# Patient Record
Sex: Male | Born: 1950 | Race: Black or African American | Hispanic: No | Marital: Married | Smoking: Never smoker
Health system: Southern US, Community
[De-identification: ages and names within clinical notes are randomized; demographics above are authoritative.]

## PROBLEM LIST (undated history)

## (undated) DIAGNOSIS — U071 COVID-19: Secondary | ICD-10-CM

## (undated) DIAGNOSIS — I10 Essential (primary) hypertension: Secondary | ICD-10-CM

## (undated) DIAGNOSIS — N183 Chronic kidney disease, stage 3 unspecified: Secondary | ICD-10-CM

## (undated) DIAGNOSIS — I739 Peripheral vascular disease, unspecified: Secondary | ICD-10-CM

## (undated) DIAGNOSIS — E7800 Pure hypercholesterolemia, unspecified: Secondary | ICD-10-CM

## (undated) DIAGNOSIS — C61 Malignant neoplasm of prostate: Secondary | ICD-10-CM

## (undated) HISTORY — PX: THYROIDECTOMY: SHX17

## (undated) HISTORY — PX: HERNIA REPAIR: SHX51

## (undated) HISTORY — PX: TONSILLECTOMY: SUR1361

## (undated) HISTORY — DX: COVID-19: U07.1

## (undated) HISTORY — DX: Peripheral vascular disease, unspecified: I73.9

---

## 2009-04-29 ENCOUNTER — Inpatient Hospital Stay (HOSPITAL_COMMUNITY): Admission: EM | Admit: 2009-04-29 | Discharge: 2009-05-01 | Payer: Self-pay | Admitting: Emergency Medicine

## 2010-05-14 HISTORY — PX: COLONOSCOPY W/ POLYPECTOMY: SHX1380

## 2010-08-14 LAB — CBC
HCT: 41.7 % (ref 39.0–52.0)
MCHC: 34.8 g/dL (ref 30.0–36.0)
MCV: 94.3 fL (ref 78.0–100.0)
RBC: 4.42 MIL/uL (ref 4.22–5.81)
WBC: 12.2 10*3/uL — ABNORMAL HIGH (ref 4.0–10.5)

## 2010-08-14 LAB — POCT I-STAT, CHEM 8
Calcium, Ion: 1.15 mmol/L (ref 1.12–1.32)
Creatinine, Ser: 1.4 mg/dL (ref 0.4–1.5)
Glucose, Bld: 116 mg/dL — ABNORMAL HIGH (ref 70–99)
Hemoglobin: 14.3 g/dL (ref 13.0–17.0)
TCO2: 31 mmol/L (ref 0–100)

## 2010-08-14 LAB — BASIC METABOLIC PANEL
BUN: 10 mg/dL (ref 6–23)
CO2: 29 mEq/L (ref 19–32)
Calcium: 9.5 mg/dL (ref 8.4–10.5)
Chloride: 99 mEq/L (ref 96–112)
Creatinine, Ser: 1.33 mg/dL (ref 0.4–1.5)
GFR calc Af Amer: >60 mL/min (ref >60)
GFR calc non Af Amer: 55 mL/min — ABNORMAL LOW (ref >60)
Glucose, Bld: 120 mg/dL — ABNORMAL HIGH (ref 70–99)
Potassium: 4.7 mEq/L (ref 3.5–5.1)
Sodium: 136 mEq/L (ref 135–145)

## 2010-08-14 LAB — DIFFERENTIAL
Basophils Absolute: 0 10*3/uL (ref 0.0–0.1)
Basophils Relative: 0 % (ref 0–1)
Eosinophils Absolute: 0 10*3/uL (ref 0.0–0.7)
Eosinophils Relative: 0 % (ref 0–5)
Lymphocytes Relative: 6 % — ABNORMAL LOW (ref 12–46)
Lymphs Abs: 0.8 10*3/uL (ref 0.7–4.0)
Monocytes Absolute: 0.1 10*3/uL (ref 0.1–1.0)
Monocytes Relative: 1 % — ABNORMAL LOW (ref 3–12)
Neutro Abs: 11.3 10*3/uL — ABNORMAL HIGH (ref 1.7–7.7)
Neutrophils Relative %: 93 % — ABNORMAL HIGH (ref 43–77)

## 2010-08-14 LAB — MRSA PCR SCREENING: MRSA by PCR: NEGATIVE

## 2010-08-14 LAB — STREP A DNA PROBE: Group A Strep Probe: NEGATIVE

## 2010-08-27 ENCOUNTER — Emergency Department (HOSPITAL_COMMUNITY)
Admission: EM | Admit: 2010-08-27 | Discharge: 2010-08-27 | Disposition: A | Discharge status: Home / Self Care | Payer: Commercial Managed Care - PPO | Attending: Emergency Medicine | Admitting: Emergency Medicine

## 2010-08-27 DIAGNOSIS — R221 Localized swelling, mass and lump, neck: Secondary | ICD-10-CM | POA: Insufficient documentation

## 2010-08-27 DIAGNOSIS — R040 Epistaxis: Secondary | ICD-10-CM | POA: Insufficient documentation

## 2010-08-27 DIAGNOSIS — R220 Localized swelling, mass and lump, head: Secondary | ICD-10-CM | POA: Insufficient documentation

## 2010-08-27 DIAGNOSIS — I10 Essential (primary) hypertension: Secondary | ICD-10-CM | POA: Insufficient documentation

## 2010-08-27 DIAGNOSIS — E669 Obesity, unspecified: Secondary | ICD-10-CM | POA: Insufficient documentation

## 2010-10-05 ENCOUNTER — Ambulatory Visit (HOSPITAL_COMMUNITY)
Admission: RE | Admit: 2010-10-05 | Discharge: 2010-10-05 | Disposition: A | Discharge status: Home / Self Care | Payer: Commercial Managed Care - PPO | Source: Ambulatory Visit | Attending: Gastroenterology | Admitting: Gastroenterology

## 2010-10-05 DIAGNOSIS — Z1211 Encounter for screening for malignant neoplasm of colon: Secondary | ICD-10-CM | POA: Insufficient documentation

## 2010-10-05 DIAGNOSIS — I10 Essential (primary) hypertension: Secondary | ICD-10-CM | POA: Insufficient documentation

## 2010-10-12 NOTE — Op Note (Signed)
  NAME:  Kevin Merritt, SCERBO NO.:  [id number]  MEDICAL RECORD NO.:  [id number]           PATIENT TYPE:  O  LOCATION:  [facility]                         FACILITY:  [facility]  PHYSICIAN:  Danise Edge, M.D.   DATE OF BIRTH:  [birth year]  DATE OF PROCEDURE:  10/05/2010 DATE OF DISCHARGE:                              OPERATIVE REPORT   HISTORY:  Mr. [name] is a 60 year old male born 02/02/1951. The patient is scheduled to undergo his first screening colonoscopy with polypectomy to prevent colon cancer.  ENDOSCOPIST:  Danise Edge, M.D.  PREMEDICATION:  Versed 10 mg, fentanyl 100 mcg.  PROCEDURE IN DETAIL:  After obtaining informed consent, the patient was placed in the left lateral decubitus position.  Anal inspection and digital rectal examination were normal.  The Pentax pediatric colonoscope was introduced into the rectum and advanced to the cecum without difficulty.  A normal-appearing ileocecal valve and appendiceal orifice were identified.  The ileocecal valve was intubated and the distal ileum inspected.  Colonic preparation for the examination today was good.  Rectum normal.  Retroflex view of the distal rectum normal. Sigmoid colon and descending colon normal. Splenic flexure normal. Transverse colon normal. Hepatic flexure normal. Ascending colon normal. Cecum and ileocecal valve normal.  ASSESSMENT:  Normal screening proctocolonoscopy to the cecum.  RECOMMENDATIONS:  Repeat screening colonoscopy in 10 years.          ______________________________ Danise Edge, M.D.     MJ/MEDQ  D:  10/05/2010  T:  10/05/2010  Job:  [id number]  cc:   Deirdre Peer. Polite, M.D.  Electronically Signed by Danise Edge M.D. on 10/12/2010 05:28:35 PM

## 2011-06-09 ENCOUNTER — Other Ambulatory Visit: Payer: Self-pay

## 2011-06-09 ENCOUNTER — Emergency Department (HOSPITAL_COMMUNITY)
Admission: EM | Admit: 2011-06-09 | Discharge: 2011-06-09 | Disposition: A | Discharge status: Home / Self Care | Payer: Commercial Managed Care - PPO | Attending: Emergency Medicine | Admitting: Emergency Medicine

## 2011-06-09 ENCOUNTER — Encounter (HOSPITAL_COMMUNITY): Payer: Self-pay | Admitting: *Deleted

## 2011-06-09 DIAGNOSIS — Z79899 Other long term (current) drug therapy: Secondary | ICD-10-CM | POA: Insufficient documentation

## 2011-06-09 DIAGNOSIS — R51 Headache: Secondary | ICD-10-CM

## 2011-06-09 DIAGNOSIS — E7800 Pure hypercholesterolemia, unspecified: Secondary | ICD-10-CM | POA: Insufficient documentation

## 2011-06-09 DIAGNOSIS — I10 Essential (primary) hypertension: Secondary | ICD-10-CM | POA: Insufficient documentation

## 2011-06-09 HISTORY — DX: Essential (primary) hypertension: I10

## 2011-06-09 HISTORY — DX: Pure hypercholesterolemia, unspecified: E78.00

## 2011-06-09 LAB — BASIC METABOLIC PANEL
CO2: 28 mEq/L (ref 19–32)
Calcium: 9.6 mg/dL (ref 8.4–10.5)
Chloride: 100 mEq/L (ref 96–112)
Potassium: 4 mEq/L (ref 3.5–5.1)
Sodium: 137 mEq/L (ref 135–145)

## 2011-06-09 LAB — URINALYSIS, ROUTINE W REFLEX MICROSCOPIC
Glucose, UA: NEGATIVE mg/dL
Leukocytes, UA: NEGATIVE
Protein, ur: NEGATIVE mg/dL
Specific Gravity, Urine: 1.025 (ref 1.005–1.030)

## 2011-06-09 NOTE — ED Provider Notes (Signed)
History     CSN: [id number]  Arrival date & time 06/09/11  1042   First MD Initiated Contact with Patient 06/09/11 1056      Chief Complaint  Patient presents with  . Hypertension    (Consider location/radiation/quality/duration/timing/severity/associated sxs/prior treatment) HPI Patient presents with concerns of hypertension.  The patient has a history of this, is that he has been compliant with his medications, denies any recent changes.  He notes that over the past week, he gradually developed intermittent headache.  He also complains of intermittent palpable pounding in his ears.  He associates each of these changes with elevated blood pressure.  He denies any disorientation, visual changes, nausea, vomiting, chest pain, dyspnea, diarrhea, and gait changes.  No clear alleviating factors, even when the patient takes blood pressure medications.  No clear exacerbating factors either. The patient had a primary care physician appointment yesterday, this was canceled due to the inclement weather Past Medical History  Diagnosis Date  . Hypertension   . High cholesterol     Past Surgical History  Procedure Date  . Thyroidectomy   . Tonsillectomy   . Hernia repair     History reviewed. No pertinent family history.  History  Substance Use Topics  . Smoking status: Never Smoker   . Smokeless tobacco: Never Used  . Alcohol Use: No      Review of Systems  Constitutional:       Per HPI, otherwise negative  HENT:       Per HPI, otherwise negative  Eyes: Negative.   Respiratory:       Per HPI, otherwise negative  Cardiovascular:       Per HPI, otherwise negative  Gastrointestinal: Negative for vomiting.  Genitourinary: Negative.   Musculoskeletal:       Per HPI, otherwise negative  Skin: Negative.   Neurological: Negative for syncope.    Allergies  Shellfish allergy  Home Medications   Current Outpatient Rx  Name Route Sig Dispense Refill  . AMLODIPINE BESYLATE  10 MG PO TABS Oral Take 10 mg by mouth daily.    . ASPIRIN 325 MG PO TABS Oral Take 325 mg by mouth daily.    Marland Kitchen VALSARTAN-HYDROCHLOROTHIAZIDE 320-25 MG PO TABS Oral Take 1 tablet by mouth daily.      BP 148/87  Pulse 73  Temp(Src) 97.5 F (36.4 C) (Oral)  Resp 16  Ht 6' 5.5" (1.969 m)  Wt 260 lb (117.935 kg)  BMI 30.43 kg/m2  SpO2 99%  Physical Exam  Nursing note and vitals reviewed. Constitutional: He is oriented to person, place, and time. He appears well-developed. No distress.  HENT:  Head: Normocephalic and atraumatic.  Eyes: Conjunctivae and EOM are normal.  Fundoscopic exam:      The right eye shows no arteriolar narrowing, no exudate, no hemorrhage and no papilledema.       The left eye shows no arteriolar narrowing, no exudate, no hemorrhage and no papilledema.  Cardiovascular: Normal rate and regular rhythm.   Pulmonary/Chest: Effort normal. No stridor. No respiratory distress.  Abdominal: He exhibits no distension.  Musculoskeletal: He exhibits no edema.  Neurological: He is alert and oriented to person, place, and time. He has normal strength. No cranial nerve deficit or sensory deficit. He displays a negative Romberg sign.  Skin: Skin is warm and dry.  Psychiatric: He has a normal mood and affect.    ED Course  Procedures (including critical care time)  Labs Reviewed  BASIC METABOLIC PANEL -  Abnormal; Notable for the following:    Creatinine, Ser 1.41 (*)    GFR calc non Af Amer 52 (*)    GFR calc Af Amer 60 (*)    All other components within normal limits  URINALYSIS, ROUTINE W REFLEX MICROSCOPIC   No results found.   No diagnosis found.   Date: 06/09/2011  Rate: 75  Rhythm: normal sinus rhythm  QRS Axis: normal  Intervals: normal  ST/T Wave abnormalities: normal  Conduction Disutrbances:none  Narrative Interpretation:   Old EKG Reviewed: none available Borderline changes (atrial abnormality?) - Abnormal ECG   MDM  This generally  well-appearing male presents with concerns over hypertension and headache.  On exam he is in no distress, has no overt stigmata of sustained hypertension, with unremarkable vital signs.  The patient's labs are further reassuring for the absence of endorgan effects.  The patient was discharged in stable condition to return to his primary care physicians in 2 days.        Gerhard Munch, MD 06/09/11 1246

## 2011-06-09 NOTE — ED Notes (Signed)
Pt remains in triage at this moment. Not in room 13 yet

## 2011-06-09 NOTE — ED Notes (Signed)
Pt from home c/o elevated BP for the last couple days, had appt with PCP yesterday but rescheduled due to inclement weather, pt also endorses headache "off an on".

## 2011-06-09 NOTE — ED Notes (Signed)
MD (Dr. Lockwood) at bedside. 

## 2011-06-09 NOTE — ED Notes (Signed)
Report HTN x 2 days. Denied any symptoms. Latest BP is 158/88, HR 76, 99% RA, Resp 15. Asymptomatic. Denied any pain, sob. Pt has good perfusion.

## 2011-06-09 NOTE — ED Notes (Signed)
Pt now in room.   

## 2011-06-09 NOTE — ED Notes (Signed)
Pt aware of the need for urine 

## 2011-08-31 ENCOUNTER — Emergency Department (HOSPITAL_BASED_OUTPATIENT_CLINIC_OR_DEPARTMENT_OTHER)
Admission: EM | Admit: 2011-08-31 | Discharge: 2011-08-31 | Disposition: A | Discharge status: Home / Self Care | Payer: Commercial Managed Care - PPO | Attending: Emergency Medicine | Admitting: Emergency Medicine

## 2011-08-31 ENCOUNTER — Encounter (HOSPITAL_BASED_OUTPATIENT_CLINIC_OR_DEPARTMENT_OTHER): Payer: Self-pay | Admitting: *Deleted

## 2011-08-31 ENCOUNTER — Emergency Department (INDEPENDENT_AMBULATORY_CARE_PROVIDER_SITE_OTHER): Payer: Commercial Managed Care - PPO

## 2011-08-31 DIAGNOSIS — M25569 Pain in unspecified knee: Secondary | ICD-10-CM | POA: Insufficient documentation

## 2011-08-31 DIAGNOSIS — E789 Disorder of lipoprotein metabolism, unspecified: Secondary | ICD-10-CM | POA: Insufficient documentation

## 2011-08-31 DIAGNOSIS — M25859 Other specified joint disorders, unspecified hip: Secondary | ICD-10-CM

## 2011-08-31 DIAGNOSIS — M25561 Pain in right knee: Secondary | ICD-10-CM

## 2011-08-31 DIAGNOSIS — I10 Essential (primary) hypertension: Secondary | ICD-10-CM | POA: Insufficient documentation

## 2011-08-31 DIAGNOSIS — Z79899 Other long term (current) drug therapy: Secondary | ICD-10-CM | POA: Insufficient documentation

## 2011-08-31 DIAGNOSIS — Z7982 Long term (current) use of aspirin: Secondary | ICD-10-CM | POA: Insufficient documentation

## 2011-08-31 DIAGNOSIS — R209 Unspecified disturbances of skin sensation: Secondary | ICD-10-CM | POA: Insufficient documentation

## 2011-08-31 DIAGNOSIS — M25469 Effusion, unspecified knee: Secondary | ICD-10-CM | POA: Insufficient documentation

## 2011-08-31 DIAGNOSIS — M79609 Pain in unspecified limb: Secondary | ICD-10-CM

## 2011-08-31 DIAGNOSIS — M25559 Pain in unspecified hip: Secondary | ICD-10-CM

## 2011-08-31 MED ORDER — OXYCODONE-ACETAMINOPHEN 5-325 MG PO TABS: 1.0000 | ORAL_TABLET | ORAL | Status: AC | PRN

## 2011-08-31 MED ORDER — MELOXICAM 7.5 MG PO TABS: 7.5000 mg | ORAL_TABLET | Freq: Every day | ORAL | Status: AC

## 2011-08-31 NOTE — ED Provider Notes (Signed)
History     CSN: [id number]  Arrival date & time 08/31/11  0840   First MD Initiated Contact with Patient 08/31/11 [phone]      Chief Complaint  Patient presents with  . Leg Pain    right hip, knee and lower leg pain    (Consider location/radiation/quality/duration/timing/severity/associated sxs/prior treatment) Patient is a 61 y.o. male presenting with leg pain. The history is provided by the patient.  Leg Pain   He has been having pain in his right knee and right hip for the last 3 weeks. He has noted some intermittent swelling of his right knee. His pain is worse with walking and he does notice that his leg gets stiff sometimes. It is also noted some numbness in the first, second, and third toes of the right foot. Has not had any weakness. His job does involve a lot of standing and he has a history of sports injuries to his knee in the past but no recent trauma. Pain is moderate to severe. Current pain is rated at 8/10. Last night he was having difficulty sleeping because of pain. He is tight he is seeing a nonprescription knee support but it did not help and he stated that it felt like his knee was going to pop.  Past Medical History  Diagnosis Date  . Hypertension   . High cholesterol     Past Surgical History  Procedure Date  . Thyroidectomy   . Tonsillectomy   . Hernia repair     No family history on file.  History  Substance Use Topics  . Smoking status: Never Smoker   . Smokeless tobacco: Never Used  . Alcohol Use: No      Review of Systems  All other systems reviewed and are negative.    Allergies  Shellfish allergy  Home Medications   Current Outpatient Rx  Name Route Sig Dispense Refill  . LISINOPRIL 10 MG PO TABS Oral Take 10 mg by mouth daily.    Marland Kitchen AMLODIPINE BESYLATE 10 MG PO TABS Oral Take 10 mg by mouth daily.    . ASPIRIN 325 MG PO TABS Oral Take 325 mg by mouth daily.    Marland Kitchen VALSARTAN-HYDROCHLOROTHIAZIDE 320-25 MG PO TABS Oral Take 1 tablet by  mouth daily.      BP 166/98  Pulse 72  Temp(Src) 97.6 F (36.4 C) (Oral)  Resp 100  Ht 6\' 7"  (2.007 m)  Wt 260 lb (117.935 kg)  BMI 29.29 kg/m2  SpO2 100%  Physical Exam  Nursing note and vitals reviewed.  61 year old male who is resting complaint in no acute distress. Vital signs are significant for mild hypertension with blood pressure 166/98. Oxygen saturation is 100% which is normal. Head is normocephalic and atraumatic. PERRLA, EOMI. Oropharynx is clear. Neck is nontender and supple. Back is nontender. There's negative straight leg raise. Lungs are clear without rales, wheezes, rhonchi. Heart has regular rate and rhythm without murmur. Abdomen is soft, flat, nontender without masses or hepatosplenomegaly. Extremities: There is a small effusion present in the right knee. Full passive range of motion is present in the right hip and right knee. No crepitus is felt. There is a negative Lachman and McMurray's tests. Distal pulses are strong and capillary refill is prompt. Skin is warm and dry without rash. Neurologic: Mental status is normal, cranial nerves are intact, there are no focal motor or sensory deficits. Specifically, pinprick sensation in the toes of his right foot is normal.  ED Course  Procedures (including critical care time)  Results for orders placed during the hospital encounter of 06/09/11  BASIC METABOLIC PANEL      Component Value Range   Sodium 137  135 - 145 (mEq/L)   Potassium 4.0  3.5 - 5.1 (mEq/L)   Chloride 100  96 - 112 (mEq/L)   CO2 28  19 - 32 (mEq/L)   Glucose, Bld 93  70 - 99 (mg/dL)   BUN 19  6 - 23 (mg/dL)   Creatinine, Ser 1.61 (*) 0.50 - 1.35 (mg/dL)   Calcium 9.6  8.4 - 09.6 (mg/dL)   GFR calc non Af Amer 52 (*) >90 (mL/min)   GFR calc Af Amer 60 (*) >90 (mL/min)  URINALYSIS, ROUTINE W REFLEX MICROSCOPIC      Component Value Range   Color, Urine YELLOW  YELLOW    APPearance CLEAR  CLEAR    Specific Gravity, Urine 1.025  1.005 - 1.030    pH 5.5   5.0 - 8.0    Glucose, UA NEGATIVE  NEGATIVE (mg/dL)   Hgb urine dipstick NEGATIVE  NEGATIVE    Bilirubin Urine NEGATIVE  NEGATIVE    Ketones, ur NEGATIVE  NEGATIVE (mg/dL)   Protein, ur NEGATIVE  NEGATIVE (mg/dL)   Urobilinogen, UA 0.2  0.0 - 1.0 (mg/dL)   Nitrite NEGATIVE  NEGATIVE    Leukocytes, UA NEGATIVE  NEGATIVE    Dg Hip Complete Right  08/31/2011  *RADIOLOGY REPORT*  Clinical Data: Leg pain  RIGHT HIP - COMPLETE 2+ VIEW  Comparison: None.  Findings: Three views of the right hip submitted.  No acute fracture or subluxation.  Minimal narrowing superior joint space bilateral hip joints.  SI joints are unremarkable.  IMPRESSION: No acute fracture or subluxation.  Minimal narrowing of superior joint space bilateral hip joints.  Original Report Authenticated By: Natasha Mead, M.D.   Dg Knee 1-2 Views Right  08/31/2011  *RADIOLOGY REPORT*  Clinical Data: Knee pain  RIGHT KNEE - 1-2 VIEW  Comparison: None.  Findings: Two views of the right knee submitted.  No acute fracture or subluxation.  Mild narrowing of medial joint compartment. Minimal spurring of medial tibial plateau.  No joint effusion. Tiny probable tendinous calcifications are noted adjacent to lateral femoral condyle.  IMPRESSION: No acute fracture or subluxation.  Mild degenerative changes as described above.  No joint effusion.  Original Report Authenticated By: Natasha Mead, M.D.   X-rays show only mild degenerative changes. He'll be given a trial of NSAIDs and is given a prescription for meloxicam. He's also given a prescription for Percocet for pain. He sees his knee immobilizer on an as-needed basis.   1. Pain in right knee   2. Hypertension       MDM  Knee and hip pain which are most likely secondary to degenerative joint disease. He could have a meniscus tear in his knee as well. Plain x-rays will be obtained and I anticipate sending him home with a knee immobilizer for comfort and referral to  orthopedics.        Dione Booze, MD 08/31/11 1044

## 2011-08-31 NOTE — ED Notes (Signed)
Patient states he has right knee pain intermittently for a long time.  Over the last 2 weeks pain is in his right hip, right knee, right medial calf with numbness in his right foot.  No known injury.  Stands and works long hours at work.

## 2011-08-31 NOTE — Discharge Instructions (Signed)
Wear the immobilizer as needed. Talk with your doctor about possible referral to an orthopedic Dr. or sports medicine specialist.  Arthralgia Your caregiver has diagnosed you as suffering from an arthralgia. Arthralgia means there is pain in a joint. This can come from many reasons including:  Bruising the joint which causes soreness (inflammation) in the joint.   Wear and tear on the joints which occur as we grow older (osteoarthritis).   Overusing the joint.   Various forms of arthritis.   Infections of the joint.  Regardless of the cause of pain in your joint, most of these different pains respond to anti-inflammatory drugs and rest. The exception to this is when a joint is infected, and these cases are treated with antibiotics, if it is a bacterial infection. HOME CARE INSTRUCTIONS   Rest the injured area for as long as directed by your caregiver. Then slowly start using the joint as directed by your caregiver and as the pain allows. Crutches as directed may be useful if the ankles, knees or hips are involved. If the knee was splinted or casted, continue use and care as directed. If an stretchy or elastic wrapping bandage has been applied today, it should be removed and re-applied every 3 to 4 hours. It should not be applied tightly, but firmly enough to keep swelling down. Watch toes and feet for swelling, bluish discoloration, coldness, numbness or excessive pain. If any of these problems (symptoms) occur, remove the ace bandage and re-apply more loosely. If these symptoms persist, contact your caregiver or return to this location.   For the first 24 hours, keep the injured extremity elevated on pillows while lying down.   Apply ice for 15 to 20 minutes to the sore joint every couple hours while awake for the first half day. Then 3 to 4 times per day for the first 48 hours. Put the ice in a plastic bag and place a towel between the bag of ice and your skin.   Wear any splinting, casting,  elastic bandage applications, or slings as instructed.   Only take over-the-counter or prescription medicines for pain, discomfort, or fever as directed by your caregiver. Do not use aspirin immediately after the injury unless instructed by your physician. Aspirin can cause increased bleeding and bruising of the tissues.   If you were given crutches, continue to use them as instructed and do not resume weight bearing on the sore joint until instructed.  Persistent pain and inability to use the sore joint as directed for more than 2 to 3 days are warning signs indicating that you should see a caregiver for a follow-up visit as soon as possible. Initially, a hairline fracture (break in bone) may not be evident on X-rays. Persistent pain and swelling indicate that further evaluation, non-weight bearing or use of the joint (use of crutches or slings as instructed), or further X-rays are indicated. X-rays may sometimes not show a small fracture until a week or 10 days later. Make a follow-up appointment with your own caregiver or one to whom we have referred you. A radiologist (specialist in reading X-rays) may read your X-rays. Make sure you know how you are to obtain your X-ray results. Do not assume everything is normal if you do not hear from [place]. SEEK MEDICAL CARE IF: Bruising, swelling, or pain increases. SEEK IMMEDIATE MEDICAL CARE IF:   Your fingers or toes are numb or blue.   The pain is not responding to medications and continues to stay  the same or get worse.   The pain in your joint becomes severe.   You develop a fever over 102 F (38.9 C).   It becomes impossible to move or use the joint.  MAKE SURE YOU:   Understand these instructions.   Will watch your condition.   Will get help right away if you are not doing well or get worse.  Document Released: 04/30/2005 Document Revised: 04/19/2011 Document Reviewed: 12/17/2007 [facility] Patient Information 2012 [place],  [place].  Meloxicam tablets What is this medicine? MELOXICAM (mel OX i cam) is a non-steroidal anti-inflammatory drug (NSAID). It is used to reduce swelling and to treat pain. It may be used for osteoarthritis, rheumatoid arthritis, or juvenile rheumatoid arthritis. This medicine may be used for other purposes; ask your health care provider or pharmacist if you have questions. What should I tell my health care provider before I take this medicine? They need to know if you have any of these conditions: -asthma -cigarette smoker -coronary artery bypass graft (CABG) surgery within the past 2 weeks -drink more than 3 alcohol-containing drinks a day -heart disease or circulation problems such as heart failure or leg edema (fluid retention) -hemophilia or bleeding problems -high blood pressure -kidney disease -liver disease -stomach bleeding or ulcers -an unusual or allergic reaction to meloxicam, aspirin, other NSAIDs, other medicines, foods, dyes, or preservatives -pregnant or trying to get pregnant -breast-feeding How should I use this medicine? Take this medicine by mouth with a full glass of water. Follow the directions on the prescription label. Take this medicine in an upright or sitting position. If possible take bedtime doses at least 10 minutes before lying down. You can take it with or without food. If it upsets your stomach, take it with food. Take your medicine at regular intervals. Do not take it more often than directed. A special MedGuide will be given to you by the pharmacist with each prescription and refill. Be sure to read this information carefully each time. Talk to your pediatrician regarding the use of this medicine in children. Special care may be needed. Elderly patients over 32 years old may have a stronger reaction to this medicine and need smaller doses. Overdosage: If you think you have taken too much of this medicine contact a poison control center or emergency room at  once. NOTE: This medicine is only for you. Do not share this medicine with others. What if I miss a dose? If you miss a dose, take it as soon as you can. If it is almost time for your next dose, take only that dose. Do not take double or extra doses. What may interact with this medicine? -alcohol -aspirin -cidofovir -diuretics -lithium -medicines for high blood pressure -methotrexate -other drugs for inflammation like ketorolac, ibuprofen, and prednisone -pemetrexed -warfarin This list may not describe all possible interactions. Give your health care provider a list of all the medicines, herbs, non-prescription drugs, or dietary supplements you use. Also tell them if you smoke, drink alcohol, or use illegal drugs. Some items may interact with your medicine. What should I watch for while using this medicine? Tell your doctor or healthcare professional if your pain does not get better. Talk to your doctor before taking another medicine for pain. Do not treat yourself. This medicine does not prevent heart attack or stroke. If you take aspirin to prevent heart attack or stroke, talk with your doctor or health care professional. Do not take medicines such as ibuprofen and naproxen with this  medicine. Side effects such as stomach upset, nausea, or ulcers may be more likely to occur. Many medicines available without a prescription should not be taken with this medicine. What side effects may I notice from receiving this medicine? Side effects that you should report to your doctor or health care professional as soon as possible: -black or bloody stools, blood in the urine or vomit -blurred vision -chest pain -difficulty breathing or wheezing -nausea or vomiting -skin rash, skin redness, blistering or peeling skin, hives, or itching -slurred speech or weakness on one side of the body -swelling of eyelids, throat, lips -unexplained weight gain or swelling -unusually weak or tired -yellowing of  eyes or skin Side effects that usually do not require medical attention (report to your doctor or health care professional if they continue or are bothersome): -constipation or diarrhea -dizziness -gas or heartburn -stomach pain This list may not describe all possible side effects. Call your doctor for medical advice about side effects. You may report side effects to FDA at 1-800-FDA-1088. Where should I keep my medicine? Keep out of the reach of children. Store at room temperature between 15 and 30 degrees C (59 and 86 degrees F). Protect from moisture. Keep container tightly closed. Throw away any unused medicine after the expiration date. NOTE: This sheet is a summary. It may not cover all possible information. If you have questions about this medicine, talk to your doctor, pharmacist, or health care provider.  2012, [employer]. (08/22/2009 9:15:42 PM)  Acetaminophen; Oxycodone tablets What is this medicine? ACETAMINOPHEN; OXYCODONE (a set a MEE noe fen; ox i KOE done) is a pain reliever. It is used to treat mild to moderate pain. This medicine may be used for other purposes; ask your health care provider or pharmacist if you have questions. What should I tell my health care provider before I take this medicine? They need to know if you have any of these conditions: -brain tumor -Crohn's disease, inflammatory bowel disease, or ulcerative colitis -drink more than 3 alcohol containing drinks per day -drug abuse or addiction -head injury -heart or circulation problems -kidney disease or problems going to the bathroom -liver disease -lung disease, asthma, or breathing problems -an unusual or allergic reaction to acetaminophen, oxycodone, other opioid analgesics, other medicines, foods, dyes, or preservatives -pregnant or trying to get pregnant -breast-feeding How should I use this medicine? Take this medicine by mouth with a full glass of water. Follow the directions on the  prescription label. Take your medicine at regular intervals. Do not take your medicine more often than directed. Talk to your pediatrician regarding the use of this medicine in children. Special care may be needed. Patients over 71 years old may have a stronger reaction and need a smaller dose. Overdosage: If you think you have taken too much of this medicine contact a poison control center or emergency room at once. NOTE: This medicine is only for you. Do not share this medicine with others. What if I miss a dose? If you miss a dose, take it as soon as you can. If it is almost time for your next dose, take only that dose. Do not take double or extra doses. What may interact with this medicine? -alcohol or medicines that contain alcohol -antihistamines -barbiturates like amobarbital, butalbital, butabarbital, methohexital, pentobarbital, phenobarbital, thiopental, and secobarbital -benztropine -drugs for bladder problems like solifenacin, trospium, oxybutynin, tolterodine, hyoscyamine, and methscopolamine -drugs for breathing problems like ipratropium and tiotropium -drugs for certain stomach or intestine problems  like propantheline, homatropine methylbromide, glycopyrrolate, atropine, belladonna, and dicyclomine -general anesthetics like etomidate, ketamine, nitrous oxide, propofol, desflurane, enflurane, halothane, isoflurane, and sevoflurane -medicines for depression, anxiety, or psychotic disturbances -medicines for pain like codeine, morphine, pentazocine, buprenorphine, butorphanol, nalbuphine, tramadol, and propoxyphene -medicines for sleep -muscle relaxants -naltrexone -phenothiazines like perphenazine, thioridazine, chlorpromazine, mesoridazine, fluphenazine, prochlorperazine, promazine, and trifluoperazine -scopolamine -trihexyphenidyl This list may not describe all possible interactions. Give your health care provider a list of all the medicines, herbs, non-prescription drugs, or  dietary supplements you use. Also tell them if you smoke, drink alcohol, or use illegal drugs. Some items may interact with your medicine. What should I watch for while using this medicine? Tell your doctor or health care professional if your pain does not go away, if it gets worse, or if you have new or a different type of pain. You may develop tolerance to the medicine. Tolerance means that you will need a higher dose of the medication for pain relief. Tolerance is normal and is expected if you take this medicine for a long time. Do not suddenly stop taking your medicine because you may develop a severe reaction. Your body becomes used to the medicine. This does NOT mean you are addicted. Addiction is a behavior related to getting and using a drug for a nonmedical reason. If you have pain, you have a medical reason to take pain medicine. Your doctor will tell you how much medicine to take. If your doctor wants you to stop the medicine, the dose will be slowly lowered over time to avoid any side effects. You may get drowsy or dizzy. Do not drive, use machinery, or do anything that needs mental alertness until you know how this medicine affects you. Do not stand or sit up quickly, especially if you are an older patient. This reduces the risk of dizzy or fainting spells. Alcohol may interfere with the effect of this medicine. Avoid alcoholic drinks. The medicine will cause constipation. Try to have a bowel movement at least every 2 to 3 days. If you do not have a bowel movement for 3 days, call your doctor or health care professional. Do not take Tylenol (acetaminophen) or medicines that have acetaminophen with this medicine. Too much acetaminophen can be very dangerous. Many nonprescription medicines contain acetaminophen. Always read the labels carefully to avoid taking more acetaminophen. What side effects may I notice from receiving this medicine? Side effects that you should report to your doctor or  health care professional as soon as possible: -allergic reactions like skin rash, itching or hives, swelling of the face, lips, or tongue -breathing difficulties, wheezing -confusion -light headedness or fainting spells -severe stomach pain -yellowing of the skin or the whites of the eyes Side effects that usually do not require medical attention (report to your doctor or health care professional if they continue or are bothersome): -dizziness -drowsiness -nausea -vomiting This list may not describe all possible side effects. Call your doctor for medical advice about side effects. You may report side effects to FDA at 1-800-FDA-1088. Where should I keep my medicine? Keep out of the reach of children. This medicine can be abused. Keep your medicine in a safe place to protect it from theft. Do not share this medicine with anyone. Selling or giving away this medicine is dangerous and against the law. Store at room temperature between 20 and 25 degrees C (68 and 77 degrees F). Keep container tightly closed. Protect from light. Flush any unused medicines down the  toilet. Do not use the medicine after the expiration date. NOTE: This sheet is a summary. It may not cover all possible information. If you have questions about this medicine, talk to your doctor, pharmacist, or health care provider.  2012, [employer] Standard. (03/29/2008 10:01:21 AM)

## 2011-09-04 ENCOUNTER — Other Ambulatory Visit: Payer: Self-pay | Admitting: Orthopedic Surgery

## 2011-09-04 DIAGNOSIS — M25569 Pain in unspecified knee: Secondary | ICD-10-CM

## 2011-09-10 ENCOUNTER — Other Ambulatory Visit: Payer: Commercial Managed Care - PPO

## 2013-08-18 ENCOUNTER — Ambulatory Visit
Admission: RE | Admit: 2013-08-18 | Discharge: 2013-08-18 | Disposition: A | Discharge status: Home / Self Care | Payer: Commercial Managed Care - PPO | Source: Ambulatory Visit | Attending: Internal Medicine | Admitting: Internal Medicine

## 2013-08-18 ENCOUNTER — Other Ambulatory Visit: Payer: Self-pay | Admitting: Internal Medicine

## 2013-08-18 DIAGNOSIS — J069 Acute upper respiratory infection, unspecified: Secondary | ICD-10-CM

## 2013-09-16 ENCOUNTER — Encounter (HOSPITAL_COMMUNITY): Payer: Self-pay | Admitting: Emergency Medicine

## 2013-09-16 ENCOUNTER — Emergency Department (HOSPITAL_COMMUNITY): Payer: Commercial Managed Care - PPO

## 2013-09-16 ENCOUNTER — Emergency Department (HOSPITAL_COMMUNITY)
Admission: EM | Admit: 2013-09-16 | Discharge: 2013-09-16 | Disposition: A | Discharge status: Other Acute Inpatient Hospital | Payer: Commercial Managed Care - PPO | Source: Home / Self Care | Attending: Emergency Medicine | Admitting: Emergency Medicine

## 2013-09-16 ENCOUNTER — Emergency Department (HOSPITAL_COMMUNITY)
Admission: EM | Admit: 2013-09-16 | Discharge: 2013-09-16 | Disposition: A | Discharge status: Home / Self Care | Payer: Commercial Managed Care - PPO | Attending: Emergency Medicine | Admitting: Emergency Medicine

## 2013-09-16 DIAGNOSIS — I10 Essential (primary) hypertension: Secondary | ICD-10-CM | POA: Insufficient documentation

## 2013-09-16 DIAGNOSIS — Z862 Personal history of diseases of the blood and blood-forming organs and certain disorders involving the immune mechanism: Secondary | ICD-10-CM | POA: Insufficient documentation

## 2013-09-16 DIAGNOSIS — R079 Chest pain, unspecified: Secondary | ICD-10-CM

## 2013-09-16 DIAGNOSIS — R0789 Other chest pain: Secondary | ICD-10-CM | POA: Insufficient documentation

## 2013-09-16 DIAGNOSIS — I209 Angina pectoris, unspecified: Secondary | ICD-10-CM

## 2013-09-16 DIAGNOSIS — Z7982 Long term (current) use of aspirin: Secondary | ICD-10-CM | POA: Insufficient documentation

## 2013-09-16 DIAGNOSIS — Z8639 Personal history of other endocrine, nutritional and metabolic disease: Secondary | ICD-10-CM | POA: Insufficient documentation

## 2013-09-16 DIAGNOSIS — Z79899 Other long term (current) drug therapy: Secondary | ICD-10-CM | POA: Insufficient documentation

## 2013-09-16 LAB — CBC WITH DIFFERENTIAL/PLATELET
BASOS ABS: 0 10*3/uL (ref 0.0–0.1)
BASOS PCT: 0 % (ref 0–1)
Eosinophils Absolute: 0.2 10*3/uL (ref 0.0–0.7)
Eosinophils Relative: 4 % (ref 0–5)
HEMATOCRIT: 41 % (ref 39.0–52.0)
Hemoglobin: 14 g/dL (ref 13.0–17.0)
Lymphocytes Relative: 22 % (ref 12–46)
Lymphs Abs: 1.2 10*3/uL (ref 0.7–4.0)
MCH: 31.1 pg (ref 26.0–34.0)
MCHC: 34.1 g/dL (ref 30.0–36.0)
MCV: 91.1 fL (ref 78.0–100.0)
MONO ABS: 0.4 10*3/uL (ref 0.1–1.0)
Monocytes Relative: 7 % (ref 3–12)
NEUTROS PCT: 67 % (ref 43–77)
Neutro Abs: 3.8 10*3/uL (ref 1.7–7.7)
Platelets: 174 10*3/uL (ref 150–400)
RBC: 4.5 MIL/uL (ref 4.22–5.81)
RDW: 12.8 % (ref 11.5–15.5)
WBC: 5.7 10*3/uL (ref 4.0–10.5)

## 2013-09-16 LAB — BASIC METABOLIC PANEL
BUN: 10 mg/dL (ref 6–23)
CO2: 28 mEq/L (ref 19–32)
CREATININE: 1.14 mg/dL (ref 0.50–1.35)
Calcium: 9.7 mg/dL (ref 8.4–10.5)
Chloride: 100 mEq/L (ref 96–112)
GFR, EST AFRICAN AMERICAN: 76 mL/min — AB (ref >90)
GFR, EST NON AFRICAN AMERICAN: 66 mL/min — AB (ref >90)
Glucose, Bld: 98 mg/dL (ref 70–99)
Potassium: 4.8 mEq/L (ref 3.7–5.3)
Sodium: 140 mEq/L (ref 137–147)

## 2013-09-16 LAB — I-STAT CG4 LACTIC ACID, ED: Lactic Acid, Venous: 0.76 mmol/L (ref 0.5–2.2)

## 2013-09-16 LAB — TROPONIN I: Troponin I: <0.3 ng/mL (ref <0.30)

## 2013-09-16 LAB — I-STAT TROPONIN, ED: Troponin i, poc: 0.02 ng/mL (ref 0.00–0.08)

## 2013-09-16 LAB — PRO B NATRIURETIC PEPTIDE: Pro B Natriuretic peptide (BNP): 191 pg/mL — ABNORMAL HIGH (ref 0–125)

## 2013-09-16 MED ORDER — LABETALOL HCL 5 MG/ML IV SOLN
10.0000 mg | Freq: Once | INTRAVENOUS | Status: AC
  Administered 2013-09-16: 10 mg via INTRAVENOUS
  Filled 2013-09-16: qty 4

## 2013-09-16 MED ORDER — LISINOPRIL 10 MG PO TABS
10.0000 mg | ORAL_TABLET | Freq: Once | ORAL | Status: AC
  Administered 2013-09-16: 10 mg via ORAL
  Filled 2013-09-16: qty 1

## 2013-09-16 MED ORDER — ASPIRIN 81 MG PO CHEW
CHEWABLE_TABLET | ORAL | Status: AC
  Filled 2013-09-16: qty 4

## 2013-09-16 MED ORDER — SODIUM CHLORIDE 0.9 % IV SOLN
INTRAVENOUS | Status: DC
  Administered 2013-09-16: 11:00:00 via INTRAVENOUS

## 2013-09-16 MED ORDER — ASPIRIN 81 MG PO CHEW
324.0000 mg | CHEWABLE_TABLET | Freq: Once | ORAL | Status: AC
  Administered 2013-09-16: 324 mg via ORAL

## 2013-09-16 NOTE — ED Notes (Signed)
MD at bedside. 

## 2013-09-16 NOTE — ED Provider Notes (Addendum)
Chief Complaint   Chief Complaint  Patient presents with  . Chest Pain    History of Present Illness    Kevin Merritt is a 63 year old male who comes in today because he's concerned about his blood pressure, although he mentions as one of his complaints that he's had chest tightness and discomfort off-and-on for the past 4-5 months. Episodes last about 3 minutes at a time and are nonexertional. Sometimes the pain is jabbing. The pain is localized to the left submammary area without radiation. It is not associated with diaphoresis but sometimes with nausea. He also has shortness of breath although not necessarily with the chest discomfort. He's been hypertensive for about 20 years. Currently he is on amlodipine 10 mg per day, Diovan/HCTZ once a day, and lisinopril 10 mg per day. He denies any medication side effects. He notes occasional sensation of pounding in the chest with rapid, irregular heartbeats. Also for the past 2-3 days he's had a pounding headache. He feels dizzy when he stands up suddenly. He's had some mild URI symptoms with nasal congestion, rhinorrhea, and cough. He states that 2 weeks ago he had a chest x-ray which showed an enlarged heart. His eyes have been red, itchy, and watery.  Review of Systems    Other than noted above, the patient denies any of the following symptoms. Systemic:  No fever or chills. Pulmonary:  No cough, wheezing, shortness of breath, sputum production, hemoptysis. Cardiac:  No palpitations, rapid heartbeat, dizziness, presyncope or syncope. GI:  No abdominal pain, heartburn, nausea, or vomiting. Ext:  No leg pain or swelling.  [place]    Past medical history, family history, social history, meds, and allergies were reviewed. 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 In addition to high blood pressure he also has erectile dysfunction, hypercholesterolemia, and BPH. He denies cigarette smoker, and there is no family history of heart disease.  Physical Exam      Vital signs:  BP 186/106  Pulse 85  Temp(Src) 98.2 F (36.8 C) (Oral)  Resp 18  SpO2 100% Gen:  Alert, oriented, in no distress, skin warm and dry. Eye:  PERRL, lids and conjunctivas normal.  Sclera non-icteric. ENT:  Mucous membranes moist, pharynx clear. Neck:  Supple, no adenopathy or tenderness.  No JVD. Lungs:  Clear to auscultation, no wheezes, rales or rhonchi.  No respiratory distress. Heart:  Regular rhythm.  No gallops, murmers, clicks or rubs. Chest:  No chest wall tenderness. Abdomen:  Soft, nontender, no organomegaly or mass.  Bowel sounds normal.  No pulsatile abdominal mass or bruit. Ext:  No edema.  No calf tenderness and Homann's sign negative.  Pulses full and equal. Skin:  Warm and dry.  No rash.   EKG Results:  Date: 09/16/2013  Rate: 86  Rhythm: normal sinus rhythm  QRS Axis: normal  Intervals: normal  ST/T Wave abnormalities: normal  Conduction Disutrbances:none  Narrative Interpretation: Normal sinus rhythm, normal EKG.  Old EKG Reviewed: none available    Course in Urgent [place]         He was begun on normal saline 50 mL IV, oxygen 2 L per minute via nasal cannula, he was monitored, and given aspirin 325 mg by mouth. He was not given nitroglycerin since he has no chest pain now.  Assessment     The encounter diagnosis was Angina pectoris.  Plan     The patient was transferred to the ED via [facility] in stable condition.  Medical Decision Making:  63 year old male with HT and hypercholesterolemia has a 4 to 5 month history of intermittent non-exertional chest tightness in left submammary area without radiation, sometimes associated with nausea and shortness of breath.  No known cardiac disease. His blood pressure was elevated today at 186/106.  He also notes pounding sensation in chest, headache, and  dizziness.  His EKG was normal.  We will start NS and give ASA.       Harden Mo, MD 09/16/13 1036  Harden Mo, MD 09/16/13 [phone]

## 2013-09-16 NOTE — ED Provider Notes (Addendum)
CSN: [id number]     Arrival date & time 09/16/13  1108 History   First MD Initiated Contact with Patient 09/16/13 1113     Chief Complaint  Patient presents with  . Chest Pain     (Consider location/radiation/quality/duration/timing/severity/associated sxs/prior Treatment) HPI Comments: Patient presents to the ER for valuation of chest pain. Patient was referred to the ER by urgent care. Patient originally went to the urgent care over concerns of elevated blood pressures. Patient reports a 20 year history of hypertension, maintained on multiple medications. He reports that his blood pressure has been running high lately. At times he can feel his pulse in his ears and sometimes his chest. The pain that he experiences is usually brief lived, only describe as "discomfort". No associated shortness of breath or diaphoresis. He does not generally radiate, stays in the left chest region. He has not identified any precipitating factors. After a few minutes the discomfort generally resolves without intervention.  Patient is a 63 y.o. male presenting with chest pain.  Chest Pain   Past Medical History  Diagnosis Date  . Hypertension   . High cholesterol    Past Surgical History  Procedure Laterality Date  . Thyroidectomy    . Tonsillectomy    . Hernia repair     History reviewed. No pertinent family history. History  Substance Use Topics  . Smoking status: Never Smoker   . Smokeless tobacco: Never Used  . Alcohol Use: No    Review of Systems  Cardiovascular: Positive for chest pain.  All other systems reviewed and are negative.     Allergies  Shellfish allergy  Home Medications   Prior to Admission medications   Medication Sig Start Date End Date Taking? Authorizing Provider  amLODipine (NORVASC) 10 MG tablet Take 10 mg by mouth daily.   Yes Historical Provider, MD  aspirin EC 81 MG tablet Take 81 mg by mouth daily.   Yes Historical Provider, MD  Chlorpheniramine-Acetaminophen  (CORICIDIN HBP COLD/FLU PO) Take 2 tablets by mouth every 6 (six) hours as needed (for cold symptoms).   Yes Historical Provider, MD  lisinopril (PRINIVIL,ZESTRIL) 10 MG tablet Take 10 mg by mouth daily.   Yes Historical Provider, MD  Multiple Vitamin (MULTIVITAMIN WITH MINERALS) TABS tablet Take 1 tablet by mouth daily.   Yes Historical Provider, MD  valsartan-hydrochlorothiazide (DIOVAN-HCT) 320-25 MG per tablet Take 1 tablet by mouth daily.   Yes Historical Provider, MD   BP 165/110  Pulse 69  Temp(Src) 98.5 F (36.9 C) (Oral)  Resp 18  SpO2 100% Physical Exam  Constitutional: He is oriented to person, place, and time. He appears well-developed and well-nourished. No distress.  HENT:  Head: Normocephalic and atraumatic.  Right Ear: Hearing normal.  Left Ear: Hearing normal.  Nose: Nose normal.  Mouth/Throat: Oropharynx is clear and moist and mucous membranes are normal.  Eyes: Conjunctivae and EOM are normal. Pupils are equal, round, and reactive to light.  Neck: Normal range of motion. Neck supple.  Cardiovascular: Regular rhythm, S1 normal and S2 normal.  Exam reveals no gallop and no friction rub.   No murmur heard. Pulmonary/Chest: Effort normal and breath sounds normal. No respiratory distress. He exhibits no tenderness.  Abdominal: Soft. Normal appearance and bowel sounds are normal. There is no hepatosplenomegaly. There is no tenderness. There is no rebound, no guarding, no tenderness at McBurney's point and negative Murphy's sign. No hernia.  Musculoskeletal: Normal range of motion.  Neurological: He is alert and oriented  to person, place, and time. He has normal strength. No cranial nerve deficit or sensory deficit. Coordination normal. GCS eye subscore is 4. GCS verbal subscore is 5. GCS motor subscore is 6.  Skin: Skin is warm, dry and intact. No rash noted. No cyanosis.  Psychiatric: He has a normal mood and affect. His speech is normal and behavior is normal. Thought  content normal.    ED Course  Procedures (including critical care time) Labs Review Labs Reviewed  BASIC METABOLIC PANEL - Abnormal; Notable for the following:    GFR calc non Af Amer 66 (*)    GFR calc Af Amer 76 (*)    All other components within normal limits  PRO B NATRIURETIC PEPTIDE - Abnormal; Notable for the following:    Pro B Natriuretic peptide (BNP) 191.0 (*)    All other components within normal limits  CBC WITH DIFFERENTIAL  TROPONIN I  I-STAT CG4 LACTIC ACID, ED  Randolm Idol, ED    Imaging Review Dg Chest 2 View  09/16/2013   CLINICAL DATA:  Chest pain  EXAM: CHEST  2 VIEW  COMPARISON:  08/18/2013  FINDINGS: COPD with apical emphysema. Mild cardiac enlargement without heart failure. Lungs are clear.  IMPRESSION: No active cardiopulmonary disease.   Electronically Signed   By: Franchot Gallo M.D.   On: 09/16/2013 12:04     EKG Interpretation   Date/Time:  Wednesday Sep 16 2013 11:16:48 EDT Ventricular Rate:  70 PR Interval:  201 QRS Duration: 104 QT Interval:  377 QTC Calculation: 407 R Axis:   66 Text Interpretation:  Sinus rhythm Multiple ventricular premature  complexes Borderline ST elevation, anterolateral leads Baseline wander in  lead(s) V1 V2 No significant change since last tracing Confirmed by  Orrie Lascano  MD, [place] [phone]) on 09/16/2013 11:54:47 AM      MDM   Final diagnoses:  Hypertension  Chest pain   Patient presents to the ER for evaluation of elevated blood pressure. He has been noticing elevated blood pressure at home recently, has been treated for high blood pressure for 20 years. Patient also reports chest pain. He has been experiencing intermittent episodes of chest pain that lasts for a minute to 3 minutes at a time and then resolve. The pain is not exertional in nature. This is very atypical. Patient's EKG was unremarkable. Initial troponin was negative. Patient had a repeat troponin which is also negative. It is felt that this  was not consistent with acute coronary syndrome, as it has been ongoing for several months, to be followed up as an outpatient.  Patient was hypertensive here in the ER. He was treated with labetalol with improvement. He was already on Norvasc 10 mg daily. He also takes valsartan hydrochlorothiazide, maximum dose. Patient is on 10 mg of lisinopril. This will be increased to 20 mg daily and is to follow up with primary care doctor in the next several days for a recheck of his blood pressure and further consideration of the recurrent pain.  Addendum: A recheck of vitals at time of discharge reveals his blood pressure is elevated again. Patient additional lisinopril here in the ER and will initiate higher doses in the morning, follow up with primary doctor.  Orpah Greek, MD 09/16/13 1747  Orpah Greek, MD 09/16/13 1754

## 2013-09-16 NOTE — Discharge Instructions (Signed)
We have determined that your problem requires further evaluation in the emergency department.  We will take care of your transport there.  Once at the emergency department, you will be evaluated by a provider and they will order whatever treatment or tests they deem necessary.  We cannot guarantee that they will do any specific test or do any specific treatment.  ° °

## 2013-09-16 NOTE — Discharge Instructions (Signed)
Double your daily dose of lisinopril and schedule followup with your doctor for a recheck in the office in the next several days.  Chest Pain (Nonspecific) It is often hard to give a specific diagnosis for the cause of chest pain. There is always a chance that your pain could be related to something serious, such as a heart attack or a blood clot in the lungs. You need to follow up with your caregiver for further evaluation. CAUSES   Heartburn.  Pneumonia or bronchitis.  Anxiety or stress.  Inflammation around your heart (pericarditis) or lung (pleuritis or pleurisy).  A blood clot in the lung.  A collapsed lung (pneumothorax). It can develop suddenly on its own (spontaneous pneumothorax) or from injury (trauma) to the chest.  Shingles infection (herpes zoster virus). The chest wall is composed of bones, muscles, and cartilage. Any of these can be the source of the pain.  The bones can be bruised by injury.  The muscles or cartilage can be strained by coughing or overwork.  The cartilage can be affected by inflammation and become sore (costochondritis). DIAGNOSIS  Lab tests or other studies, such as X-rays, electrocardiography, stress testing, or cardiac imaging, may be needed to find the cause of your pain.  TREATMENT   Treatment depends on what may be causing your chest pain. Treatment may include:  Acid blockers for heartburn.  Anti-inflammatory medicine.  Pain medicine for inflammatory conditions.  Antibiotics if an infection is present.  You may be advised to change lifestyle habits. This includes stopping smoking and avoiding alcohol, caffeine, and chocolate.  You may be advised to keep your head raised (elevated) when sleeping. This reduces the chance of acid going backward from your stomach into your esophagus.  Most of the time, nonspecific chest pain will improve within 2 to 3 days with rest and mild pain medicine. HOME CARE INSTRUCTIONS   If antibiotics were  prescribed, take your antibiotics as directed. Finish them even if you start to feel better.  For the next few days, avoid physical activities that bring on chest pain. Continue physical activities as directed.  Do not smoke.  Avoid drinking alcohol.  Only take over-the-counter or prescription medicine for pain, discomfort, or fever as directed by your caregiver.  Follow your caregiver's suggestions for further testing if your chest pain does not go away.  Keep any follow-up appointments you made. If you do not go to an appointment, you could develop lasting (chronic) problems with pain. If there is any problem keeping an appointment, you must call to reschedule. SEEK MEDICAL CARE IF:   You think you are having problems from the medicine you are taking. Read your medicine instructions carefully.  Your chest pain does not go away, even after treatment.  You develop a rash with blisters on your chest. SEEK IMMEDIATE MEDICAL CARE IF:   You have increased chest pain or pain that spreads to your arm, neck, jaw, back, or abdomen.  You develop shortness of breath, an increasing cough, or you are coughing up blood.  You have severe back or abdominal pain, feel nauseous, or vomit.  You develop severe weakness, fainting, or chills.  You have a fever. THIS IS AN EMERGENCY. Do not wait to see if the pain will go away. Get medical help at once. Call your local emergency services (911 in [place] Do not drive yourself to the hospital. MAKE SURE YOU:   Understand these instructions.  Will watch your condition.  Will get help right  away if you are not doing well or get worse. Document Released: 02/07/2005 Document Revised: 07/23/2011 Document Reviewed: 12/04/2007 [facility] Patient Information 2014 [place].  Hypertension As your heart beats, it forces blood through your arteries. This force is your blood pressure. If the pressure is too high, it is called hypertension (HTN) or high  blood pressure. HTN is dangerous because you may have it and not know it. High blood pressure may mean that your heart has to work harder to pump blood. Your arteries may be narrow or stiff. The extra work puts you at risk for heart disease, stroke, and other problems.  Blood pressure consists of two numbers, a higher number over a lower, 110/72, for example. It is stated as "110 over 72." The ideal is below 120 for the top number (systolic) and under 80 for the bottom (diastolic). Write down your blood pressure today. You should pay close attention to your blood pressure if you have certain conditions such as:  Heart failure.  Prior heart attack.  Diabetes  Chronic kidney disease.  Prior stroke.  Multiple risk factors for heart disease. To see if you have HTN, your blood pressure should be measured while you are seated with your arm held at the level of the heart. It should be measured at least twice. A one-time elevated blood pressure reading (especially in the Emergency Department) does not mean that you need treatment. There may be conditions in which the blood pressure is different between your right and left arms. It is important to see your caregiver soon for a recheck. Most people have essential hypertension which means that there is not a specific cause. This type of high blood pressure may be lowered by changing lifestyle factors such as:  Stress.  Smoking.  Lack of exercise.  Excessive weight.  Drug/tobacco/alcohol use.  Eating less salt. Most people do not have symptoms from high blood pressure until it has caused damage to the body. Effective treatment can often prevent, delay or reduce that damage. TREATMENT  When a cause has been identified, treatment for high blood pressure is directed at the cause. There are a large number of medications to treat HTN. These fall into several categories, and your caregiver will help you select the medicines that are best for you.  Medications may have side effects. You should review side effects with your caregiver. If your blood pressure stays high after you have made lifestyle changes or started on medicines,   Your medication(s) may need to be changed.  Other problems may need to be addressed.  Be certain you understand your prescriptions, and know how and when to take your medicine.  Be sure to follow up with your caregiver within the time frame advised (usually within two weeks) to have your blood pressure rechecked and to review your medications.  If you are taking more than one medicine to lower your blood pressure, make sure you know how and at what times they should be taken. Taking two medicines at the same time can result in blood pressure that is too low. SEEK IMMEDIATE MEDICAL CARE IF:  You develop a severe headache, blurred or changing vision, or confusion.  You have unusual weakness or numbness, or a faint feeling.  You have severe chest or abdominal pain, vomiting, or breathing problems. MAKE SURE YOU:   Understand these instructions.  Will watch your condition.  Will get help right away if you are not doing well or get worse. Document Released: 04/30/2005 Document  Revised: 07/23/2011 Document Reviewed: 12/19/2007 [facility] Patient Information 2014 [place], [place].

## 2013-09-16 NOTE — ED Notes (Signed)
Pt is a transfer from [facility], per [facility] of c/o central chest pain x2 days, pt reports pain 2/10, pt denies SOB, diaphoresis, nausea, vomiting, weakness, or abd/back pain. [place] KVO, pt alert and oriented x4 pt HTN with a hx of the same

## 2013-09-16 NOTE — ED Notes (Signed)
NOTIFIED DR. POLLINA OF PATIENTS LAB RESULTS OF CG4+ LACTIC ACID ,@17 :13 PM ,09/16/2013.

## 2013-09-16 NOTE — ED Notes (Signed)
Pt comfortable with discharge and follow up instructions. Medication instructions discussed and no prescriptions given. Dr. Betsey Holiday aware of pt's headache and BP, states [place] for discharge.

## 2013-09-16 NOTE — ED Notes (Signed)
C/o 2 day episodic pain in left side of chest . Discomfort free at present. No radiation of pain. Has had to stop and rest more recently due to fatigue. No swelling in feet , no problem w shoes fitting

## 2013-10-01 ENCOUNTER — Ambulatory Visit (INDEPENDENT_AMBULATORY_CARE_PROVIDER_SITE_OTHER): Payer: Commercial Managed Care - PPO | Admitting: Cardiology

## 2013-10-01 ENCOUNTER — Institutional Professional Consult (permissible substitution): Payer: Commercial Managed Care - PPO | Admitting: Cardiology

## 2013-10-01 ENCOUNTER — Encounter (HOSPITAL_COMMUNITY): Payer: Self-pay | Admitting: *Deleted

## 2013-10-01 VITALS — BP 116/60 | HR 74 | Ht 77.0 in | Wt 250.0 lb

## 2013-10-01 DIAGNOSIS — R079 Chest pain, unspecified: Secondary | ICD-10-CM

## 2013-10-01 DIAGNOSIS — E7800 Pure hypercholesterolemia, unspecified: Secondary | ICD-10-CM

## 2013-10-01 DIAGNOSIS — I10 Essential (primary) hypertension: Secondary | ICD-10-CM

## 2013-10-01 DIAGNOSIS — R0609 Other forms of dyspnea: Secondary | ICD-10-CM

## 2013-10-01 DIAGNOSIS — R0989 Other specified symptoms and signs involving the circulatory and respiratory systems: Secondary | ICD-10-CM

## 2013-10-01 NOTE — Patient Instructions (Addendum)
Your physician has requested that you have an echocardiogram. Echocardiography is a painless test that uses sound waves to create images of your heart. It provides your doctor with information about the size and shape of your heart and how well your heart's chambers and valves are working. This procedure takes approximately one hour. There are no restrictions for this procedure.  Your physician has requested that you have en exercise stress myoview. For further information please visit [email or web address]. Please follow instruction sheet, as given.  Your physician wants you to follow-up AT NEXT AVAILABLE OFFICE VISIT DR HARDING  DISCUSS RESULTS.  You will receive a reminder letter in the mail two months in advance. If you don't receive a letter, please call our office to schedule the follow-up appointment.

## 2013-10-04 ENCOUNTER — Encounter: Payer: Self-pay | Admitting: Cardiology

## 2013-10-04 DIAGNOSIS — R0609 Other forms of dyspnea: Secondary | ICD-10-CM

## 2013-10-04 DIAGNOSIS — R079 Chest pain, unspecified: Secondary | ICD-10-CM | POA: Insufficient documentation

## 2013-10-04 DIAGNOSIS — E785 Hyperlipidemia, unspecified: Secondary | ICD-10-CM | POA: Insufficient documentation

## 2013-10-04 DIAGNOSIS — I10 Essential (primary) hypertension: Secondary | ICD-10-CM | POA: Insufficient documentation

## 2013-10-04 DIAGNOSIS — E7800 Pure hypercholesterolemia, unspecified: Secondary | ICD-10-CM | POA: Insufficient documentation

## 2013-10-04 HISTORY — DX: Chest pain, unspecified: R07.9

## 2013-10-04 HISTORY — DX: Other forms of dyspnea: R06.09

## 2013-10-04 NOTE — Assessment & Plan Note (Signed)
This can be evaluated with a stress test, but also I will check a 2-D echocardiogram to assess for any sense of diastolic dysfunction based on his long-standing hypertension to suggest hypertensive heart disease.

## 2013-10-04 NOTE — Assessment & Plan Note (Signed)
The symptoms that he had the tooth immersion gets down somewhat atypical, however he does have cardiac risk factors of age, male, hypertension, dyslipidemia. That in conjunction with his decreased exercise tolerance of late warrants evaluation. Plan: Exercise Myoview/Cardiolite Nuclear Stress Test

## 2013-10-04 NOTE — Assessment & Plan Note (Signed)
He is not currently on a statin. With his hypertension and age, his goal HDL should be above 40 and LDL less than 130.  He is trying aggressive lifestyle modification. We can recheck labs in about 3 months to see what it looked like. We will be showing signs of improvement, however if the stress test to suggest any evidence of CAD, would probably consider starting statin.

## 2013-10-04 NOTE — Progress Notes (Signed)
PATIENTDetric Merritt MRN: [id number] DOB: 19-Jul-1950 PCP: Kandice Hams, MD  Clinic Note: Chief Complaint  Patient presents with  . New Patient    refer by Dr Delfina Redwood for chest discomfort and pain, pt c/o of tiredness and no sex drive frequent bathroom visit at night.   HPI: Kevin Merritt is a 63 y.o. male with a PMH below who presents today for cardiology consultation referred by [name].  He has a history of hypertension and hyperlipidemia. There is no family history of coronary disease. He was recently seen in the emergency room for an episode of chest pressure was found to have significant hypertension. His EKG was relatively unremarkable, and he ruled out for MI. When his discharge he was quite stable.  Interval History: Since his discharge he has made significant lifestyle changes but had reduced his stress level at work. He has also made dietary changes and is trying to exercise. He is not had any recurrence of the chest tightness or shortness of breath.  When he presented to the Urgent Care, he was sent to the emergency room. He was initially going to the Urgent Care for her concerns about elevated blood pressures, but then he started noting some discomfort in his chest. It was not really associated with dyspnea or diaphoresis. It did not radiate, staying n the left general chest/mid axillary region. He was not at all associated with any deep inspiration or coughing.  It was not exertional. He was treated with labetalol with blood pressure improving. His lisinopril dose was increased to 20 mg - which didn't make much sense since he was ordered on an ARB -- he is not currently taking ACE inhibitor and ARB together.  He does note having some exertional shortness of breath that has been bothering him over the last several months. He has been treating it to feeling deconditioned /out of shape, but now that he has had the episode of discomfort his concern.  Currently, he denies chest  tightness/pressure with rest or exertion. He denies any palpitations, rapid or irregular heartbeat, lightheadedness or dizziness, syncope/near syncope, TIAs/amaurosis fugax. No melena hematochezia or hematuria. No claudication.  Past Medical History  Diagnosis Date  . Hypertension   . High cholesterol     Prior Cardiac Evaluation and Past Surgical History: Past Surgical History  Procedure Laterality Date  . Thyroidectomy      Age 38  . Tonsillectomy      Age 89  . Hernia repair Right     Age 38  . Colonoscopy w/ polypectomy  2012    Allergies  Allergen Reactions  . Shellfish Allergy Nausea And Vomiting    Current Outpatient Prescriptions  Medication Sig Dispense Refill  . amLODipine (NORVASC) 10 MG tablet Take 10 mg by mouth daily.      Marland Kitchen aspirin EC 81 MG tablet Take 81 mg by mouth daily.      . Multiple Vitamin (MULTIVITAMIN WITH MINERALS) TABS tablet Take 1 tablet by mouth daily.      . valsartan-hydrochlorothiazide (DIOVAN-HCT) 320-25 MG per tablet Take 1 tablet by mouth daily.       No current facility-administered medications for this visit.    History   Social History Narrative   He is remarried, currently living with his second wife he has total of 6 children; 3 live with them. (5 sons and one daughter) One grandchild. 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 He never smoked and  does not drink alcohol.   He is a native of [place].    family history includes Diabetes (age of onset: 67) in his sister; Heart failure in his mother; Hypertension in his mother and sister.  ROS: A comprehensive Review of Systems - Negative except feeling tired all the time, and lack of sex drive. He also notes frequent episodes of nocturia. he also complains of intermittent knee pain worse in the right. Being followed by orthopedics.  PHYSICAL EXAM BP 116/60  Pulse 74  Ht 6\' 5"  (1.956 m)  Wt 250 lb (113.399 kg)  BMI 29.64 kg/m2 General  appearance: alert, cooperative, appears stated age, no distress and Healthy-appearing, well-nourished and well-groomed. Answers questions appropriately. HEENT: [place]/AT, EOMI, MMM, anicteric sclera Neck: no adenopathy, no carotid bruit, no JVD, supple, symmetrical, trachea midline and thyroid not enlarged, symmetric, no tenderness/mass/nodules Lungs: clear to auscultation bilaterally, normal percussion bilaterally and Nonlabored, good air movement Heart: regular rate and rhythm, S1, S2 normal, no murmur, click, rub or gallop and normal apical impulse Abdomen: soft, non-tender; bowel sounds normal; no masses,  no organomegaly Extremities: extremities normal, atraumatic, no cyanosis or edema Pulses: 2+ and symmetric Neurologic: Alert and oriented X 3, normal strength and tone. Normal symmetric reflexes. Normal coordination and gait; CN II-XII grossly intact   Adult ECG Report  Rate: 74 ;  Rhythm: normal sinus rhythm with borderline first degree AV block (PR interval 204), mild nonspecific ST and T wave changes; otherwise normal axis, durations; voltage borderline for LVH.  Recent Labs: Labs from the hospital reviewed. Relatively normal  From PCP: Total cholesterol 2:15, LDL 154, HDL 37 -- not at goal  ASSESSMENT / PLAN: To the healthy-appearing gentleman with hypertension hyperlipidemia and being evaluated for an episode of chest discomfort with accelerated hypertension.  Based on his risk factors there is a least moderate risk for coronary disease. Therefore warrants additional evaluation. He also has significant hypertension which could lead to hypertensive heart disease.  Chest pain with moderate risk for cardiac etiology The symptoms that he had the tooth immersion gets down somewhat atypical, however he does have cardiac risk factors of age, male, hypertension, dyslipidemia. That in conjunction with his decreased exercise tolerance of late warrants evaluation. Plan: Exercise Myoview/Cardiolite  Nuclear Stress Test  DOE (dyspnea on exertion) This can be evaluated with a stress test, but also I will check a 2-D echocardiogram to assess for any sense of diastolic dysfunction based on his long-standing hypertension to suggest hypertensive heart disease.  HTN (hypertension) Relatively well-controlled today, he clearly has labile pressures. For now continue current regimen.  High cholesterol He is not currently on a statin. With his hypertension and age, his goal HDL should be above 40 and LDL less than 130.  He is trying aggressive lifestyle modification. We can recheck labs in about 3 months to see what it looked like. We will be showing signs of improvement, however if the stress test to suggest any evidence of CAD, would probably consider starting statin.   Orders Placed This Encounter  Procedures  . Myocardial Perfusion Imaging    Standing Status: Future     Number of Occurrences:      Standing Expiration Date: 10/01/2014    Order Specific Question:  Where should this test be performed    Answer:  [facility]    Order Specific Question:  Type of stress    Answer:  Exercise    Order Specific Question:  Patient weight in lbs  Answer:  250  . EKG 12-Lead  . 2D Echocardiogram without contrast    alSO HTN    Standing Status: Future     Number of Occurrences:      Standing Expiration Date: 10/01/2014    Order Specific Question:  Type of Echo    Answer:  Complete    Order Specific Question:  Where should this test be performed    Answer:  [facility]    Order Specific Question:  Reason for exam-Echo    Answer:  Dyspnea  786.09    Order Specific Question:  Reason for exam-Echo    Answer:  Chest Pain  786.50    Order Specific Question:  Reason for exam-Echo    Answer:  Other - See Comments Section   No orders of the defined types were placed in this encounter.    Followup: To 3 weeks following Cardiolite and Echo    Leonie Man, M.D.,  M.S. Interventional Cardiologist   Pager # [phone] 10/04/2013

## 2013-10-04 NOTE — Assessment & Plan Note (Signed)
Relatively well-controlled today, he clearly has labile pressures. For now continue current regimen.

## 2013-10-12 ENCOUNTER — Telehealth (HOSPITAL_COMMUNITY): Payer: Self-pay

## 2013-10-13 ENCOUNTER — Telehealth (HOSPITAL_COMMUNITY): Payer: Self-pay

## 2013-10-15 ENCOUNTER — Ambulatory Visit (HOSPITAL_COMMUNITY)
Admission: RE | Admit: 2013-10-15 | Discharge: 2013-10-15 | Disposition: A | Discharge status: Home / Self Care | Payer: Commercial Managed Care - PPO | Source: Ambulatory Visit | Attending: Internal Medicine | Admitting: Internal Medicine

## 2013-10-15 ENCOUNTER — Inpatient Hospital Stay (HOSPITAL_COMMUNITY): Admission: RE | Admit: 2013-10-15 | Payer: Commercial Managed Care - PPO | Source: Ambulatory Visit

## 2013-10-15 DIAGNOSIS — I10 Essential (primary) hypertension: Secondary | ICD-10-CM

## 2013-10-15 DIAGNOSIS — R42 Dizziness and giddiness: Secondary | ICD-10-CM | POA: Insufficient documentation

## 2013-10-15 DIAGNOSIS — R0609 Other forms of dyspnea: Secondary | ICD-10-CM

## 2013-10-15 DIAGNOSIS — R079 Chest pain, unspecified: Secondary | ICD-10-CM

## 2013-10-15 DIAGNOSIS — R0602 Shortness of breath: Secondary | ICD-10-CM | POA: Insufficient documentation

## 2013-10-15 DIAGNOSIS — R0989 Other specified symptoms and signs involving the circulatory and respiratory systems: Secondary | ICD-10-CM

## 2013-10-15 HISTORY — PX: NM MYOVIEW LTD: HXRAD82

## 2013-10-15 MED ORDER — TECHNETIUM TC 99M SESTAMIBI GENERIC - CARDIOLITE
30.0000 | Freq: Once | INTRAVENOUS | Status: AC | PRN
  Administered 2013-10-15: 30 via INTRAVENOUS

## 2013-10-15 MED ORDER — TECHNETIUM TC 99M SESTAMIBI GENERIC - CARDIOLITE
10.0000 | Freq: Once | INTRAVENOUS | Status: AC | PRN
  Administered 2013-10-15: 10 via INTRAVENOUS

## 2013-10-15 NOTE — Procedures (Addendum)
[facility] [address] [place] [address] [phone]  Cardiology Nuclear Med Study  Kevin Merritt is a 63 y.o. male     MRN : [id number]     DOB: 10/03/1950  Procedure Date: 10/15/2013  Nuclear Med Background Indication for Stress Test:  Evaluation for Ischemia and [facility] History:  No prior cardiac or respiratory history reported; No prior NUC MPI for comparison. Cardiac Risk Factors: Hypertension, Lipids and Overweight  Symptoms:  Chest Pain, Dizziness, DOE, Fatigue, Light-Headedness and SOB   Nuclear Pre-Procedure Caffeine/Decaff Intake:  7:00pm NPO After: 5:00am   IV Site: R Hand  IV 0.9% NS with Angio Cath:  22g  Chest Size (in):  48"  IV Started by: Rolene Course, RN  Height: 6\' 5"  (1.956 m)  Cup Size: n/a  BMI:  Body mass index is 29.64 kg/(m^2). Weight:  250 lb (113.399 kg)   Tech Comments:  n/a    Nuclear Med Study 1 or 2 day study: 1 day  Stress Test Type:  Stress  Order Authorizing Provider:  Glenetta Hew, MD   Resting Radionuclide: Technetium 42m Sestamibi  Resting Radionuclide Dose: 10.4 mCi   Stress Radionuclide:  Technetium 40m Sestamibi  Stress Radionuclide Dose: 30.4 mCi           Stress Protocol Rest HR: 73 Stress HR: 155  Rest BP: 141/95 Stress BP: 218/95  Exercise Time (min): 7 METS: 8.5   Predicted Max HR: 155 bpm % Max HR: 100 bpm Rate Pressure Product: 33790  Dose of Adenosine (mg):  n/a Dose of Lexiscan: n/a mg  Dose of Atropine (mg): n/a Dose of Dobutamine: n/a mcg/kg/min (at max HR)  Stress Test Technologist: Leane Para, CCT Nuclear Technologist: Otho Perl, CNMT   Rest Procedure:  Myocardial perfusion imaging was performed at rest 45 minutes following the intravenous administration of Technetium 42m Sestamibi. Stress Procedure:  The patient performed treadmill exercise using a Bruce  Protocol for 7 minutes. The patient stopped due to marked SOB and ST-T  changes and denied any chest pain.  There were significant ST-T wave changes.  Technetium 41m Sestamibi was injected IV at peak exercise and myocardial perfusion imaging was performed after a brief delay.  Transient Ischemic Dilatation (Normal <1.22):  1.10 QGS EDV:  119 ml QGS ESV:  51 ml LV Ejection Fraction: 57%       Rest ECG: NSR with RV conduction delay and PAC, PVC with RBBB pattern  Stress ECG: Asymptomatic with 2 mm STdepression inferiorly with PVC's and rapid resolution in recovery  QPS Raw Data Images:  Normal; no motion artifact; normal heart/lung ratio. Stress Images:  Normal homogeneous uptake in all areas of the myocardium. Rest Images:  Normal homogeneous uptake in all areas of the myocardium. Subtraction (SDS):  Normal  Impression Exercise Capacity:  Good exercise capacity. BP Response:  Hypertensive blood pressure response. Clinical Symptoms:  No significant symptoms noted. ECG Impression:  Asymptomatic with 2 mm STdepression inferiorly suggestive of ischemia with PVC's but with rapid resolution in recovery Comparison with Prior Nuclear Study:  No study for comparison  Overall Impression:  Low risk stress nuclear study demonstrating asymptomatic ST depression with normal scintigraphic images and a hypertensive response to exercise.  LV Wall Motion:  NL LV Function, EF 57%; NL Wall Motion   Troy Sine, MD  10/15/2013 12:28 PM

## 2013-10-15 NOTE — Progress Notes (Signed)
Quick Note:  Stress Test looked good!! No sign of significant Heart Artery Disease. Pump function is normal.  Good news!!. Blood pressure did go up quite a bit with exercise -- tells [place] that we have more room to work with BP control.  Leonie Man, MD  ______

## 2013-10-16 ENCOUNTER — Telehealth: Payer: Self-pay | Admitting: *Deleted

## 2013-10-16 NOTE — Telephone Encounter (Signed)
Message copied by Raiford Simmonds on Fri Oct 16, 2013 10:14 AM ------      Message from: Leonie Man      Created: Thu Oct 15, 2013  6:58 PM       Stress Test looked good!! No sign of significant Heart Artery Disease.  Pump function is normal.            Good news!!.      Blood pressure did go up quite a bit with exercise -- tells [place] that we have more room to work with BP control.            Leonie Man, MD       ------

## 2013-10-16 NOTE — Telephone Encounter (Signed)
Patient return call. Spoke to patient. Result given . Verbalized understanding

## 2013-10-16 NOTE — Telephone Encounter (Signed)
Left message to call back  

## 2013-10-20 ENCOUNTER — Telehealth (HOSPITAL_COMMUNITY): Payer: Self-pay | Admitting: *Deleted

## 2013-10-28 ENCOUNTER — Ambulatory Visit (HOSPITAL_COMMUNITY)
Admission: RE | Admit: 2013-10-28 | Discharge: 2013-10-28 | Disposition: A | Discharge status: Home / Self Care | Payer: Commercial Managed Care - PPO | Source: Ambulatory Visit | Attending: Cardiology | Admitting: Cardiology

## 2013-10-28 DIAGNOSIS — R0989 Other specified symptoms and signs involving the circulatory and respiratory systems: Secondary | ICD-10-CM | POA: Insufficient documentation

## 2013-10-28 DIAGNOSIS — R079 Chest pain, unspecified: Secondary | ICD-10-CM

## 2013-10-28 DIAGNOSIS — R0609 Other forms of dyspnea: Secondary | ICD-10-CM | POA: Insufficient documentation

## 2013-10-28 DIAGNOSIS — I517 Cardiomegaly: Secondary | ICD-10-CM

## 2013-10-28 DIAGNOSIS — I10 Essential (primary) hypertension: Secondary | ICD-10-CM

## 2013-10-28 HISTORY — PX: TRANSTHORACIC ECHOCARDIOGRAM: SHX275

## 2013-10-28 NOTE — Progress Notes (Signed)
2D Echocardiogram Complete.  10/28/2013   Bethany McMahill, RDCS  

## 2013-10-28 NOTE — Progress Notes (Signed)
Quick Note:  Echo results: Good news: Essentially normal echocardiogram and normal pump function and normal valve function. No signs to suggest heart attack.. EF: 65-70%.  Moderate LVH - means the heart is a bit thickened b/c of high BP. This makes it a bit stiff with poor relaxation -- can lead to exertional SOB. No regional wall motion abnormalities  Jordell Outten W, MD  ______

## 2013-11-09 ENCOUNTER — Ambulatory Visit: Payer: Commercial Managed Care - PPO | Admitting: Cardiology

## 2013-11-18 ENCOUNTER — Ambulatory Visit (INDEPENDENT_AMBULATORY_CARE_PROVIDER_SITE_OTHER): Payer: Commercial Managed Care - PPO | Admitting: Cardiology

## 2013-11-18 VITALS — BP 155/87 | HR 77 | Ht 77.0 in | Wt 247.6 lb

## 2013-11-18 DIAGNOSIS — E7800 Pure hypercholesterolemia, unspecified: Secondary | ICD-10-CM

## 2013-11-18 DIAGNOSIS — R079 Chest pain, unspecified: Secondary | ICD-10-CM

## 2013-11-18 DIAGNOSIS — I10 Essential (primary) hypertension: Secondary | ICD-10-CM

## 2013-11-18 NOTE — Patient Instructions (Signed)
Your physician recommends that you schedule a follow-up appointment in: As Needed    

## 2013-11-19 NOTE — Telephone Encounter (Signed)
Encounter complete. 

## 2013-11-22 ENCOUNTER — Encounter: Payer: Self-pay | Admitting: Cardiology

## 2013-11-22 NOTE — Assessment & Plan Note (Addendum)
Non-ischemic Myoview with no chest discomfort @ 8.5 METs -- most likley NOT Cardiac in nature.  He could potentially have some mild microvascular ischemia from diastolic dysfunction, but unlikely to have macrovascular disease.  I continued to encourage his lifestyle modifications with diet & exercise.  With weight loss, he should hopefully regain his energy.

## 2013-11-22 NOTE — Progress Notes (Signed)
PATIENTChriston Merritt MRN: [id number] DOB: 31-Aug-1950 PCP: Kevin Hams, Kevin Merritt  Clinic Note: Chief Complaint  Patient presents with  . Follow-up    follow-up stress test and echo   HPI: Kevin Merritt is a 63 y.o. male with a PMH below who presents today for followup of initial consultation following a emergency room visit for evaluation of chest pressure cardiology consultation;  referred by [name].  He has a history of hypertension and hyperlipidemia. There is no family history of coronary disease. Given his CRFs - Myoview & Echo ordered.  Results below.  Interval History: Since his last visit, he has continued working on significant lifestyle changes & has reduced his stress level at work with dietary changes and is trying to exercise. He is not had any recurrence of the chest tightness or shortness of breath.   He does note having some exertional shortness of breath that has been bothering him over the last several months, but also admits to being deconditioned /out of shape.   Currently, he denies chest tightness/pressure with rest or exertion. He denies any palpitations, rapid or irregular heartbeat, lightheadedness or dizziness, syncope/near syncope, TIAs/amaurosis fugax. No melena hematochezia or hematuria. No claudication.  Past Medical History  Diagnosis Date  . Hypertension   . High cholesterol     Prior Cardiac Evaluation and Past Surgical History: Past Surgical History  Procedure Laterality Date  . Thyroidectomy      Age 61  . Tonsillectomy      Age 26  . Hernia repair Right     Age 32  . Colonoscopy w/ polypectomy  2012  . Nm myoview ltd  10/15/2013    7 min, 8.5 METs, 57%, ASx ST depressions - No ischemia / infarction.  . Transthoracic echocardiogram  10/28/2013    Nl LV Size & function; EF 65-70%, mod Conc LVH with Gd 2 DD    Allergies  Allergen Reactions  . Shellfish Allergy Nausea And Vomiting    Current Outpatient Prescriptions  Medication Sig  Dispense Refill  . amLODipine (NORVASC) 10 MG tablet Take 10 mg by mouth daily.      Marland Kitchen aspirin EC 81 MG tablet Take 81 mg by mouth daily.      Marland Kitchen omega-3 fish oil (MAXEPA) 1000 MG CAPS capsule Take 2 capsules by mouth 2 (two) times daily.      . valsartan-hydrochlorothiazide (DIOVAN-HCT) 320-25 MG per tablet Take 1 tablet by mouth daily.       No current facility-administered medications for this visit.    History   Social History Narrative   He is remarried, currently living with his second wife he has total of 6 children; 3 live with them. (5 sons and one daughter) One grandchild. 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 He never smoked and does not drink alcohol.   He is a native of [place].    family history includes Diabetes (age of onset: 79) in his sister; Heart failure in his mother; Hypertension in his mother and sister.  ROS: A comprehensive Review of Systems - Negative except that he still feels tired all the time, and lack of sex drive. He also notes frequent episodes of nocturia. he also complains of intermittent knee pain worse in the right. Being followed by orthopedics.  PHYSICAL EXAM BP 155/87  Pulse 77  Ht 6\' 5"  (1.956 m)  Wt 247 lb 9.6 oz (112.311 kg)  BMI 29.36 kg/m2 General appearance:  alert, cooperative, appears stated age, no distress and Healthy-appearing, well-nourished and well-groomed. Answers questions appropriately. 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  ECG changes on Myoview are most likely related to LVH.    Chest pain with low risk for cardiac etiology Non-ischemic Myoview with no chest discomfort @ 8.5 METs -- most likley NOT Cardiac in nature.  He could potentially have some mild microvascular ischemia from diastolic dysfunction, but unlikely to have macrovascular disease.  I continued to encourage his lifestyle modifications with diet & exercise.  With weight loss, he should hopefully regain his energy.  Essential hypertension A bit elevated today - has not taken meds yet.  Is on max doses of his current medications - with LVH would need a bit more aggressive care.  Next option would be Beta Blockers - Coreg or Bystolic.  High cholesterol ON statin  - monitored by PCP.  Will start Omega 3FA.    Lifestyle modification.  -- Defer to PCP   No orders of the defined types were placed in this encounter.   Meds ordered this encounter  Medications  . omega-3 fish oil (MAXEPA) 1000 MG CAPS capsule    Sig: Take 2 capsules by mouth 2 (two) times daily.    Followup:PRN with PCP   Kevin Merritt, M.D., M.S. Interventional Cardiologist   Pager # [phone] 11/22/2013

## 2013-11-22 NOTE — Assessment & Plan Note (Signed)
A bit elevated today - has not taken meds yet.  Is on max doses of his current medications - with LVH would need a bit more aggressive care.  Next option would be Beta Blockers - Coreg or Bystolic.

## 2013-11-22 NOTE — Assessment & Plan Note (Addendum)
ON statin  - monitored by PCP.  Will start Omega 3FA.    Lifestyle modification.  -- Defer to PCP

## 2015-02-01 ENCOUNTER — Other Ambulatory Visit: Payer: Self-pay | Admitting: Internal Medicine

## 2015-02-01 ENCOUNTER — Ambulatory Visit
Admission: RE | Admit: 2015-02-01 | Discharge: 2015-02-01 | Disposition: A | Discharge status: Home / Self Care | Payer: Commercial Managed Care - PPO | Source: Ambulatory Visit | Attending: Internal Medicine | Admitting: Internal Medicine

## 2015-02-01 DIAGNOSIS — M25551 Pain in right hip: Secondary | ICD-10-CM

## 2015-02-01 DIAGNOSIS — M545 Low back pain: Secondary | ICD-10-CM

## 2017-09-25 DIAGNOSIS — M545 Low back pain: Secondary | ICD-10-CM | POA: Diagnosis not present

## 2017-09-25 DIAGNOSIS — M25559 Pain in unspecified hip: Secondary | ICD-10-CM | POA: Diagnosis not present

## 2017-12-06 DIAGNOSIS — E291 Testicular hypofunction: Secondary | ICD-10-CM | POA: Diagnosis not present

## 2017-12-06 DIAGNOSIS — Z1389 Encounter for screening for other disorder: Secondary | ICD-10-CM | POA: Diagnosis not present

## 2017-12-06 DIAGNOSIS — E7800 Pure hypercholesterolemia, unspecified: Secondary | ICD-10-CM | POA: Diagnosis not present

## 2017-12-06 DIAGNOSIS — N182 Chronic kidney disease, stage 2 (mild): Secondary | ICD-10-CM | POA: Diagnosis not present

## 2017-12-06 DIAGNOSIS — Z23 Encounter for immunization: Secondary | ICD-10-CM | POA: Diagnosis not present

## 2017-12-06 DIAGNOSIS — Z0000 Encounter for general adult medical examination without abnormal findings: Secondary | ICD-10-CM | POA: Diagnosis not present

## 2017-12-06 DIAGNOSIS — Z125 Encounter for screening for malignant neoplasm of prostate: Secondary | ICD-10-CM | POA: Diagnosis not present

## 2017-12-06 DIAGNOSIS — I10 Essential (primary) hypertension: Secondary | ICD-10-CM | POA: Diagnosis not present

## 2017-12-06 DIAGNOSIS — M545 Low back pain: Secondary | ICD-10-CM | POA: Diagnosis not present

## 2018-01-03 DIAGNOSIS — M6530 Trigger finger, unspecified finger: Secondary | ICD-10-CM | POA: Diagnosis not present

## 2018-01-03 DIAGNOSIS — I10 Essential (primary) hypertension: Secondary | ICD-10-CM | POA: Diagnosis not present

## 2018-02-05 DIAGNOSIS — M65342 Trigger finger, left ring finger: Secondary | ICD-10-CM | POA: Diagnosis not present

## 2018-10-10 DIAGNOSIS — R1030 Lower abdominal pain, unspecified: Secondary | ICD-10-CM | POA: Diagnosis not present

## 2018-10-10 DIAGNOSIS — M545 Low back pain: Secondary | ICD-10-CM | POA: Diagnosis not present

## 2019-01-13 DIAGNOSIS — E291 Testicular hypofunction: Secondary | ICD-10-CM | POA: Diagnosis not present

## 2019-01-13 DIAGNOSIS — Z0000 Encounter for general adult medical examination without abnormal findings: Secondary | ICD-10-CM | POA: Diagnosis not present

## 2019-01-13 DIAGNOSIS — I10 Essential (primary) hypertension: Secondary | ICD-10-CM | POA: Diagnosis not present

## 2019-01-13 DIAGNOSIS — Z1389 Encounter for screening for other disorder: Secondary | ICD-10-CM | POA: Diagnosis not present

## 2019-01-13 DIAGNOSIS — Z125 Encounter for screening for malignant neoplasm of prostate: Secondary | ICD-10-CM | POA: Diagnosis not present

## 2019-01-13 DIAGNOSIS — E7800 Pure hypercholesterolemia, unspecified: Secondary | ICD-10-CM | POA: Diagnosis not present

## 2019-01-13 DIAGNOSIS — N182 Chronic kidney disease, stage 2 (mild): Secondary | ICD-10-CM | POA: Diagnosis not present

## 2019-01-13 DIAGNOSIS — Z23 Encounter for immunization: Secondary | ICD-10-CM | POA: Diagnosis not present

## 2019-01-30 DIAGNOSIS — R972 Elevated prostate specific antigen [PSA]: Secondary | ICD-10-CM | POA: Diagnosis not present

## 2019-01-30 DIAGNOSIS — R351 Nocturia: Secondary | ICD-10-CM | POA: Diagnosis not present

## 2019-01-30 DIAGNOSIS — R350 Frequency of micturition: Secondary | ICD-10-CM | POA: Diagnosis not present

## 2019-01-30 DIAGNOSIS — N5201 Erectile dysfunction due to arterial insufficiency: Secondary | ICD-10-CM | POA: Diagnosis not present

## 2019-01-30 DIAGNOSIS — N401 Enlarged prostate with lower urinary tract symptoms: Secondary | ICD-10-CM | POA: Diagnosis not present

## 2019-02-20 DIAGNOSIS — R972 Elevated prostate specific antigen [PSA]: Secondary | ICD-10-CM | POA: Diagnosis not present

## 2019-02-20 DIAGNOSIS — C61 Malignant neoplasm of prostate: Secondary | ICD-10-CM | POA: Diagnosis not present

## 2019-02-26 DIAGNOSIS — Z0100 Encounter for examination of eyes and vision without abnormal findings: Secondary | ICD-10-CM | POA: Diagnosis not present

## 2019-03-04 DIAGNOSIS — R351 Nocturia: Secondary | ICD-10-CM | POA: Diagnosis not present

## 2019-03-04 DIAGNOSIS — C61 Malignant neoplasm of prostate: Secondary | ICD-10-CM | POA: Diagnosis not present

## 2019-03-04 DIAGNOSIS — R3912 Poor urinary stream: Secondary | ICD-10-CM | POA: Diagnosis not present

## 2019-03-04 DIAGNOSIS — N401 Enlarged prostate with lower urinary tract symptoms: Secondary | ICD-10-CM | POA: Diagnosis not present

## 2019-03-10 ENCOUNTER — Encounter: Payer: Self-pay | Admitting: Radiation Oncology

## 2019-03-10 ENCOUNTER — Ambulatory Visit
Admission: RE | Admit: 2019-03-10 | Discharge: 2019-03-10 | Disposition: A | Discharge status: Home / Self Care | Payer: Medicare HMO | Source: Ambulatory Visit | Attending: Radiation Oncology | Admitting: Radiation Oncology

## 2019-03-10 ENCOUNTER — Other Ambulatory Visit: Payer: Self-pay

## 2019-03-10 DIAGNOSIS — C61 Malignant neoplasm of prostate: Secondary | ICD-10-CM | POA: Insufficient documentation

## 2019-03-10 DIAGNOSIS — R972 Elevated prostate specific antigen [PSA]: Secondary | ICD-10-CM | POA: Diagnosis not present

## 2019-03-10 HISTORY — DX: Malignant neoplasm of prostate: C61

## 2019-03-10 NOTE — Patient Instructions (Signed)
Coronavirus (COVID-19) Are you at risk?  Are you at risk for the Coronavirus (COVID-19)?  To be considered HIGH RISK for Coronavirus (COVID-19), you have to meet the following criteria:  . Traveled to [place], [place], [place], [place] or [place]; or in the [place] to [place], [place], [place], or [place]; and have fever, cough, and shortness of breath within the last 2 weeks of travel OR . Been in close contact with a person diagnosed with COVID-19 within the last 2 weeks and have fever, cough, and shortness of breath . IF YOU DO NOT MEET THESE CRITERIA, YOU ARE CONSIDERED LOW RISK FOR COVID-19.  What to do if you are HIGH RISK for COVID-19?  . If you are having a medical emergency, call 911. . Seek medical care right away. Before you go to a doctor's office, urgent care or emergency department, call ahead and tell them about your recent travel, contact with someone diagnosed with COVID-19, and your symptoms. You should receive instructions from your physician's office regarding next steps of care.  . When you arrive at healthcare provider, tell the healthcare staff immediately you have returned from visiting [place], [place], [place], [place] or [place]; or traveled in the [place] to [place], [place], [place], or [place]; in the last two weeks or you have been in close contact with a person diagnosed with COVID-19 in the last 2 weeks.   . Tell the health care staff about your symptoms: fever, cough and shortness of breath. . After you have been seen by a medical provider, you will be either: o Tested for (COVID-19) and discharged home on quarantine except to seek medical care if symptoms worsen, and asked to  - Stay home and avoid contact with others until you get your results (4-5 days)  - Avoid travel on public transportation if possible (such as bus, train, or airplane) or o Sent to the Emergency Department by EMS for evaluation, COVID-19 testing, and possible  admission depending on your condition and test results.  What to do if you are LOW RISK for COVID-19?  Reduce your risk of any infection by using the same precautions used for avoiding the common cold or flu:  . Wash your hands often with soap and warm water for at least 20 seconds.  If soap and water are not readily available, use an alcohol-based hand sanitizer with at least 60% alcohol.  . If coughing or sneezing, cover your mouth and nose by coughing or sneezing into the elbow areas of your shirt or coat, into a tissue or into your sleeve (not your hands). . Avoid shaking hands with others and consider head nods or verbal greetings only. . Avoid touching your eyes, nose, or mouth with unwashed hands.  . Avoid close contact with people who are sick. . Avoid places or events with large numbers of people in one location, like concerts or sporting events. . Carefully consider travel plans you have or are making. . If you are planning any travel outside or inside the [place], visit the CDC's Travelers' Health webpage for the latest health notices. . If you have some symptoms but not all symptoms, continue to monitor at home and seek medical attention if your symptoms worsen. . If you are having a medical emergency, call 911.   ADDITIONAL HEALTHCARE OPTIONS FOR PATIENTS  [facility] / e-Visit: [email or web address]         [facility]: [phone]  [facility]: [phone]                   [facility]: [phone]   

## 2019-03-10 NOTE — Progress Notes (Signed)
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 none  Pacemaker/ICD? none  Possible current pregnancy? male  Is the patient on methotrexate? no  Current Complaints / other details:  Denies any other issues or complaints of. Urologist told him he would not need any thing other than external beam radiation. IPSS 10

## 2019-03-10 NOTE — Progress Notes (Signed)
Radiation Oncology         [phone] ________________________________  Initial outpatient Consultation - Conducted via MyChart due to current COVID-19 concerns for limiting patient exposure  Name: [place] MRN: [id number]  Date: 03/10/2019  DOB: 1950/11/08  [id number], Jori Moll, MD  Lucas Mallow, MD   REFERRING PHYSICIAN: [place] Mallow, MD  DIAGNOSIS: 68 y.o. gentleman with Stage T1c adenocarcinoma of the prostate with Gleason score of 4+3, and PSA of 5.45.  No diagnosis found.  HISTORY OF PRESENT ILLNESS: [place] is a 68 y.o. male with a diagnosis of prostate cancer. He was noted to have an elevated PSA of 5.45 by his primary care physician, [name].  Accordingly, he was referred for evaluation in urology by Dr. Gloriann Loan on 01/30/2019,  digital rectal examination was performed at that time revealing no nodules.  The patient proceeded to transrectal ultrasound with 12 biopsies of the prostate on 02/20/2019.  The prostate volume measured 72.89 cc.  Out of 12 core biopsies, 8 were positive.  The maximum Gleason score was 4+3, and this was seen in left base lateral and left mid. Gleason 3+4 was seen in right mid lateral, left base, and left mid lateral. Gleason 3+3 was seen in right apex lateral (with perineural invasion), right base lateral, and right mid.  The patient reviewed the biopsy results with his urologist and he has kindly been referred today for discussion of potential radiation treatment options.    PREVIOUS RADIATION THERAPY: No  PAST MEDICAL HISTORY:  Past Medical History:  Diagnosis Date   High cholesterol    Hypertension       PAST SURGICAL HISTORY: Past Surgical History:  Procedure Laterality Date   COLONOSCOPY W/ POLYPECTOMY  2012   HERNIA REPAIR Right    Age 19   NM MYOVIEW LTD  10/15/2013   7 min, 8.5 METs, 57%, ASx ST depressions - No ischemia / infarction.   THYROIDECTOMY     Age 83   TONSILLECTOMY     Age 25   TRANSTHORACIC  ECHOCARDIOGRAM  10/28/2013   Nl LV Size & function; EF 65-70%, mod Conc LVH with Gd 2 DD    FAMILY HISTORY:  Family History  Problem Relation Age of Onset   Hypertension Mother    Diabetes Sister 54   Heart failure Mother    Hypertension Sister     SOCIAL HISTORY:  Social History   Socioeconomic History   Marital status: Married    Spouse name: Not on file   Number of children: Not on file   Years of education: Not on file   Highest education level: Not on file  Occupational History   Not on file  Social Needs   Financial resource strain: Not on file   Food insecurity    Worry: Not on file    Inability: Not on file   Transportation needs    Medical: Not on file    Non-medical: Not on file  Tobacco Use   Smoking status: Never Smoker   Smokeless tobacco: Never Used  Substance and Sexual Activity   Alcohol use: No   Drug use: No   Sexual activity: Not on file  Lifestyle   Physical activity    Days per week: Not on file    Minutes per session: Not on file   Stress: Not on file  Relationships   Social connections    Talks on phone: Not on file    Gets together: Not  on file    Attends religious service: Not on file    Active member of club or organization: Not on file    Attends meetings of clubs or organizations: Not on file    Relationship status: Not on file   Intimate partner violence    Fear of current or ex partner: Not on file    Emotionally abused: Not on file    Physically abused: Not on file    Forced sexual activity: Not on file  Other Topics Concern   Not on file  Social History Narrative   He is remarried, currently living with his second wife he has total of 6 children; 3 live with them. (5 sons and one daughter) One grandchild. 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 He never smoked and does not drink alcohol.   He is a native of [place].    ALLERGIES: Shellfish  allergy  MEDICATIONS:  Current Outpatient Medications  Medication Sig Dispense Refill   amLODipine (NORVASC) 10 MG tablet Take 10 mg by mouth daily.     aspirin EC 81 MG tablet Take 81 mg by mouth daily.     omega-3 fish oil (MAXEPA) 1000 MG CAPS capsule Take 2 capsules by mouth 2 (two) times daily.     valsartan-hydrochlorothiazide (DIOVAN-HCT) 320-25 MG per tablet Take 1 tablet by mouth daily.     No current facility-administered medications for this encounter.     REVIEW OF SYSTEMS:  On review of systems, the patient reports that he is doing well overall. He denies any chest pain, shortness of breath, cough, fevers, chills, night sweats, unintended weight changes. He denies any bowel disturbances, and denies abdominal pain, nausea or vomiting. He denies any new musculoskeletal or joint aches or pains. His IPSS was 10, indicating moderate urinary symptoms. His SHIM was recorded. A complete review of systems is obtained and is otherwise negative.    PHYSICAL EXAM:  Wt Readings from Last 3 Encounters:  11/18/13 247 lb 9.6 oz (112.3 kg)  10/15/13 250 lb (113.4 kg)  10/01/13 250 lb (113.4 kg)   Temp Readings from Last 3 Encounters:  09/16/13 98.5 F (36.9 C) (Oral)  09/16/13 98.2 F (36.8 C) (Oral)  08/31/11 97.6 F (36.4 C) (Oral)   BP Readings from Last 3 Encounters:  11/18/13 (!) 155/87  10/01/13 116/60  09/16/13 163/99   Pulse Readings from Last 3 Encounters:  11/18/13 77  10/01/13 74  09/16/13 68    /10  In general this is a well appearing man in no acute distress. He's alert and oriented x4 and appropriate throughout the examination. Cardiopulmonary assessment is negative for acute distress and he exhibits normal effort.    KPS = 100  100 - Normal; no complaints; no evidence of disease. 90   - Able to carry on normal activity; minor signs or symptoms of disease. 80   - Normal activity with effort; some signs or symptoms of disease. 79   - Cares for self;  unable to carry on normal activity or to do active work. 60   - Requires occasional assistance, but is able to care for most of his personal needs. 50   - Requires considerable assistance and frequent medical care. 41   - Disabled; requires special care and assistance. 48   - Severely disabled; hospital admission is indicated although death not imminent. 59   - Very sick; hospital admission necessary; active supportive treatment necessary. 10   -  Moribund; fatal processes progressing rapidly. 0     - Dead  Karnofsky DA, Abelmann [place], Craver LS and Burchenal [facility] [phone]) The use of the nitrogen mustards in the palliative treatment of carcinoma: with particular reference to bronchogenic carcinoma Cancer 1 634-56  LABORATORY DATA:  Lab Results  Component Value Date   WBC 5.7 09/16/2013   HGB 14.0 09/16/2013   HCT 41.0 09/16/2013   MCV 91.1 09/16/2013   PLT 174 09/16/2013   Lab Results  Component Value Date   NA 140 09/16/2013   K 4.8 09/16/2013   CL 100 09/16/2013   CO2 28 09/16/2013   No results found for: ALT, AST, GGT, ALKPHOS, BILITOT   RADIOGRAPHY: No results found.    IMPRESSION/PLAN: 1. 68 y.o. gentleman with Stage T1c adenocarcinoma of the prostate with Gleason Score of 4+3, and PSA of 5.45. This visit was conducted via MyChart to spare the patient unnecessary potential exposure in the healthcare setting during the current COVID-19 pandemic. We discussed the patient's workup and outlined the nature of prostate cancer in this setting. The patient's T stage, Gleason's score, and PSA put him into the intermediate risk group. Accordingly, he is eligible for a variety of potential treatment options including brachytherapy, 5.5-8 weeks of external radiation or 5 weeks of external radiation preceded by a brachytherapy boost. We discussed the available radiation techniques, and focused on the details and logistics and delivery. The patient may not be an ideal candidate for brachytherapy  boost with a prostate greater than 60 cc. We discussed that based on his prostate volume, he would require beginning treatment with a 5 alpha reductase inhibitor and ADT for at least 3 months to allow for downsizing of the prostate prior to initiating radiotherapy. We would need to repeat a TRUSP after 3 months of therapy to reassess the prostate volume at that time and confirm whether he is a candidate for radiotherapy.} We discussed and outlined the risks, benefits, short and long-term effects associated with radiotherapy and compared and contrasted these with prostatectomy. We discussed the role of SpaceOAR in reducing the rectal toxicity associated with radiotherapy.  At the end of the conversation the patient is interested in continuing to think about his options  Given current concerns for patient exposure during the COVID-19 pandemic, this encounter was conducted via video-enabled MyChart visit. The patient has given verbal consent for this type of encounter. The time spent during this encounter was 60 minutes. The attendants for this meeting include Tyler Pita MD, patient Jeyden Konopa and his wife During the encounter, Tyler Pita MD was located at [facility] Radiation Oncology Department.  Patient Quason Lauritsen his wife were located at home.   ------------------------------------------------   Tyler Pita, MD [place] Director and Director of Stereotactic Radiosurgery Direct Dial: [phone]   Fax: [phone] [email or web address]   Skype   [email or web address]   This document serves as a record of services personally performed by Tyler Pita, MD. It was created on his behalf by Wilburn Mylar, a trained medical scribe. The creation of this record is based on the scribe's personal observations and the provider's statements to them. This document has been checked and approved by the attending provider.

## 2019-03-13 DIAGNOSIS — I10 Essential (primary) hypertension: Secondary | ICD-10-CM | POA: Diagnosis not present

## 2019-03-13 DIAGNOSIS — E785 Hyperlipidemia, unspecified: Secondary | ICD-10-CM | POA: Diagnosis not present

## 2019-03-13 DIAGNOSIS — Z8042 Family history of malignant neoplasm of prostate: Secondary | ICD-10-CM | POA: Diagnosis not present

## 2019-03-13 DIAGNOSIS — C61 Malignant neoplasm of prostate: Secondary | ICD-10-CM | POA: Diagnosis not present

## 2019-03-19 DIAGNOSIS — R972 Elevated prostate specific antigen [PSA]: Secondary | ICD-10-CM | POA: Diagnosis not present

## 2019-03-19 DIAGNOSIS — Z79899 Other long term (current) drug therapy: Secondary | ICD-10-CM | POA: Diagnosis not present

## 2019-03-19 DIAGNOSIS — C61 Malignant neoplasm of prostate: Secondary | ICD-10-CM | POA: Diagnosis not present

## 2019-03-20 ENCOUNTER — Encounter: Payer: Self-pay | Admitting: Medical Oncology

## 2019-03-20 NOTE — Progress Notes (Signed)
Left a message to introduce myself as the prostate nurse navigator and discuss my role. I was unable to meet him when he consulted  with Dr. Tammi Klippel 10/27. I requested a return call so we can discuss his treatment decision.

## 2019-03-24 ENCOUNTER — Telehealth: Payer: Self-pay | Admitting: Medical Oncology

## 2019-03-24 NOTE — Telephone Encounter (Signed)
Patient called regarding ADT/radiaiton. I informed him that Ashlyn, PA would like for him to discuss this with Dr. Gloriann Loan. He has a follow up appointment 11/19 and will discuss. He voice understanding.

## 2019-03-26 ENCOUNTER — Telehealth: Payer: Self-pay | Admitting: Medical Oncology

## 2019-03-26 NOTE — Telephone Encounter (Signed)
Patient called asking if he should move forward with radiation while waiting to discuss ADT with Dr. Gloriann Loan. I discussed how the ADT works and if he gets it, will we need to wait about 8 weeks after there injection to start radiation. He voiced understanding. He has appointment 11/19 with Dr. Gloriann Loan.

## 2019-04-02 ENCOUNTER — Encounter: Payer: Self-pay | Admitting: Urology

## 2019-04-02 DIAGNOSIS — C61 Malignant neoplasm of prostate: Secondary | ICD-10-CM | POA: Diagnosis not present

## 2019-04-02 NOTE — Progress Notes (Signed)
Per communication from Dr. Gloriann Loan on 04/02/2019, this patient has elected to proceed with a 5-1/2-week course of prostate IMRT concurrent with ST-ADT.  I have sent an in-basket request to Romie Jumper to move forward with coordinating a follow up with [facility] to start ADT now, first available.  Will also need to coordinate for fiducial markers and SpaceOAR to be placed with Dr. Gloriann Loan, sometime in mid January, prior to CT SIM/treatment planning in anticipation of beginning his treatment in late January/early February (approximately 8 weeks after start of ADT).  Nicholos Johns, MMS, PA-C [place] at [place]: [phone]  Fax: [phone]

## 2019-04-07 ENCOUNTER — Encounter: Payer: Self-pay | Admitting: Medical Oncology

## 2019-04-07 DIAGNOSIS — Z5111 Encounter for antineoplastic chemotherapy: Secondary | ICD-10-CM | POA: Diagnosis not present

## 2019-04-07 DIAGNOSIS — C61 Malignant neoplasm of prostate: Secondary | ICD-10-CM | POA: Diagnosis not present

## 2019-05-10 ENCOUNTER — Other Ambulatory Visit: Payer: Self-pay | Admitting: Urology

## 2019-05-10 DIAGNOSIS — C61 Malignant neoplasm of prostate: Secondary | ICD-10-CM

## 2019-05-19 DIAGNOSIS — C61 Malignant neoplasm of prostate: Secondary | ICD-10-CM | POA: Diagnosis not present

## 2019-05-21 ENCOUNTER — Encounter: Payer: Self-pay | Admitting: *Deleted

## 2019-05-22 ENCOUNTER — Ambulatory Visit: Payer: Medicare HMO | Admitting: Radiation Oncology

## 2019-05-29 ENCOUNTER — Telehealth: Payer: Self-pay | Admitting: Radiation Oncology

## 2019-05-29 NOTE — Telephone Encounter (Signed)
Phoned patient to confirm his needs had been addressed. He confirms that [place] took care of him. He denies additional needs at this time.

## 2019-05-29 NOTE — Telephone Encounter (Signed)
Received voicemail message from patient requesting return call. Phoned patient back. No answer. Left message with my direct number for needs or concerns. Also, left direct number for scheduler, Romie Jumper, if the patient's question is related to scheduling. Awaiting return call.

## 2019-06-01 ENCOUNTER — Telehealth: Payer: Self-pay | Admitting: *Deleted

## 2019-06-01 NOTE — Telephone Encounter (Signed)
CALLED PATIENT TO REMIND OF SIM AND MRI APPT. FOR 06-02-19, LVM FOR A RETURN CALL

## 2019-06-02 ENCOUNTER — Encounter: Payer: Self-pay | Admitting: Urology

## 2019-06-02 ENCOUNTER — Ambulatory Visit
Admission: RE | Admit: 2019-06-02 | Discharge: 2019-06-02 | Disposition: A | Discharge status: Home / Self Care | Payer: Medicare HMO | Source: Ambulatory Visit | Attending: Radiation Oncology | Admitting: Radiation Oncology

## 2019-06-02 ENCOUNTER — Ambulatory Visit (HOSPITAL_COMMUNITY)
Admission: RE | Admit: 2019-06-02 | Discharge: 2019-06-02 | Disposition: A | Discharge status: Home / Self Care | Payer: Medicare HMO | Source: Ambulatory Visit | Attending: Urology | Admitting: Urology

## 2019-06-02 ENCOUNTER — Other Ambulatory Visit: Payer: Self-pay

## 2019-06-02 DIAGNOSIS — C61 Malignant neoplasm of prostate: Secondary | ICD-10-CM

## 2019-06-02 NOTE — Progress Notes (Signed)
Patient reports starting ADT in 03/2019 and had fiducial markers and SpaceOAR gel with Dr. Gloriann Loan 05/19/19.  Nicholos Johns, MMS, PA-C [place] at [place]: [phone]  Fax: [phone]

## 2019-06-02 NOTE — Progress Notes (Signed)
  Radiation Oncology         [phone] ________________________________  Name: [place] MRN: [id number]  Date: 06/02/2019  DOB: [birth year]  SIMULATION AND TREATMENT PLANNING NOTE    ICD-10-CM   1. Prostate cancer [facility])  C61     DIAGNOSIS:  69 y.o. gentleman with Stage T1c adenocarcinoma of the prostate with Gleason score of 4+3, and PSA of 5.45.  NARRATIVE:  The patient was brought to the [facility].  Identity was confirmed.  All relevant records and images related to the planned course of therapy were reviewed.  The patient freely provided informed written consent to proceed with treatment after reviewing the details related to the planned course of therapy. The consent form was witnessed and verified by the simulation staff.  Then, the patient was set-up in a stable reproducible supine position for radiation therapy.  A vacuum lock pillow device was custom fabricated to position his legs in a reproducible immobilized position.  Then, I performed a urethrogram under sterile conditions to identify the prostatic apex.  CT images were obtained.  Surface markings were placed.  The CT images were loaded into the planning software.  Then the prostate target and avoidance structures including the rectum, bladder, bowel and hips were contoured.  Treatment planning then occurred.  The radiation prescription was entered and confirmed.  A total of one complex treatment devices was fabricated. I have requested : Intensity Modulated Radiotherapy (IMRT) is medically necessary for this case for the following reason:  Rectal sparing.Marland Kitchen  PLAN:  The patient will receive 70 Gy in 28 fractions.  ________________________________  Sheral Apley Tammi Klippel, M.D.

## 2019-06-05 ENCOUNTER — Telehealth: Payer: Self-pay | Admitting: Nutrition

## 2019-06-05 ENCOUNTER — Inpatient Hospital Stay: Payer: Medicare HMO | Attending: Radiation Oncology | Admitting: Nutrition

## 2019-06-05 DIAGNOSIS — H524 Presbyopia: Secondary | ICD-10-CM | POA: Diagnosis not present

## 2019-06-05 NOTE — Telephone Encounter (Signed)
Contacted patient at cell number and home phone number with no response. I left a message with my return number and requested a call back.

## 2019-06-08 ENCOUNTER — Ambulatory Visit: Payer: Medicare HMO | Admitting: Nutrition

## 2019-06-08 ENCOUNTER — Telehealth: Payer: Self-pay | Admitting: Nutrition

## 2019-06-08 DIAGNOSIS — B353 Tinea pedis: Secondary | ICD-10-CM | POA: Diagnosis not present

## 2019-06-08 DIAGNOSIS — M2041 Other hammer toe(s) (acquired), right foot: Secondary | ICD-10-CM | POA: Diagnosis not present

## 2019-06-08 DIAGNOSIS — B351 Tinea unguium: Secondary | ICD-10-CM | POA: Diagnosis not present

## 2019-06-08 NOTE — Telephone Encounter (Signed)
Patient returned call from nutrition telephone consult on Friday.  He is a 69 year old male diagnosed with prostate cancer receiving 28 fractions of IMRT.  Past medical history includes hypertension and hypercholesterolemia.  Medications include Lipitor.  Labs were reviewed.  Height: 6 feet 5 inches. Weight: 247.6 pounds. Usual body weight: 250 pounds in 2015. BMI: 29.36.  Patient reports he is receiving radiation therapy to his prostate and wants to know the best diet during treatment.  Nutrition diagnosis: Food and nutrition related knowledge deficit related to prostate cancer and associated treatments as evidenced by no prior need for nutrition related information.  Intervention: I educated patient on the importance of a healthy plant-based diet as tolerated. Reviewed potential side effects with patient including increased gas and diarrhea. Provided education on dietary strategies if he should develop. Encouraged adequate protein and calories for weight maintenance. Encourage smaller more frequent meals and snacks. Emailed patient fact sheets.  He has my contact information.  Monitoring, evaluation, goals: Patient will tolerate adequate calories and protein with minimal side effects.  Next visit: Patient to contact me with questions or concerns.  **Disclaimer: This note was dictated with voice recognition software. Similar sounding words can inadvertently be transcribed and this note may contain transcription errors which may not have been corrected upon publication of note.**

## 2019-06-08 NOTE — Progress Notes (Signed)
See telephone notes

## 2019-06-10 DIAGNOSIS — C61 Malignant neoplasm of prostate: Secondary | ICD-10-CM | POA: Diagnosis not present

## 2019-06-11 ENCOUNTER — Other Ambulatory Visit: Payer: Self-pay

## 2019-06-11 ENCOUNTER — Ambulatory Visit
Admission: RE | Admit: 2019-06-11 | Discharge: 2019-06-11 | Disposition: A | Discharge status: Home / Self Care | Payer: Medicare HMO | Source: Ambulatory Visit | Attending: Radiation Oncology | Admitting: Radiation Oncology

## 2019-06-11 ENCOUNTER — Encounter: Payer: Self-pay | Admitting: Medical Oncology

## 2019-06-11 DIAGNOSIS — C61 Malignant neoplasm of prostate: Secondary | ICD-10-CM | POA: Diagnosis not present

## 2019-06-12 ENCOUNTER — Other Ambulatory Visit: Payer: Self-pay

## 2019-06-12 ENCOUNTER — Ambulatory Visit
Admission: RE | Admit: 2019-06-12 | Discharge: 2019-06-12 | Disposition: A | Discharge status: Home / Self Care | Payer: Medicare HMO | Source: Ambulatory Visit | Attending: Radiation Oncology | Admitting: Radiation Oncology

## 2019-06-12 DIAGNOSIS — C61 Malignant neoplasm of prostate: Secondary | ICD-10-CM | POA: Diagnosis not present

## 2019-06-15 ENCOUNTER — Other Ambulatory Visit: Payer: Self-pay

## 2019-06-15 ENCOUNTER — Ambulatory Visit
Admission: RE | Admit: 2019-06-15 | Discharge: 2019-06-15 | Disposition: A | Discharge status: Home / Self Care | Payer: Medicare HMO | Source: Ambulatory Visit | Attending: Radiation Oncology | Admitting: Radiation Oncology

## 2019-06-15 DIAGNOSIS — C61 Malignant neoplasm of prostate: Secondary | ICD-10-CM | POA: Insufficient documentation

## 2019-06-15 DIAGNOSIS — R3912 Poor urinary stream: Secondary | ICD-10-CM | POA: Diagnosis not present

## 2019-06-15 DIAGNOSIS — R300 Dysuria: Secondary | ICD-10-CM | POA: Insufficient documentation

## 2019-06-16 ENCOUNTER — Ambulatory Visit
Admission: RE | Admit: 2019-06-16 | Discharge: 2019-06-16 | Disposition: A | Discharge status: Home / Self Care | Payer: Medicare HMO | Source: Ambulatory Visit | Attending: Radiation Oncology | Admitting: Radiation Oncology

## 2019-06-16 ENCOUNTER — Other Ambulatory Visit: Payer: Self-pay

## 2019-06-16 DIAGNOSIS — C61 Malignant neoplasm of prostate: Secondary | ICD-10-CM | POA: Diagnosis not present

## 2019-06-16 DIAGNOSIS — R300 Dysuria: Secondary | ICD-10-CM | POA: Diagnosis not present

## 2019-06-16 DIAGNOSIS — R3912 Poor urinary stream: Secondary | ICD-10-CM | POA: Diagnosis not present

## 2019-06-17 ENCOUNTER — Other Ambulatory Visit: Payer: Self-pay

## 2019-06-17 ENCOUNTER — Ambulatory Visit
Admission: RE | Admit: 2019-06-17 | Discharge: 2019-06-17 | Disposition: A | Discharge status: Home / Self Care | Payer: Medicare HMO | Source: Ambulatory Visit | Attending: Radiation Oncology | Admitting: Radiation Oncology

## 2019-06-17 DIAGNOSIS — C61 Malignant neoplasm of prostate: Secondary | ICD-10-CM | POA: Diagnosis not present

## 2019-06-17 DIAGNOSIS — R3912 Poor urinary stream: Secondary | ICD-10-CM | POA: Diagnosis not present

## 2019-06-17 DIAGNOSIS — R300 Dysuria: Secondary | ICD-10-CM | POA: Diagnosis not present

## 2019-06-18 ENCOUNTER — Other Ambulatory Visit: Payer: Self-pay

## 2019-06-18 ENCOUNTER — Ambulatory Visit
Admission: RE | Admit: 2019-06-18 | Discharge: 2019-06-18 | Disposition: A | Discharge status: Home / Self Care | Payer: Medicare HMO | Source: Ambulatory Visit | Attending: Radiation Oncology | Admitting: Radiation Oncology

## 2019-06-18 DIAGNOSIS — C61 Malignant neoplasm of prostate: Secondary | ICD-10-CM | POA: Diagnosis not present

## 2019-06-18 DIAGNOSIS — R300 Dysuria: Secondary | ICD-10-CM | POA: Diagnosis not present

## 2019-06-18 DIAGNOSIS — R3912 Poor urinary stream: Secondary | ICD-10-CM | POA: Diagnosis not present

## 2019-06-19 ENCOUNTER — Ambulatory Visit
Admission: RE | Admit: 2019-06-19 | Discharge: 2019-06-19 | Disposition: A | Discharge status: Home / Self Care | Payer: Medicare HMO | Source: Ambulatory Visit | Attending: Radiation Oncology | Admitting: Radiation Oncology

## 2019-06-19 ENCOUNTER — Other Ambulatory Visit: Payer: Self-pay

## 2019-06-19 DIAGNOSIS — R3912 Poor urinary stream: Secondary | ICD-10-CM | POA: Diagnosis not present

## 2019-06-19 DIAGNOSIS — R300 Dysuria: Secondary | ICD-10-CM | POA: Diagnosis not present

## 2019-06-19 DIAGNOSIS — C61 Malignant neoplasm of prostate: Secondary | ICD-10-CM | POA: Diagnosis not present

## 2019-06-22 ENCOUNTER — Other Ambulatory Visit: Payer: Self-pay

## 2019-06-22 ENCOUNTER — Ambulatory Visit
Admission: RE | Admit: 2019-06-22 | Discharge: 2019-06-22 | Disposition: A | Discharge status: Home / Self Care | Payer: Medicare HMO | Source: Ambulatory Visit | Attending: Radiation Oncology | Admitting: Radiation Oncology

## 2019-06-22 DIAGNOSIS — R300 Dysuria: Secondary | ICD-10-CM | POA: Diagnosis not present

## 2019-06-22 DIAGNOSIS — C61 Malignant neoplasm of prostate: Secondary | ICD-10-CM | POA: Diagnosis not present

## 2019-06-22 DIAGNOSIS — R3912 Poor urinary stream: Secondary | ICD-10-CM | POA: Diagnosis not present

## 2019-06-23 ENCOUNTER — Ambulatory Visit
Admission: RE | Admit: 2019-06-23 | Discharge: 2019-06-23 | Disposition: A | Discharge status: Home / Self Care | Payer: Medicare HMO | Source: Ambulatory Visit | Attending: Radiation Oncology | Admitting: Radiation Oncology

## 2019-06-23 ENCOUNTER — Other Ambulatory Visit: Payer: Self-pay

## 2019-06-23 DIAGNOSIS — R3912 Poor urinary stream: Secondary | ICD-10-CM | POA: Diagnosis not present

## 2019-06-23 DIAGNOSIS — R300 Dysuria: Secondary | ICD-10-CM | POA: Diagnosis not present

## 2019-06-23 DIAGNOSIS — C61 Malignant neoplasm of prostate: Secondary | ICD-10-CM | POA: Diagnosis not present

## 2019-06-24 ENCOUNTER — Other Ambulatory Visit: Payer: Self-pay

## 2019-06-24 ENCOUNTER — Ambulatory Visit
Admission: RE | Admit: 2019-06-24 | Discharge: 2019-06-24 | Disposition: A | Discharge status: Home / Self Care | Payer: Medicare HMO | Source: Ambulatory Visit | Attending: Radiation Oncology | Admitting: Radiation Oncology

## 2019-06-24 DIAGNOSIS — C61 Malignant neoplasm of prostate: Secondary | ICD-10-CM | POA: Diagnosis not present

## 2019-06-24 DIAGNOSIS — R3912 Poor urinary stream: Secondary | ICD-10-CM | POA: Diagnosis not present

## 2019-06-24 DIAGNOSIS — R300 Dysuria: Secondary | ICD-10-CM | POA: Diagnosis not present

## 2019-06-25 ENCOUNTER — Other Ambulatory Visit: Payer: Self-pay

## 2019-06-25 ENCOUNTER — Ambulatory Visit: Payer: Medicare HMO

## 2019-06-25 DIAGNOSIS — C61 Malignant neoplasm of prostate: Secondary | ICD-10-CM | POA: Diagnosis not present

## 2019-06-25 DIAGNOSIS — R300 Dysuria: Secondary | ICD-10-CM | POA: Diagnosis not present

## 2019-06-25 DIAGNOSIS — R3912 Poor urinary stream: Secondary | ICD-10-CM | POA: Diagnosis not present

## 2019-06-26 ENCOUNTER — Other Ambulatory Visit: Payer: Self-pay

## 2019-06-26 ENCOUNTER — Ambulatory Visit: Payer: Medicare HMO

## 2019-06-26 DIAGNOSIS — R300 Dysuria: Secondary | ICD-10-CM | POA: Diagnosis not present

## 2019-06-26 DIAGNOSIS — R3912 Poor urinary stream: Secondary | ICD-10-CM | POA: Diagnosis not present

## 2019-06-26 DIAGNOSIS — C61 Malignant neoplasm of prostate: Secondary | ICD-10-CM | POA: Diagnosis not present

## 2019-06-29 ENCOUNTER — Other Ambulatory Visit: Payer: Self-pay

## 2019-06-29 ENCOUNTER — Ambulatory Visit
Admission: RE | Admit: 2019-06-29 | Discharge: 2019-06-29 | Disposition: A | Discharge status: Home / Self Care | Payer: Medicare HMO | Source: Ambulatory Visit | Attending: Radiation Oncology | Admitting: Radiation Oncology

## 2019-06-29 DIAGNOSIS — R300 Dysuria: Secondary | ICD-10-CM | POA: Diagnosis not present

## 2019-06-29 DIAGNOSIS — C61 Malignant neoplasm of prostate: Secondary | ICD-10-CM | POA: Diagnosis not present

## 2019-06-29 DIAGNOSIS — R3912 Poor urinary stream: Secondary | ICD-10-CM | POA: Diagnosis not present

## 2019-06-30 ENCOUNTER — Ambulatory Visit
Admission: RE | Admit: 2019-06-30 | Discharge: 2019-06-30 | Disposition: A | Discharge status: Home / Self Care | Payer: Medicare HMO | Source: Ambulatory Visit | Attending: Radiation Oncology | Admitting: Radiation Oncology

## 2019-06-30 ENCOUNTER — Other Ambulatory Visit: Payer: Self-pay

## 2019-06-30 DIAGNOSIS — C61 Malignant neoplasm of prostate: Secondary | ICD-10-CM | POA: Diagnosis not present

## 2019-06-30 DIAGNOSIS — R300 Dysuria: Secondary | ICD-10-CM | POA: Diagnosis not present

## 2019-06-30 DIAGNOSIS — R3912 Poor urinary stream: Secondary | ICD-10-CM | POA: Diagnosis not present

## 2019-07-01 ENCOUNTER — Ambulatory Visit
Admission: RE | Admit: 2019-07-01 | Discharge: 2019-07-01 | Disposition: A | Discharge status: Home / Self Care | Payer: Medicare HMO | Source: Ambulatory Visit | Attending: Radiation Oncology | Admitting: Radiation Oncology

## 2019-07-01 ENCOUNTER — Other Ambulatory Visit: Payer: Self-pay

## 2019-07-01 ENCOUNTER — Telehealth: Payer: Self-pay | Admitting: Radiation Oncology

## 2019-07-01 DIAGNOSIS — R3912 Poor urinary stream: Secondary | ICD-10-CM

## 2019-07-01 DIAGNOSIS — C61 Malignant neoplasm of prostate: Secondary | ICD-10-CM | POA: Diagnosis not present

## 2019-07-01 DIAGNOSIS — R300 Dysuria: Secondary | ICD-10-CM

## 2019-07-01 LAB — URINALYSIS, COMPLETE (UACMP) WITH MICROSCOPIC
Bacteria, UA: NONE SEEN
Bilirubin Urine: NEGATIVE
Glucose, UA: NEGATIVE mg/dL
Hgb urine dipstick: NEGATIVE
Ketones, ur: NEGATIVE mg/dL
Leukocytes,Ua: NEGATIVE
Nitrite: NEGATIVE
Protein, ur: NEGATIVE mg/dL
Specific Gravity, Urine: 1.014 (ref 1.005–1.030)
pH: 6 (ref 5.0–8.0)

## 2019-07-01 NOTE — Telephone Encounter (Signed)
-----   Message from Freeman Caldron, [place] sent at 07/01/2019  2:32 PM EST ----- Nursing will call patient to inform that the urinalysis looks normal so I will defer antibiotic unless indicated on urine culture. Urine culture results should be back by Friday at latest. In the interim, he can double up on his Flomax or take one BID, and use OTC AZO. I am happy to send a Rx for pyridium if he prefers instead of AZO.

## 2019-07-01 NOTE — Progress Notes (Signed)
Stopped by patient in the hallway this morning. Patient reports new onset of weak intermittent urine stream with dysuria. Instructed patient to present to lab for UA and culture s/p treatment today in order to rule out a UTI. Explained this RN would phone later today with results and directions. Patient verbalized understanding of all reviewed.

## 2019-07-01 NOTE — Telephone Encounter (Signed)
Phoned patient at home. No answer and no option to leave a message. Phoned patient's cell. No answer. Left detailed message that explained the following: the urinalysis looks normal so we will defer antibiotic unless indicated on urine culture. Urine culture results should be back by Friday at latest. In the interim, he can double up on his Flomax or take one BID, and use OTC AZO. Ashlyn is happy to send a Rx for pyridium if he prefers instead of AZO. Left my direct number on the patient's voicemail encouraging he call back with preferences or further questions.

## 2019-07-01 NOTE — Progress Notes (Signed)
Please call patient with normal result.  Thanks. MM 

## 2019-07-02 ENCOUNTER — Telehealth: Payer: Self-pay | Admitting: Radiation Oncology

## 2019-07-02 ENCOUNTER — Other Ambulatory Visit: Payer: Self-pay

## 2019-07-02 ENCOUNTER — Ambulatory Visit: Admission: RE | Admit: 2019-07-02 | Payer: Medicare HMO | Source: Ambulatory Visit

## 2019-07-02 DIAGNOSIS — R300 Dysuria: Secondary | ICD-10-CM | POA: Diagnosis not present

## 2019-07-02 DIAGNOSIS — C61 Malignant neoplasm of prostate: Secondary | ICD-10-CM | POA: Diagnosis not present

## 2019-07-02 DIAGNOSIS — R3912 Poor urinary stream: Secondary | ICD-10-CM | POA: Diagnosis not present

## 2019-07-02 LAB — URINE CULTURE: Culture: NO GROWTH

## 2019-07-02 NOTE — Telephone Encounter (Signed)
Received voicemail message from patient reporting thus far increasing flomax to bid has helped with his weak intermittent urine stream and dysuria. Patient verbalized appreciation for the assistance.

## 2019-07-02 NOTE — Telephone Encounter (Signed)
Received voicemail message from patient confirming receipt of my voicemail message. He explains his intent to increase flomax to one tablet in the AM and one tablet in the PM first to see if this helps relieve his weak intermittent urine stream and dysuria. Patient denies need for pyridium script just yet. Patient verbalizes understanding he can pick up AZO if needed. Patient verbalized appreciation for the assistance.

## 2019-07-03 ENCOUNTER — Ambulatory Visit
Admission: RE | Admit: 2019-07-03 | Discharge: 2019-07-03 | Disposition: A | Discharge status: Home / Self Care | Payer: Medicare HMO | Source: Ambulatory Visit | Attending: Radiation Oncology | Admitting: Radiation Oncology

## 2019-07-03 ENCOUNTER — Other Ambulatory Visit: Payer: Self-pay

## 2019-07-03 DIAGNOSIS — C61 Malignant neoplasm of prostate: Secondary | ICD-10-CM | POA: Diagnosis not present

## 2019-07-03 DIAGNOSIS — R3912 Poor urinary stream: Secondary | ICD-10-CM | POA: Diagnosis not present

## 2019-07-03 DIAGNOSIS — R300 Dysuria: Secondary | ICD-10-CM | POA: Diagnosis not present

## 2019-07-06 ENCOUNTER — Other Ambulatory Visit: Payer: Self-pay

## 2019-07-06 ENCOUNTER — Ambulatory Visit
Admission: RE | Admit: 2019-07-06 | Discharge: 2019-07-06 | Disposition: A | Discharge status: Home / Self Care | Payer: Medicare HMO | Source: Ambulatory Visit | Attending: Radiation Oncology | Admitting: Radiation Oncology

## 2019-07-06 DIAGNOSIS — R3912 Poor urinary stream: Secondary | ICD-10-CM | POA: Diagnosis not present

## 2019-07-06 DIAGNOSIS — C61 Malignant neoplasm of prostate: Secondary | ICD-10-CM | POA: Diagnosis not present

## 2019-07-06 DIAGNOSIS — R300 Dysuria: Secondary | ICD-10-CM | POA: Diagnosis not present

## 2019-07-07 ENCOUNTER — Ambulatory Visit
Admission: RE | Admit: 2019-07-07 | Discharge: 2019-07-07 | Disposition: A | Discharge status: Home / Self Care | Payer: Medicare HMO | Source: Ambulatory Visit | Attending: Radiation Oncology | Admitting: Radiation Oncology

## 2019-07-07 ENCOUNTER — Other Ambulatory Visit: Payer: Self-pay

## 2019-07-07 DIAGNOSIS — R3912 Poor urinary stream: Secondary | ICD-10-CM | POA: Diagnosis not present

## 2019-07-07 DIAGNOSIS — C61 Malignant neoplasm of prostate: Secondary | ICD-10-CM | POA: Diagnosis not present

## 2019-07-07 DIAGNOSIS — R300 Dysuria: Secondary | ICD-10-CM | POA: Diagnosis not present

## 2019-07-08 ENCOUNTER — Ambulatory Visit
Admission: RE | Admit: 2019-07-08 | Discharge: 2019-07-08 | Disposition: A | Discharge status: Home / Self Care | Payer: Medicare HMO | Source: Ambulatory Visit | Attending: Radiation Oncology | Admitting: Radiation Oncology

## 2019-07-08 ENCOUNTER — Other Ambulatory Visit: Payer: Self-pay

## 2019-07-08 DIAGNOSIS — R300 Dysuria: Secondary | ICD-10-CM | POA: Diagnosis not present

## 2019-07-08 DIAGNOSIS — R3912 Poor urinary stream: Secondary | ICD-10-CM | POA: Diagnosis not present

## 2019-07-08 DIAGNOSIS — C61 Malignant neoplasm of prostate: Secondary | ICD-10-CM | POA: Diagnosis not present

## 2019-07-09 ENCOUNTER — Other Ambulatory Visit: Payer: Self-pay

## 2019-07-09 ENCOUNTER — Ambulatory Visit
Admission: RE | Admit: 2019-07-09 | Discharge: 2019-07-09 | Disposition: A | Discharge status: Home / Self Care | Payer: Medicare HMO | Source: Ambulatory Visit | Attending: Radiation Oncology | Admitting: Radiation Oncology

## 2019-07-09 DIAGNOSIS — C61 Malignant neoplasm of prostate: Secondary | ICD-10-CM | POA: Diagnosis not present

## 2019-07-09 DIAGNOSIS — R300 Dysuria: Secondary | ICD-10-CM | POA: Diagnosis not present

## 2019-07-09 DIAGNOSIS — R3912 Poor urinary stream: Secondary | ICD-10-CM | POA: Diagnosis not present

## 2019-07-10 ENCOUNTER — Ambulatory Visit
Admission: RE | Admit: 2019-07-10 | Discharge: 2019-07-10 | Disposition: A | Discharge status: Home / Self Care | Payer: Medicare HMO | Source: Ambulatory Visit | Attending: Radiation Oncology | Admitting: Radiation Oncology

## 2019-07-10 ENCOUNTER — Other Ambulatory Visit: Payer: Self-pay

## 2019-07-10 DIAGNOSIS — R3912 Poor urinary stream: Secondary | ICD-10-CM | POA: Diagnosis not present

## 2019-07-10 DIAGNOSIS — C61 Malignant neoplasm of prostate: Secondary | ICD-10-CM | POA: Diagnosis not present

## 2019-07-10 DIAGNOSIS — R300 Dysuria: Secondary | ICD-10-CM | POA: Diagnosis not present

## 2019-07-13 ENCOUNTER — Ambulatory Visit
Admission: RE | Admit: 2019-07-13 | Discharge: 2019-07-13 | Disposition: A | Discharge status: Home / Self Care | Payer: Medicare HMO | Source: Ambulatory Visit | Attending: Radiation Oncology | Admitting: Radiation Oncology

## 2019-07-13 ENCOUNTER — Other Ambulatory Visit: Payer: Self-pay

## 2019-07-13 DIAGNOSIS — R300 Dysuria: Secondary | ICD-10-CM | POA: Diagnosis not present

## 2019-07-13 DIAGNOSIS — C61 Malignant neoplasm of prostate: Secondary | ICD-10-CM | POA: Diagnosis not present

## 2019-07-13 DIAGNOSIS — R3912 Poor urinary stream: Secondary | ICD-10-CM | POA: Diagnosis not present

## 2019-07-14 ENCOUNTER — Ambulatory Visit
Admission: RE | Admit: 2019-07-14 | Discharge: 2019-07-14 | Disposition: A | Discharge status: Home / Self Care | Payer: Medicare HMO | Source: Ambulatory Visit | Attending: Radiation Oncology | Admitting: Radiation Oncology

## 2019-07-14 ENCOUNTER — Other Ambulatory Visit: Payer: Self-pay

## 2019-07-14 DIAGNOSIS — R300 Dysuria: Secondary | ICD-10-CM | POA: Diagnosis not present

## 2019-07-14 DIAGNOSIS — R3912 Poor urinary stream: Secondary | ICD-10-CM | POA: Diagnosis not present

## 2019-07-14 DIAGNOSIS — C61 Malignant neoplasm of prostate: Secondary | ICD-10-CM | POA: Diagnosis not present

## 2019-07-15 ENCOUNTER — Other Ambulatory Visit: Payer: Self-pay

## 2019-07-15 ENCOUNTER — Ambulatory Visit
Admission: RE | Admit: 2019-07-15 | Discharge: 2019-07-15 | Disposition: A | Discharge status: Home / Self Care | Payer: Medicare HMO | Source: Ambulatory Visit | Attending: Radiation Oncology | Admitting: Radiation Oncology

## 2019-07-15 DIAGNOSIS — C61 Malignant neoplasm of prostate: Secondary | ICD-10-CM | POA: Diagnosis not present

## 2019-07-15 DIAGNOSIS — R300 Dysuria: Secondary | ICD-10-CM | POA: Diagnosis not present

## 2019-07-15 DIAGNOSIS — R3912 Poor urinary stream: Secondary | ICD-10-CM | POA: Diagnosis not present

## 2019-07-16 ENCOUNTER — Other Ambulatory Visit: Payer: Self-pay

## 2019-07-16 ENCOUNTER — Ambulatory Visit
Admission: RE | Admit: 2019-07-16 | Discharge: 2019-07-16 | Disposition: A | Discharge status: Home / Self Care | Payer: Medicare HMO | Source: Ambulatory Visit | Attending: Radiation Oncology | Admitting: Radiation Oncology

## 2019-07-16 DIAGNOSIS — R300 Dysuria: Secondary | ICD-10-CM | POA: Diagnosis not present

## 2019-07-16 DIAGNOSIS — R3912 Poor urinary stream: Secondary | ICD-10-CM | POA: Diagnosis not present

## 2019-07-16 DIAGNOSIS — C61 Malignant neoplasm of prostate: Secondary | ICD-10-CM | POA: Diagnosis not present

## 2019-07-17 ENCOUNTER — Ambulatory Visit
Admission: RE | Admit: 2019-07-17 | Discharge: 2019-07-17 | Disposition: A | Discharge status: Home / Self Care | Payer: Medicare HMO | Source: Ambulatory Visit | Attending: Radiation Oncology | Admitting: Radiation Oncology

## 2019-07-17 ENCOUNTER — Other Ambulatory Visit: Payer: Self-pay

## 2019-07-17 DIAGNOSIS — R3912 Poor urinary stream: Secondary | ICD-10-CM | POA: Diagnosis not present

## 2019-07-17 DIAGNOSIS — R300 Dysuria: Secondary | ICD-10-CM | POA: Diagnosis not present

## 2019-07-17 DIAGNOSIS — C61 Malignant neoplasm of prostate: Secondary | ICD-10-CM | POA: Diagnosis not present

## 2019-07-20 ENCOUNTER — Ambulatory Visit
Admission: RE | Admit: 2019-07-20 | Discharge: 2019-07-20 | Disposition: A | Discharge status: Home / Self Care | Payer: Medicare HMO | Source: Ambulatory Visit | Attending: Radiation Oncology | Admitting: Radiation Oncology

## 2019-07-20 ENCOUNTER — Other Ambulatory Visit: Payer: Self-pay

## 2019-07-20 ENCOUNTER — Encounter: Payer: Self-pay | Admitting: Radiation Oncology

## 2019-07-20 DIAGNOSIS — R300 Dysuria: Secondary | ICD-10-CM | POA: Diagnosis not present

## 2019-07-20 DIAGNOSIS — C61 Malignant neoplasm of prostate: Secondary | ICD-10-CM | POA: Diagnosis not present

## 2019-07-20 DIAGNOSIS — R3912 Poor urinary stream: Secondary | ICD-10-CM | POA: Diagnosis not present

## 2019-07-22 DIAGNOSIS — I10 Essential (primary) hypertension: Secondary | ICD-10-CM | POA: Diagnosis not present

## 2019-07-22 DIAGNOSIS — E7800 Pure hypercholesterolemia, unspecified: Secondary | ICD-10-CM | POA: Diagnosis not present

## 2019-07-22 DIAGNOSIS — C61 Malignant neoplasm of prostate: Secondary | ICD-10-CM | POA: Diagnosis not present

## 2019-07-24 DIAGNOSIS — C61 Malignant neoplasm of prostate: Secondary | ICD-10-CM | POA: Diagnosis not present

## 2019-07-24 DIAGNOSIS — R351 Nocturia: Secondary | ICD-10-CM | POA: Diagnosis not present

## 2019-07-28 NOTE — Progress Notes (Signed)
  Radiation Oncology         [phone] ________________________________  Name: [place] MRN: [id number]  Date: 07/20/2019  DOB: 07-Nov-1950  End of Treatment Note  Diagnosis:   69 y.o. gentleman with Stage T1c adenocarcinoma of the prostate with Gleason score of 4+3, and PSA of 5.45.    Indication for treatment:  Curative, Definitive Radiotherapy       Radiation treatment dates:   06/11/19-07/20/19  Site/dose:   The prostate was treated to 70 Gy in 28 fractions of 2.5 Gy  Beams/energy:   The patient was treated with IMRT using volumetric arc therapy delivering 6 MV X-rays to clockwise and counterclockwise circumferential arcs with a 90 degree collimator offset to avoid dose scalloping.  Image guidance was performed with daily cone beam CT prior to each fraction to align to gold markers in the prostate and assure proper bladder and rectal fill volumes.  Immobilization was achieved with BodyFix custom mold.  Narrative: The patient tolerated radiation treatment relatively well.   The patient experienced some minor urinary irritation and modest fatigue.    Plan: The patient has completed radiation treatment. He will return to radiation oncology clinic for routine followup in one month. I advised him to call or return sooner if he has any questions or concerns related to his recovery or treatment. ________________________________  Sheral Apley. Tammi Klippel, M.D.

## 2019-08-03 DIAGNOSIS — B351 Tinea unguium: Secondary | ICD-10-CM | POA: Diagnosis not present

## 2019-08-03 DIAGNOSIS — M2041 Other hammer toe(s) (acquired), right foot: Secondary | ICD-10-CM | POA: Diagnosis not present

## 2019-08-03 DIAGNOSIS — B353 Tinea pedis: Secondary | ICD-10-CM | POA: Diagnosis not present

## 2019-08-19 ENCOUNTER — Encounter: Payer: Self-pay | Admitting: Urology

## 2019-08-19 ENCOUNTER — Other Ambulatory Visit: Payer: Self-pay

## 2019-08-19 NOTE — Progress Notes (Signed)
Radiation Oncology         [phone] ________________________________  Name: [place] MRN: [id number]  Date: 08/20/2019  DOB: [birth year]  Post Treatment Note  CC: [name], MD  [name], MD  Diagnosis:   69 y.o. gentleman with Stage T1c adenocarcinoma of the prostate with Gleason score of 4+3, and PSA of 5.45.     Interval Since Last Radiation:  4.5 weeks  06/11/19-07/20/19:   The prostate was treated to 70 Gy in 28 fractions of 2.5 Gy; concurrent with ST-ADT  Narrative:  I spoke with the patient to conduct his routine scheduled 1 month follow up visit via telephone to spare the patient unnecessary potential exposure in the healthcare setting during the current COVID-19 pandemic.  The patient was notified in advance and gave permission to proceed with this visit format. He tolerated radiation treatment relatively well.   The patient experienced some minor urinary irritation and modest fatigue.                                On review of systems, the patient states that he is doing very well in general.  He reports occasional orthostatic hypotension with positional changes.  He continues with mild increased frequency, urgency and nocturia but this appears to be gradually improving.  He specifically denies dysuria, gross hematuria, incomplete bladder emptying or incontinence.  He reports a healthy appetite and is maintaining his weight.  He denies abdominal pain, nausea, vomiting, diarrhea or constipation.  He has continued to tolerate the ADT fairly well and recently noticing fewer and less intense hot flashes.  His energy level is improving as well.  Overall, he is quite pleased with his progress to date.  ALLERGIES:  is allergic to shellfish allergy.  Meds: Current Outpatient Medications  Medication Sig Dispense Refill  . amLODipine (NORVASC) 10 MG tablet Take 10 mg by mouth daily.    Marland Kitchen aspirin EC 81 MG tablet Take 81 mg by mouth daily.    Marland Kitchen atorvastatin (LIPITOR) 10 MG  tablet Take 10 mg by mouth daily.    . tamsulosin (FLOMAX) 0.4 MG CAPS capsule     . valsartan-hydrochlorothiazide (DIOVAN-HCT) 320-25 MG per tablet Take 1 tablet by mouth daily.     No current facility-administered medications for this encounter.    Physical Findings:  vitals were not taken for this visit.   /Unable to assess due to telephone follow up visit.   Lab Findings: Lab Results  Component Value Date   WBC 5.7 09/16/2013   HGB 14.0 09/16/2013   HCT 41.0 09/16/2013   MCV 91.1 09/16/2013   PLT 174 09/16/2013     Radiographic Findings: No results found.  Impression/Plan: 1. 69 y.o. gentleman with Stage T1c adenocarcinoma of the prostate with Gleason score of 4+3, and PSA of 5.45.    He will continue to follow up with urology for ongoing PSA determinations and has an appointment scheduled with Dr. Gloriann Loan on 09/02/19 for labs. He understands what to expect with regards to PSA monitoring going forward.  I have advised him to try taking the Flomax 1 in the morning and 1 in the evening to see if this helps reduce/eliminate the orthostatic hypotension that he has been experiencing.  We also discussed reducing fluid intake after dinner in the evenings to see if this would help with the nocturia.  I will look forward to following his response to treatment via  correspondence with urology, and would be happy to continue to participate in his care if clinically indicated. I talked to the patient about what to expect in the future, including his risk for erectile dysfunction and rectal bleeding. I encouraged him to call or return to the office if he has any questions regarding his previous radiation or possible radiation side effects. He was comfortable with this plan and will follow up as needed.    Nicholos Johns, PA-C

## 2019-08-20 ENCOUNTER — Other Ambulatory Visit: Payer: Self-pay

## 2019-08-20 ENCOUNTER — Ambulatory Visit
Admission: RE | Admit: 2019-08-20 | Discharge: 2019-08-20 | Disposition: A | Discharge status: Home / Self Care | Payer: Medicare HMO | Source: Ambulatory Visit | Attending: Urology | Admitting: Urology

## 2019-08-20 DIAGNOSIS — C61 Malignant neoplasm of prostate: Secondary | ICD-10-CM

## 2019-08-27 DIAGNOSIS — M7989 Other specified soft tissue disorders: Secondary | ICD-10-CM | POA: Diagnosis not present

## 2019-09-02 DIAGNOSIS — C61 Malignant neoplasm of prostate: Secondary | ICD-10-CM | POA: Diagnosis not present

## 2019-10-02 DIAGNOSIS — E7800 Pure hypercholesterolemia, unspecified: Secondary | ICD-10-CM | POA: Diagnosis not present

## 2019-10-02 DIAGNOSIS — C61 Malignant neoplasm of prostate: Secondary | ICD-10-CM | POA: Diagnosis not present

## 2019-10-02 DIAGNOSIS — I10 Essential (primary) hypertension: Secondary | ICD-10-CM | POA: Diagnosis not present

## 2019-10-02 DIAGNOSIS — E782 Mixed hyperlipidemia: Secondary | ICD-10-CM | POA: Diagnosis not present

## 2019-10-02 DIAGNOSIS — N182 Chronic kidney disease, stage 2 (mild): Secondary | ICD-10-CM | POA: Diagnosis not present

## 2019-10-14 DIAGNOSIS — R351 Nocturia: Secondary | ICD-10-CM | POA: Diagnosis not present

## 2019-10-14 DIAGNOSIS — C61 Malignant neoplasm of prostate: Secondary | ICD-10-CM | POA: Diagnosis not present

## 2019-10-19 DIAGNOSIS — B353 Tinea pedis: Secondary | ICD-10-CM | POA: Diagnosis not present

## 2019-10-19 DIAGNOSIS — B351 Tinea unguium: Secondary | ICD-10-CM | POA: Diagnosis not present

## 2019-10-19 DIAGNOSIS — M2041 Other hammer toe(s) (acquired), right foot: Secondary | ICD-10-CM | POA: Diagnosis not present

## 2019-10-22 DIAGNOSIS — N182 Chronic kidney disease, stage 2 (mild): Secondary | ICD-10-CM | POA: Diagnosis not present

## 2019-10-22 DIAGNOSIS — I10 Essential (primary) hypertension: Secondary | ICD-10-CM | POA: Diagnosis not present

## 2019-10-22 DIAGNOSIS — E782 Mixed hyperlipidemia: Secondary | ICD-10-CM | POA: Diagnosis not present

## 2019-10-22 DIAGNOSIS — C61 Malignant neoplasm of prostate: Secondary | ICD-10-CM | POA: Diagnosis not present

## 2019-10-22 DIAGNOSIS — E7800 Pure hypercholesterolemia, unspecified: Secondary | ICD-10-CM | POA: Diagnosis not present

## 2019-11-19 DIAGNOSIS — E7800 Pure hypercholesterolemia, unspecified: Secondary | ICD-10-CM | POA: Diagnosis not present

## 2019-11-19 DIAGNOSIS — C61 Malignant neoplasm of prostate: Secondary | ICD-10-CM | POA: Diagnosis not present

## 2019-11-19 DIAGNOSIS — I10 Essential (primary) hypertension: Secondary | ICD-10-CM | POA: Diagnosis not present

## 2019-11-19 DIAGNOSIS — E782 Mixed hyperlipidemia: Secondary | ICD-10-CM | POA: Diagnosis not present

## 2019-11-19 DIAGNOSIS — N182 Chronic kidney disease, stage 2 (mild): Secondary | ICD-10-CM | POA: Diagnosis not present

## 2019-12-17 ENCOUNTER — Other Ambulatory Visit: Payer: Self-pay | Admitting: Internal Medicine

## 2019-12-17 ENCOUNTER — Ambulatory Visit
Admission: RE | Admit: 2019-12-17 | Discharge: 2019-12-17 | Disposition: A | Discharge status: Home / Self Care | Payer: Medicare HMO | Source: Ambulatory Visit | Attending: Internal Medicine | Admitting: Internal Medicine

## 2019-12-17 DIAGNOSIS — R140 Abdominal distension (gaseous): Secondary | ICD-10-CM

## 2019-12-17 DIAGNOSIS — M160 Bilateral primary osteoarthritis of hip: Secondary | ICD-10-CM | POA: Diagnosis not present

## 2019-12-17 DIAGNOSIS — I878 Other specified disorders of veins: Secondary | ICD-10-CM | POA: Diagnosis not present

## 2019-12-17 DIAGNOSIS — N529 Male erectile dysfunction, unspecified: Secondary | ICD-10-CM | POA: Diagnosis not present

## 2019-12-17 DIAGNOSIS — M47816 Spondylosis without myelopathy or radiculopathy, lumbar region: Secondary | ICD-10-CM | POA: Diagnosis not present

## 2019-12-17 DIAGNOSIS — R635 Abnormal weight gain: Secondary | ICD-10-CM | POA: Diagnosis not present

## 2019-12-17 DIAGNOSIS — R5383 Other fatigue: Secondary | ICD-10-CM | POA: Diagnosis not present

## 2019-12-22 DIAGNOSIS — N182 Chronic kidney disease, stage 2 (mild): Secondary | ICD-10-CM | POA: Diagnosis not present

## 2019-12-22 DIAGNOSIS — E7800 Pure hypercholesterolemia, unspecified: Secondary | ICD-10-CM | POA: Diagnosis not present

## 2019-12-22 DIAGNOSIS — C61 Malignant neoplasm of prostate: Secondary | ICD-10-CM | POA: Diagnosis not present

## 2019-12-22 DIAGNOSIS — E782 Mixed hyperlipidemia: Secondary | ICD-10-CM | POA: Diagnosis not present

## 2019-12-22 DIAGNOSIS — I10 Essential (primary) hypertension: Secondary | ICD-10-CM | POA: Diagnosis not present

## 2020-01-19 DIAGNOSIS — C61 Malignant neoplasm of prostate: Secondary | ICD-10-CM | POA: Diagnosis not present

## 2020-01-19 DIAGNOSIS — R351 Nocturia: Secondary | ICD-10-CM | POA: Diagnosis not present

## 2020-01-22 DIAGNOSIS — I10 Essential (primary) hypertension: Secondary | ICD-10-CM | POA: Diagnosis not present

## 2020-01-22 DIAGNOSIS — Z0000 Encounter for general adult medical examination without abnormal findings: Secondary | ICD-10-CM | POA: Diagnosis not present

## 2020-01-22 DIAGNOSIS — E7800 Pure hypercholesterolemia, unspecified: Secondary | ICD-10-CM | POA: Diagnosis not present

## 2020-01-22 DIAGNOSIS — E663 Overweight: Secondary | ICD-10-CM | POA: Diagnosis not present

## 2020-01-22 DIAGNOSIS — C61 Malignant neoplasm of prostate: Secondary | ICD-10-CM | POA: Diagnosis not present

## 2020-01-22 DIAGNOSIS — N529 Male erectile dysfunction, unspecified: Secondary | ICD-10-CM | POA: Diagnosis not present

## 2020-01-25 DIAGNOSIS — E7800 Pure hypercholesterolemia, unspecified: Secondary | ICD-10-CM | POA: Diagnosis not present

## 2020-01-25 DIAGNOSIS — E782 Mixed hyperlipidemia: Secondary | ICD-10-CM | POA: Diagnosis not present

## 2020-01-25 DIAGNOSIS — N182 Chronic kidney disease, stage 2 (mild): Secondary | ICD-10-CM | POA: Diagnosis not present

## 2020-01-25 DIAGNOSIS — C61 Malignant neoplasm of prostate: Secondary | ICD-10-CM | POA: Diagnosis not present

## 2020-01-25 DIAGNOSIS — I4891 Unspecified atrial fibrillation: Secondary | ICD-10-CM | POA: Diagnosis not present

## 2020-01-25 DIAGNOSIS — I10 Essential (primary) hypertension: Secondary | ICD-10-CM | POA: Diagnosis not present

## 2020-02-03 ENCOUNTER — Emergency Department (HOSPITAL_COMMUNITY): Payer: Medicare HMO

## 2020-02-03 ENCOUNTER — Other Ambulatory Visit: Payer: Self-pay

## 2020-02-03 ENCOUNTER — Encounter (HOSPITAL_COMMUNITY): Payer: Self-pay | Admitting: Emergency Medicine

## 2020-02-03 ENCOUNTER — Inpatient Hospital Stay (HOSPITAL_COMMUNITY)
Admission: EM | Admit: 2020-02-03 | Discharge: 2020-02-07 | DRG: 312 | Disposition: A | Discharge status: Home Health | Payer: Medicare HMO | Attending: Internal Medicine | Admitting: Internal Medicine

## 2020-02-03 ENCOUNTER — Inpatient Hospital Stay (HOSPITAL_COMMUNITY): Payer: Medicare HMO

## 2020-02-03 DIAGNOSIS — N1831 Chronic kidney disease, stage 3a: Secondary | ICD-10-CM | POA: Diagnosis present

## 2020-02-03 DIAGNOSIS — U071 COVID-19: Secondary | ICD-10-CM | POA: Diagnosis not present

## 2020-02-03 DIAGNOSIS — Z833 Family history of diabetes mellitus: Secondary | ICD-10-CM | POA: Diagnosis not present

## 2020-02-03 DIAGNOSIS — Z8249 Family history of ischemic heart disease and other diseases of the circulatory system: Secondary | ICD-10-CM

## 2020-02-03 DIAGNOSIS — Z23 Encounter for immunization: Secondary | ICD-10-CM

## 2020-02-03 DIAGNOSIS — Z6832 Body mass index (BMI) 32.0-32.9, adult: Secondary | ICD-10-CM | POA: Diagnosis not present

## 2020-02-03 DIAGNOSIS — R918 Other nonspecific abnormal finding of lung field: Secondary | ICD-10-CM | POA: Diagnosis not present

## 2020-02-03 DIAGNOSIS — E876 Hypokalemia: Secondary | ICD-10-CM | POA: Diagnosis not present

## 2020-02-03 DIAGNOSIS — R7989 Other specified abnormal findings of blood chemistry: Secondary | ICD-10-CM | POA: Diagnosis not present

## 2020-02-03 DIAGNOSIS — I4891 Unspecified atrial fibrillation: Secondary | ICD-10-CM

## 2020-02-03 DIAGNOSIS — R61 Generalized hyperhidrosis: Secondary | ICD-10-CM | POA: Diagnosis not present

## 2020-02-03 DIAGNOSIS — E7849 Other hyperlipidemia: Secondary | ICD-10-CM

## 2020-02-03 DIAGNOSIS — R000 Tachycardia, unspecified: Secondary | ICD-10-CM | POA: Diagnosis not present

## 2020-02-03 DIAGNOSIS — Z79899 Other long term (current) drug therapy: Secondary | ICD-10-CM | POA: Diagnosis not present

## 2020-02-03 DIAGNOSIS — E7800 Pure hypercholesterolemia, unspecified: Secondary | ICD-10-CM | POA: Diagnosis present

## 2020-02-03 DIAGNOSIS — R55 Syncope and collapse: Secondary | ICD-10-CM

## 2020-02-03 DIAGNOSIS — I129 Hypertensive chronic kidney disease with stage 1 through stage 4 chronic kidney disease, or unspecified chronic kidney disease: Secondary | ICD-10-CM | POA: Diagnosis present

## 2020-02-03 DIAGNOSIS — S0990XA Unspecified injury of head, initial encounter: Secondary | ICD-10-CM | POA: Diagnosis not present

## 2020-02-03 DIAGNOSIS — E785 Hyperlipidemia, unspecified: Secondary | ICD-10-CM | POA: Diagnosis present

## 2020-02-03 DIAGNOSIS — I951 Orthostatic hypotension: Principal | ICD-10-CM | POA: Diagnosis present

## 2020-02-03 DIAGNOSIS — Z8546 Personal history of malignant neoplasm of prostate: Secondary | ICD-10-CM | POA: Diagnosis not present

## 2020-02-03 DIAGNOSIS — N179 Acute kidney failure, unspecified: Secondary | ICD-10-CM | POA: Diagnosis not present

## 2020-02-03 DIAGNOSIS — W19XXXA Unspecified fall, initial encounter: Secondary | ICD-10-CM | POA: Diagnosis not present

## 2020-02-03 DIAGNOSIS — M47814 Spondylosis without myelopathy or radiculopathy, thoracic region: Secondary | ICD-10-CM | POA: Diagnosis not present

## 2020-02-03 DIAGNOSIS — I10 Essential (primary) hypertension: Secondary | ICD-10-CM | POA: Diagnosis present

## 2020-02-03 DIAGNOSIS — I480 Paroxysmal atrial fibrillation: Secondary | ICD-10-CM | POA: Diagnosis not present

## 2020-02-03 DIAGNOSIS — I712 Thoracic aortic aneurysm, without rupture: Secondary | ICD-10-CM | POA: Diagnosis present

## 2020-02-03 DIAGNOSIS — I351 Nonrheumatic aortic (valve) insufficiency: Secondary | ICD-10-CM | POA: Diagnosis not present

## 2020-02-03 DIAGNOSIS — Z7982 Long term (current) use of aspirin: Secondary | ICD-10-CM

## 2020-02-03 DIAGNOSIS — Z91013 Allergy to seafood: Secondary | ICD-10-CM | POA: Diagnosis not present

## 2020-02-03 DIAGNOSIS — R231 Pallor: Secondary | ICD-10-CM | POA: Diagnosis not present

## 2020-02-03 HISTORY — DX: Syncope and collapse: R55

## 2020-02-03 HISTORY — DX: Chronic kidney disease, stage 3 unspecified: N18.30

## 2020-02-03 HISTORY — DX: Acute kidney failure, unspecified: N17.9

## 2020-02-03 HISTORY — DX: Unspecified atrial fibrillation: I48.91

## 2020-02-03 LAB — BASIC METABOLIC PANEL
Anion gap: 13 (ref 5–15)
BUN: 20 mg/dL (ref 8–23)
CO2: 23 mmol/L (ref 22–32)
Calcium: 8.7 mg/dL — ABNORMAL LOW (ref 8.9–10.3)
Chloride: 99 mmol/L (ref 98–111)
Creatinine, Ser: 1.69 mg/dL — ABNORMAL HIGH (ref 0.61–1.24)
GFR calc Af Amer: 46 mL/min — ABNORMAL LOW (ref >60)
GFR calc non Af Amer: 40 mL/min — ABNORMAL LOW (ref >60)
Glucose, Bld: 125 mg/dL — ABNORMAL HIGH (ref 70–99)
Potassium: 3.6 mmol/L (ref 3.5–5.1)
Sodium: 135 mmol/L (ref 135–145)

## 2020-02-03 LAB — CBC
HCT: 39.4 % (ref 39.0–52.0)
Hemoglobin: 12.8 g/dL — ABNORMAL LOW (ref 13.0–17.0)
MCH: 30.4 pg (ref 26.0–34.0)
MCHC: 32.5 g/dL (ref 30.0–36.0)
MCV: 93.6 fL (ref 80.0–100.0)
Platelets: 187 10*3/uL (ref 150–400)
RBC: 4.21 MIL/uL — ABNORMAL LOW (ref 4.22–5.81)
RDW: 12.9 % (ref 11.5–15.5)
WBC: 3.4 10*3/uL — ABNORMAL LOW (ref 4.0–10.5)
nRBC: 0 % (ref 0.0–0.2)

## 2020-02-03 LAB — CBC WITH DIFFERENTIAL/PLATELET
Abs Immature Granulocytes: 0 10*3/uL (ref 0.00–0.07)
Basophils Absolute: 0 10*3/uL (ref 0.0–0.1)
Basophils Relative: 0 %
Eosinophils Absolute: 0 10*3/uL (ref 0.0–0.5)
Eosinophils Relative: 0 %
HCT: 38.9 % — ABNORMAL LOW (ref 39.0–52.0)
Hemoglobin: 12.9 g/dL — ABNORMAL LOW (ref 13.0–17.0)
Immature Granulocytes: 0 %
Lymphocytes Relative: 36 %
Lymphs Abs: 1.4 10*3/uL (ref 0.7–4.0)
MCH: 30.1 pg (ref 26.0–34.0)
MCHC: 33.2 g/dL (ref 30.0–36.0)
MCV: 90.9 fL (ref 80.0–100.0)
Monocytes Absolute: 0.4 10*3/uL (ref 0.1–1.0)
Monocytes Relative: 9 %
Neutro Abs: 2.2 10*3/uL (ref 1.7–7.7)
Neutrophils Relative %: 55 %
Platelets: 183 10*3/uL (ref 150–400)
RBC: 4.28 MIL/uL (ref 4.22–5.81)
RDW: 12.7 % (ref 11.5–15.5)
WBC: 3.9 10*3/uL — ABNORMAL LOW (ref 4.0–10.5)
nRBC: 0 % (ref 0.0–0.2)

## 2020-02-03 LAB — ECHOCARDIOGRAM LIMITED
Area-P 1/2: 4.8 cm2
P 1/2 time: 476 msec
S' Lateral: 2.7 cm

## 2020-02-03 LAB — URINALYSIS, ROUTINE W REFLEX MICROSCOPIC
Bacteria, UA: NONE SEEN
Bilirubin Urine: NEGATIVE
Glucose, UA: NEGATIVE mg/dL
Ketones, ur: NEGATIVE mg/dL
Leukocytes,Ua: NEGATIVE
Nitrite: NEGATIVE
Protein, ur: 100 mg/dL — AB
Specific Gravity, Urine: 1.017 (ref 1.005–1.030)
pH: 6 (ref 5.0–8.0)

## 2020-02-03 LAB — RAPID URINE DRUG SCREEN, HOSP PERFORMED
Amphetamines: NOT DETECTED
Barbiturates: NOT DETECTED
Benzodiazepines: NOT DETECTED
Cocaine: NOT DETECTED
Opiates: NOT DETECTED
Tetrahydrocannabinol: NOT DETECTED

## 2020-02-03 LAB — BRAIN NATRIURETIC PEPTIDE: B Natriuretic Peptide: 147.2 pg/mL — ABNORMAL HIGH (ref 0.0–100.0)

## 2020-02-03 LAB — ETHANOL: Alcohol, Ethyl (B): <10 mg/dL (ref <10)

## 2020-02-03 LAB — CBG MONITORING, ED: Glucose-Capillary: 111 mg/dL — ABNORMAL HIGH (ref 70–99)

## 2020-02-03 LAB — GLUCOSE, CAPILLARY
Glucose-Capillary: 113 mg/dL — ABNORMAL HIGH (ref 70–99)
Glucose-Capillary: 128 mg/dL — ABNORMAL HIGH (ref 70–99)

## 2020-02-03 LAB — LACTIC ACID, PLASMA
Lactic Acid, Venous: 1.9 mmol/L (ref 0.5–1.9)
Lactic Acid, Venous: 2 mmol/L (ref 0.5–1.9)

## 2020-02-03 LAB — D-DIMER, QUANTITATIVE: D-Dimer, Quant: 2.2 ug/mL-FEU — ABNORMAL HIGH (ref 0.00–0.50)

## 2020-02-03 LAB — TSH: TSH: 1.459 u[IU]/mL (ref 0.350–4.500)

## 2020-02-03 LAB — CREATININE, SERUM
Creatinine, Ser: 1.38 mg/dL — ABNORMAL HIGH (ref 0.61–1.24)
GFR calc Af Amer: 59 mL/min — ABNORMAL LOW (ref >60)
GFR calc non Af Amer: 51 mL/min — ABNORMAL LOW (ref >60)

## 2020-02-03 LAB — MAGNESIUM: Magnesium: 1.9 mg/dL (ref 1.7–2.4)

## 2020-02-03 LAB — SARS CORONAVIRUS 2 BY RT PCR (HOSPITAL ORDER, PERFORMED IN ~~LOC~~ HOSPITAL LAB): SARS Coronavirus 2: POSITIVE — AB

## 2020-02-03 MED ORDER — SODIUM CHLORIDE 0.9% FLUSH
3.0000 mL | Freq: Two times a day (BID) | INTRAVENOUS | Status: DC
  Administered 2020-02-03 – 2020-02-07 (×7): 3 mL via INTRAVENOUS

## 2020-02-03 MED ORDER — HEPARIN (PORCINE) 25000 UT/250ML-% IV SOLN
1400.0000 [IU]/h | INTRAVENOUS | Status: DC
  Administered 2020-02-03: 1600 [IU]/h via INTRAVENOUS
  Administered 2020-02-04: 1400 [IU]/h via INTRAVENOUS
  Filled 2020-02-03 (×2): qty 250

## 2020-02-03 MED ORDER — ALBUTEROL SULFATE HFA 108 (90 BASE) MCG/ACT IN AERS
2.0000 | INHALATION_SPRAY | Freq: Once | RESPIRATORY_TRACT | Status: AC | PRN
  Administered 2020-02-05: 2 via RESPIRATORY_TRACT
  Filled 2020-02-03: qty 6.7

## 2020-02-03 MED ORDER — SODIUM CHLORIDE 0.9 % IV SOLN
1200.0000 mg | Freq: Once | INTRAVENOUS | Status: AC
  Administered 2020-02-03: 1200 mg via INTRAVENOUS
  Filled 2020-02-03 (×2): qty 10

## 2020-02-03 MED ORDER — SODIUM CHLORIDE 0.9 % IV BOLUS
250.0000 mL | Freq: Once | INTRAVENOUS | Status: AC
  Administered 2020-02-03: 250 mL via INTRAVENOUS

## 2020-02-03 MED ORDER — EPINEPHRINE 0.3 MG/0.3ML IJ SOAJ
0.3000 mg | Freq: Once | INTRAMUSCULAR | Status: DC | PRN
  Filled 2020-02-03: qty 0.6

## 2020-02-03 MED ORDER — DIPHENHYDRAMINE HCL 50 MG/ML IJ SOLN: 50.0000 mg | Freq: Once | INTRAMUSCULAR | Status: DC | PRN

## 2020-02-03 MED ORDER — IOHEXOL 350 MG/ML SOLN
100.0000 mL | Freq: Once | INTRAVENOUS | Status: AC | PRN
  Administered 2020-02-03: 75 mL via INTRAVENOUS

## 2020-02-03 MED ORDER — FAMOTIDINE IN NACL 20-0.9 MG/50ML-% IV SOLN
20.0000 mg | Freq: Once | INTRAVENOUS | Status: DC | PRN
  Filled 2020-02-03: qty 50

## 2020-02-03 MED ORDER — ACETAMINOPHEN 325 MG PO TABS: 650.0000 mg | ORAL_TABLET | Freq: Four times a day (QID) | ORAL | Status: DC | PRN

## 2020-02-03 MED ORDER — ACETAMINOPHEN 650 MG RE SUPP: 650.0000 mg | Freq: Four times a day (QID) | RECTAL | Status: DC | PRN

## 2020-02-03 MED ORDER — DILTIAZEM LOAD VIA INFUSION
10.0000 mg | Freq: Once | INTRAVENOUS | Status: AC
  Administered 2020-02-03: 10 mg via INTRAVENOUS
  Filled 2020-02-03: qty 10

## 2020-02-03 MED ORDER — DILTIAZEM HCL-DEXTROSE 125-5 MG/125ML-% IV SOLN (PREMIX)
5.0000 mg/h | INTRAVENOUS | Status: DC
  Administered 2020-02-03: 10 mg/h via INTRAVENOUS
  Filled 2020-02-03: qty 125

## 2020-02-03 MED ORDER — ONDANSETRON HCL 4 MG PO TABS: 4.0000 mg | ORAL_TABLET | Freq: Four times a day (QID) | ORAL | Status: DC | PRN

## 2020-02-03 MED ORDER — HEPARIN BOLUS VIA INFUSION
5500.0000 [IU] | Freq: Once | INTRAVENOUS | Status: AC
  Administered 2020-02-03: 5500 [IU] via INTRAVENOUS
  Filled 2020-02-03: qty 5500

## 2020-02-03 MED ORDER — SODIUM CHLORIDE 0.9 % IV SOLN: INTRAVENOUS | Status: DC

## 2020-02-03 MED ORDER — SODIUM CHLORIDE 0.9 % IV BOLUS
1000.0000 mL | Freq: Once | INTRAVENOUS | Status: AC
  Administered 2020-02-03: 1000 mL via INTRAVENOUS

## 2020-02-03 MED ORDER — ATORVASTATIN CALCIUM 10 MG PO TABS
20.0000 mg | ORAL_TABLET | Freq: Every day | ORAL | Status: DC
  Administered 2020-02-04 – 2020-02-07 (×4): 20 mg via ORAL
  Filled 2020-02-03 (×4): qty 2

## 2020-02-03 MED ORDER — DILTIAZEM HCL 60 MG PO TABS
30.0000 mg | ORAL_TABLET | Freq: Four times a day (QID) | ORAL | Status: DC
  Administered 2020-02-03 – 2020-02-05 (×8): 30 mg via ORAL
  Filled 2020-02-03 (×11): qty 1

## 2020-02-03 MED ORDER — OXYCODONE HCL 5 MG PO TABS
5.0000 mg | ORAL_TABLET | ORAL | Status: DC | PRN
  Administered 2020-02-04 – 2020-02-06 (×5): 5 mg via ORAL
  Filled 2020-02-03 (×5): qty 1

## 2020-02-03 MED ORDER — ASPIRIN EC 81 MG PO TBEC
81.0000 mg | DELAYED_RELEASE_TABLET | Freq: Every day | ORAL | Status: DC
  Administered 2020-02-03 – 2020-02-07 (×5): 81 mg via ORAL
  Filled 2020-02-03 (×5): qty 1

## 2020-02-03 MED ORDER — ENOXAPARIN SODIUM 40 MG/0.4ML ~~LOC~~ SOLN: 40.0000 mg | SUBCUTANEOUS | Status: DC

## 2020-02-03 MED ORDER — METHYLPREDNISOLONE SODIUM SUCC 125 MG IJ SOLR: 125.0000 mg | Freq: Once | INTRAMUSCULAR | Status: DC | PRN

## 2020-02-03 MED ORDER — SODIUM CHLORIDE 0.9 % IV SOLN: INTRAVENOUS | Status: DC | PRN

## 2020-02-03 MED ORDER — ONDANSETRON HCL 4 MG/2ML IJ SOLN: 4.0000 mg | Freq: Four times a day (QID) | INTRAMUSCULAR | Status: DC | PRN

## 2020-02-03 NOTE — Progress Notes (Addendum)
CRITICAL VALUE ALERT  Critical Value:  Lactic Acid 2.0  Date & Time Notied:  19:24  Provider Notified: T. Opyd  Orders Received/Actions taken: NS 250 ml bolus ordered.

## 2020-02-03 NOTE — Plan of Care (Signed)
Initiated

## 2020-02-03 NOTE — ED Notes (Signed)
Patient returned from CT

## 2020-02-03 NOTE — H&P (Addendum)
History and Physical    Kevin Merritt ZOX:[id number] DOB: 03/10/1951 DOA: 02/03/2020  PCP: [name], MD  Patient coming from: home  I have personally briefly reviewed patient's old medical records in [place]  Chief Complaint: syncope  HPI: Kevin Merritt is a 69 y.o. male with medical history significant of hypertension, hyperlipidemia, prostate cancer-on IV testosterone injection every 2-month, morbid obesity presents to emergency department for evaluation of syncope.  Patient tells me that he got up this morning to go to bathroom, initially he felt dizzy.  He went to bathroom and when he stood up and suddenly he blacked out and fell on the floor.  He denies head trauma, seizures, headache, blurry vision, nausea, vomiting, chest pain, palpitation or leg swelling.  Reports exertional shortness of breath however denies orthopnea, PND, history of CHF, fever, chills, cough, congestion, bowel changes.  He has chronic urinary symptoms due to history of prostate cancer.  He lives with his wife at home.  No history of smoking, alcohol, listed drug use.  He is not vaccinated against COVID-19 and have not decided to get 1.  No history of recent travel, history of PE/DVT.  ED Course: Upon arrival to ED: Patient tachycardic, tachypneic, CBC shows leukopenia of 3.9, CMP shows AKI on CKD stage III, COVID-19 came back positive.  UA, UDS, ethanol, magnesium level: WNL, D-dimer: 2.20.  CT angio of chest came back negative for PE.  EKG shows A. fib.  Patient started on Cardizem drip and received IV fluid in ED as orthostatic vitals were positive.  [facility] hospital[facility] consulted for admission due to syncope and new onset A. Fib.  Review of Systems: As per HPI otherwise negative.    Past Medical History:  Diagnosis Date  . High cholesterol   . Hypertension   . Prostate cancer [facility])     Past Surgical History:  Procedure Laterality Date  . COLONOSCOPY W/ POLYPECTOMY  2012  . HERNIA REPAIR  Right    Age 89  . NM MYOVIEW LTD  10/15/2013   7 min, 8.5 METs, 57%, ASx ST depressions - No ischemia / infarction.  . THYROIDECTOMY     Age 69  . TONSILLECTOMY     Age 56  . TRANSTHORACIC ECHOCARDIOGRAM  10/28/2013   Nl LV Size & function; EF 65-70%, mod Conc LVH with Gd 2 DD     reports that he has never smoked. He has never used smokeless tobacco. He reports that he does not drink alcohol and does not use drugs.  Allergies  Allergen Reactions  . Shellfish Allergy Nausea And Vomiting    Family History  Problem Relation Age of Onset  . Hypertension Mother   . Heart failure Mother   . Diabetes Sister 88  . Hypertension Sister     Prior to Admission medications   Medication Sig Start Date End Date Taking? Authorizing Provider  amLODipine (NORVASC) 10 MG tablet Take 10 mg by mouth daily.    [provider]  aspirin EC 81 MG tablet Take 81 mg by mouth daily.    [provider]  atorvastatin (LIPITOR) 10 MG tablet Take 10 mg by mouth daily.    [provider]  tamsulosin (FLOMAX) 0.4 MG CAPS capsule  01/30/19   [provider]  valsartan-hydrochlorothiazide (DIOVAN-HCT) 320-25 MG per tablet Take 1 tablet by mouth daily.    [provider]    Physical Exam: Vitals:   02/03/20 0600 02/03/20 0615 02/03/20 0630 02/03/20 0645  BP:  123/73 137/68 (!) 152/80 (!) 143/73  Pulse: 79 88 (!) 119 94  Resp: 14 14 (!) 30 (!) 25  Temp:      TempSrc:      SpO2: 98% 100% 98% 98%    Constitutional: NAD, calm, comfortable, on room air, obese, communicating well Eyes: PERRL, lids and conjunctivae normal ENMT: Mucous membranes are moist. Posterior pharynx clear of any exudate or lesions.Normal dentition.  Neck: normal, supple, no masses, no thyromegaly Respiratory: clear to auscultation bilaterally, no wheezing, no crackles. Normal respiratory effort. No accessory muscle use.  Cardiovascular: Irregularly irregular rhythm no murmurs / rubs / gallops.  No extremity edema. 2+ pedal pulses. No carotid bruits.  Abdomen: no tenderness, no masses palpated. No hepatosplenomegaly. Bowel sounds positive.  Musculoskeletal: no clubbing / cyanosis. No joint deformity upper and lower extremities. Good ROM, no contractures. Normal muscle tone.  Skin: no rashes, lesions, ulcers. No induration Neurologic: CN 2-12 grossly intact. Sensation intact, DTR normal. Strength 5/5 in all 4.  Psychiatric: Normal judgment and insight. Alert and oriented x 3. Normal mood.    Labs on Admission: I have personally reviewed following labs and imaging studies  CBC: Recent Labs  Lab 02/03/20 0426  WBC 3.9*  NEUTROABS 2.2  HGB 12.9*  HCT 38.9*  MCV 90.9  PLT 767   Basic Metabolic Panel: Recent Labs  Lab 02/03/20 0426 02/03/20 0515  NA 135  --   K 3.6  --   CL 99  --   CO2 23  --   GLUCOSE 125*  --   BUN 20  --   CREATININE 1.69*  --   CALCIUM 8.7*  --   MG  --  1.9   GFR: CrCl cannot be calculated (Unknown ideal weight.). Liver Function Tests: No results for input(s): AST, ALT, ALKPHOS, BILITOT, PROT, ALBUMIN in the last 168 hours. No results for input(s): LIPASE, AMYLASE in the last 168 hours. No results for input(s): AMMONIA in the last 168 hours. Coagulation Profile: No results for input(s): INR, PROTIME in the last 168 hours. Cardiac Enzymes: No results for input(s): CKTOTAL, CKMB, CKMBINDEX, TROPONINI in the last 168 hours. BNP (last 3 results) No results for input(s): PROBNP in the last 8760 hours. HbA1C: No results for input(s): HGBA1C in the last 72 hours. CBG: Recent Labs  Lab 02/03/20 0430  GLUCAP 111*   Lipid Profile: No results for input(s): CHOL, HDL, LDLCALC, TRIG, CHOLHDL, LDLDIRECT in the last 72 hours. Thyroid Function Tests: No results for input(s): TSH, T4TOTAL, FREET4, T3FREE, THYROIDAB in the last 72 hours. Anemia Panel: No results for input(s): VITAMINB12, FOLATE, FERRITIN, TIBC, IRON, RETICCTPCT in the last 72  hours. Urine analysis:    Component Value Date/Time   COLORURINE YELLOW 02/03/2020 0504   APPEARANCEUR HAZY (A) 02/03/2020 0504   LABSPEC 1.017 02/03/2020 0504   PHURINE 6.0 02/03/2020 0504   GLUCOSEU NEGATIVE 02/03/2020 0504   HGBUR SMALL (A) 02/03/2020 0504   BILIRUBINUR NEGATIVE 02/03/2020 0504   KETONESUR NEGATIVE 02/03/2020 0504   PROTEINUR 100 (A) 02/03/2020 0504   UROBILINOGEN 0.2 06/09/2011 1158   NITRITE NEGATIVE 02/03/2020 0504   LEUKOCYTESUR NEGATIVE 02/03/2020 0504    Radiological Exams on Admission: CT Head Wo Contrast  Result Date: 02/03/2020 CLINICAL DATA:  Syncope with minor head trauma EXAM: CT HEAD WITHOUT CONTRAST TECHNIQUE: Contiguous axial images were obtained from the base of the skull through the vertex without intravenous contrast. COMPARISON:  None. FINDINGS: Brain: No evidence of acute infarction, hemorrhage, hydrocephalus, extra-axial  collection or mass lesion/mass effect. Vascular: No hyperdense vessel or unexpected calcification. Skull: Normal. Negative for fracture or focal lesion. Sinuses/Orbits: Partially covered right maxillary sinus which is essentially completely opacified where seen. There is internal high and low-density components likely from mucosal edema and trapped secretions. IMPRESSION: 1. No evidence of intracranial injury. 2. Essentially complete opacification of the right maxillary sinus that is new from a 2010 neck CT Electronically Signed   By: Monte Fantasia M.D.   On: 02/03/2020 04:59   CT Angio Chest PE W and/or Wo Contrast  Result Date: 02/03/2020 CLINICAL DATA:  Syncope, tachycardia, dizziness, elevated D-dimer EXAM: CT ANGIOGRAPHY CHEST WITH CONTRAST TECHNIQUE: Multidetector CT imaging of the chest was performed using the standard protocol during bolus administration of intravenous contrast. Multiplanar CT image reconstructions and MIPs were obtained to evaluate the vascular anatomy. CONTRAST:  62mL OMNIPAQUE IOHEXOL 350 MG/ML SOLN  COMPARISON:  None. FINDINGS: Cardiovascular: There is excellent opacification of the pulmonary arterial tree and there is no intraluminal filling defect identified to suggest acute pulmonary embolism. The central pulmonary arteries are enlarged in keeping with changes of pulmonary arterial hypertension. There is mild coronary artery calcification. There is mild global cardiomegaly with severe left ventricular hypertrophy. There is mild aneurysmal dilation of the a thoracic aorta which measures 4.3 cm in greatest dimension in its ascending segment. The aorta assumes a more normal caliber in its descending segment measuring 3.2 cm just beyond the takeoff of the left subclavian artery and 2.9 cm at the level of the left atrium. Mediastinum/Nodes: There is nodular enlargement of the right thyroid gland, not well assessed on this examination and new since prior neck CT examination of 04/29/2009. No pathologic thoracic adenopathy. The esophagus is unremarkable. Lungs/Pleura: Mild bibasilar atelectasis. There is mild thickening of the peribronchovascular interstitium likely representing trace bilateral perihilar pulmonary edema. No confluent pulmonary infiltrate. No pneumothorax or pleural effusion. The central airways are widely patent. Upper Abdomen: Unremarkable Musculoskeletal: No acute bone abnormality. Review of the MIP images confirms the above findings. IMPRESSION: No pulmonary embolism. Severe left ventricular hypertrophy. Pulmonary arterial hypertension, possibly the result of diastolic dysfunction,. This could be better assessed with echocardiography, if indicated. Trace perihilar pulmonary edema, likely cardiogenic in nature. 4.3 cm ascending aortic aneurysm. Right thyroid nodularity, poorly assessed on this examination. This could be better assessed with dedicated thyroid sonography once the patient's acute issues have resolved, if clinically indicated. Aortic Atherosclerosis (ICD10-I70.0). Aortic aneurysm NOS  (ICD10-I71.9). Electronically Signed   By: Fidela Salisbury MD   On: 02/03/2020 07:02    EKG: Independently reviewed.  A. fib, borderline prolonged QT interval.  No ST elevation or depression noted.  Assessment/Plan Principal Problem:   Syncope Active Problems:   Essential hypertension   HLD (hyperlipidemia)   New onset atrial fibrillation (HCC)   AKI (acute kidney injury) ([place])    Syncope: -Secondary to orthostatic hypotension and arrhythmias. -Patient presented with tachycardia, tachypnea, afebrile, on room air, initial D-dimer: 2.20, CT angio came back negative for PE.  Orthostatic vitals positive in ED.  Patient received IV fluid in ED. -Admit patient stepdown unit for close monitoring. -UA, UDS, ethanol, magnesium: WNL. -On telemetry.  Will check TSH, transthoracic echo and carotid Doppler ultrasound. -Consult PT/OT.  Neurochecks. -On fall precautions.  New onset A. fib: Reviewed EKG. -Patient started on Cardizem gtt. in ED.  Continue same.  On telemetry. -Check TSH.  Will get transthoracic echo. -CHA2DS2-VASc score: 2.  Start patient on heparin gtt. [facility] cardiology  COVID-19 positive: Incidental finding.  Patient denies any respiratory symptoms.  Maintaining oxygen saturation on room air.  CT angio of chest came back negative for PE/pneumonia. -We will order monoclonal antibody infusion-discussed with the patient and he agreed.  Severe LVH: Noted on CTA chest.  Check BNP.  Get transthoracic echo to check for CHF.  Ascending aorta aneurysm: Noted on CT chest.  4.3 cm -Follow-up outpatient  Thyroid nodularity: Noted on CT chest.  Check TSH.  Follow-up outpatient.  Hypertension: -Blood pressure is stable.  Will hold amlodipine, losartan-HCTZ for now due to orthostatic hypotension.  Monitor blood pressure closely.  Hyperlipidemia: Continue statin  AKI on CKD stage III: -Creatinine: 1.69, GFR: 46 -Received IV fluids in ED.  Hold nephrotoxic medication  valsartan-HCTZ for now.  Monitor kidney function closely.  History of prostate cancer: Hold Flomax due to orthostatic vitals.  DVT prophylaxis: Heparin/SCD Code Status: Full code Family Communication: Patient's wife present at bedside.  Plan of care discussed with patient in length and he verbalized understanding and agreed with it. Disposition Plan: Home in 1 to 2 days Consults called: Cardiology Admission status: Inpatient   Mckinley Jewel MD [facility]  If 7PM-7AM, please contact night-coverage [email or web address] Password [facility]  02/03/2020, 7:20 AM

## 2020-02-03 NOTE — ED Notes (Signed)
Spouse and Dr. Bridgett Larsson updated pt is Covid +

## 2020-02-03 NOTE — ED Provider Notes (Signed)
[facility] EMERGENCY DEPARTMENT Provider Note   CSN: [id number] Arrival date & time: 02/03/20  0415     History Chief Complaint  Patient presents with  . Loss of Consciousness    Kevin Merritt is a 69 y.o. male.  HPI     This is a 69 year old male with a history of hypertension, prostate cancer, hypercholesterolemia who presents with syncope. Patient reports that he got up this morning to go to the bathroom. He felt dizzy when he got up. He describes the dizziness as lightheadedness. No room spinning. He went to the bathroom and "nothing happened." He states that he stood up and the next thing that he knew he was on the floor. He is unsure whether he hit his head. He landed on his back. He is reporting slight headache. He states over the last several days he has felt "somewhat off." He currently is experiencing palpitations but no chest pain. He has not previously experienced palpitations and has no history of arrhythmia. He does report occasional shortness of breath which is not new for him. He has not noted any leg swelling or history of blood clots. He is not on any blood thinners. Denies any recent fevers, cough, sick contacts, Covid exposures. He is not vaccinated against COVID-19.  Past Medical History:  Diagnosis Date  . High cholesterol   . Hypertension   . Prostate cancer [facility])     Patient Active Problem List   Diagnosis Date Noted  . Syncope 02/03/2020  . New onset atrial fibrillation ([place]) 02/03/2020  . AKI (acute kidney injury) ([place]) 02/03/2020  . Prostate cancer ([place]) 03/10/2019  . DOE (dyspnea on exertion) 10/04/2013  . Essential hypertension 10/04/2013  . Chest pain with low risk for cardiac etiology 10/04/2013  . HLD (hyperlipidemia)     Past Surgical History:  Procedure Laterality Date  . COLONOSCOPY W/ POLYPECTOMY  2012  . HERNIA REPAIR Right    Age 82  . NM MYOVIEW LTD  10/15/2013   7 min, 8.5 METs, 57%, ASx ST depressions - No  ischemia / infarction.  . THYROIDECTOMY     Age 9  . TONSILLECTOMY     Age 50  . TRANSTHORACIC ECHOCARDIOGRAM  10/28/2013   Nl LV Size & function; EF 65-70%, mod Conc LVH with Gd 2 DD       Family History  Problem Relation Age of Onset  . Hypertension Mother   . Heart failure Mother   . Diabetes Sister 59  . Hypertension Sister     Social History   Tobacco Use  . Smoking status: Never Smoker  . Smokeless tobacco: Never Used  Substance Use Topics  . Alcohol use: No  . Drug use: No    Home Medications Prior to Admission medications   Medication Sig Start Date End Date Taking? Authorizing Provider  amLODipine (NORVASC) 10 MG tablet Take 10 mg by mouth daily.    [provider]  aspirin EC 81 MG tablet Take 81 mg by mouth daily.    [provider]  atorvastatin (LIPITOR) 10 MG tablet Take 10 mg by mouth daily.    [provider]  tamsulosin (FLOMAX) 0.4 MG CAPS capsule  01/30/19   [provider]  valsartan-hydrochlorothiazide (DIOVAN-HCT) 320-25 MG per tablet Take 1 tablet by mouth daily.    [provider]    Allergies    Shellfish allergy  Review of Systems   Review of Systems  Constitutional: Negative for fever.  Respiratory: Positive for shortness of breath. Negative for cough.   Cardiovascular: Positive for palpitations. Negative for chest pain and leg swelling.  Gastrointestinal: Negative for anal bleeding, nausea and vomiting.  Genitourinary: Negative for dysuria.  Neurological: Positive for dizziness and headaches.  All other systems reviewed and are negative.   Physical Exam Updated Vital Signs BP (!) 143/73   Pulse 94   Temp 98.4 F (36.9 C) (Oral)   Resp (!) 25   SpO2 98%   Physical Exam Vitals and nursing note reviewed.  Constitutional:      Appearance: He is well-developed. He is not ill-appearing.  HENT:     Head: Normocephalic and atraumatic.     Nose: Nose normal.     Mouth/Throat:      Mouth: Mucous membranes are moist.  Eyes:     Pupils: Pupils are equal, round, and reactive to light.  Cardiovascular:     Rate and Rhythm: Tachycardia present. Rhythm irregular.     Heart sounds: Normal heart sounds. No murmur heard.   Pulmonary:     Effort: Pulmonary effort is normal. No respiratory distress.     Breath sounds: Normal breath sounds. No wheezing.  Abdominal:     General: Bowel sounds are normal.     Palpations: Abdomen is soft.     Tenderness: There is no abdominal tenderness. There is no rebound.  Musculoskeletal:        General: No tenderness.     Cervical back: Neck supple.     Right lower leg: No edema.     Left lower leg: No edema.  Lymphadenopathy:     Cervical: No cervical adenopathy.  Skin:    General: Skin is warm and dry.  Neurological:     Mental Status: He is alert and oriented to person, place, and time.     Comments: Cranial nerves II through XII intact, 5 out of 5 strength in all 4 extremities, no dysmetria to finger-nose-finger  Psychiatric:        Mood and Affect: Mood normal.     ED Results / Procedures / Treatments   Labs (all labs ordered are listed, but only abnormal results are displayed) Labs Reviewed  CBC WITH DIFFERENTIAL/PLATELET - Abnormal; Notable for the following components:      Result Value   WBC 3.9 (*)    Hemoglobin 12.9 (*)    HCT 38.9 (*)    All other components within normal limits  BASIC METABOLIC PANEL - Abnormal; Notable for the following components:   Glucose, Bld 125 (*)    Creatinine, Ser 1.69 (*)    Calcium 8.7 (*)    GFR calc non Af Amer 40 (*)    GFR calc Af Amer 46 (*)    All other components within normal limits  URINALYSIS, ROUTINE W REFLEX MICROSCOPIC - Abnormal; Notable for the following components:   APPearance HAZY (*)    Hgb urine dipstick SMALL (*)    Protein, ur 100 (*)    All other components within normal limits  D-DIMER, QUANTITATIVE (NOT AT [facility]) - Abnormal; Notable for the following  components:   D-Dimer, Quant 2.20 (*)    All other components within normal limits  CBG MONITORING, ED - Abnormal; Notable for the following components:   Glucose-Capillary 111 (*)    All other components within normal limits  SARS CORONAVIRUS 2 BY RT PCR (HOSPITAL ORDER, [facility])  RAPID URINE DRUG SCREEN, HOSP PERFORMED  ETHANOL  MAGNESIUM  CBC  CREATININE, SERUM  TSH  BRAIN NATRIURETIC PEPTIDE  LACTIC ACID, PLASMA  LACTIC ACID, PLASMA    EKG EKG Interpretation  Date/Time:  Wednesday February 03 2020 04:19:47 EDT Ventricular Rate:  109 PR Interval:    QRS Duration: 109 QT Interval:  362 QTC Calculation: 488 R Axis:   75 Text Interpretation: Atrial fibrillation Borderline repolarization abnormality Borderline prolonged QT interval When compared with ECG of 09/16/2013, Atrial fibrillation has replaced Sinus rhythm QT has lengthened Confirmed by Delora Fuel ([id number]) on 02/03/2020 4:33:07 AM   Radiology CT Head Wo Contrast  Result Date: 02/03/2020 CLINICAL DATA:  Syncope with minor head trauma EXAM: CT HEAD WITHOUT CONTRAST TECHNIQUE: Contiguous axial images were obtained from the base of the skull through the vertex without intravenous contrast. COMPARISON:  None. FINDINGS: Brain: No evidence of acute infarction, hemorrhage, hydrocephalus, extra-axial collection or mass lesion/mass effect. Vascular: No hyperdense vessel or unexpected calcification. Skull: Normal. Negative for fracture or focal lesion. Sinuses/Orbits: Partially covered right maxillary sinus which is essentially completely opacified where seen. There is internal high and low-density components likely from mucosal edema and trapped secretions. IMPRESSION: 1. No evidence of intracranial injury. 2. Essentially complete opacification of the right maxillary sinus that is new from a 2010 neck CT Electronically Signed   By: Monte Fantasia M.D.   On: 02/03/2020 04:59   CT Angio Chest PE W and/or  Wo Contrast  Result Date: 02/03/2020 CLINICAL DATA:  Syncope, tachycardia, dizziness, elevated D-dimer EXAM: CT ANGIOGRAPHY CHEST WITH CONTRAST TECHNIQUE: Multidetector CT imaging of the chest was performed using the standard protocol during bolus administration of intravenous contrast. Multiplanar CT image reconstructions and MIPs were obtained to evaluate the vascular anatomy. CONTRAST:  10mL OMNIPAQUE IOHEXOL 350 MG/ML SOLN COMPARISON:  None. FINDINGS: Cardiovascular: There is excellent opacification of the pulmonary arterial tree and there is no intraluminal filling defect identified to suggest acute pulmonary embolism. The central pulmonary arteries are enlarged in keeping with changes of pulmonary arterial hypertension. There is mild coronary artery calcification. There is mild global cardiomegaly with severe left ventricular hypertrophy. There is mild aneurysmal dilation of the a thoracic aorta which measures 4.3 cm in greatest dimension in its ascending segment. The aorta assumes a more normal caliber in its descending segment measuring 3.2 cm just beyond the takeoff of the left subclavian artery and 2.9 cm at the level of the left atrium. Mediastinum/Nodes: There is nodular enlargement of the right thyroid gland, not well assessed on this examination and new since prior neck CT examination of 04/29/2009. No pathologic thoracic adenopathy. The esophagus is unremarkable. Lungs/Pleura: Mild bibasilar atelectasis. There is mild thickening of the peribronchovascular interstitium likely representing trace bilateral perihilar pulmonary edema. No confluent pulmonary infiltrate. No pneumothorax or pleural effusion. The central airways are widely patent. Upper Abdomen: Unremarkable Musculoskeletal: No acute bone abnormality. Review of the MIP images confirms the above findings. IMPRESSION: No pulmonary embolism. Severe left ventricular hypertrophy. Pulmonary arterial hypertension, possibly the result of diastolic  dysfunction,. This could be better assessed with echocardiography, if indicated. Trace perihilar pulmonary edema, likely cardiogenic in nature. 4.3 cm ascending aortic aneurysm. Right thyroid nodularity, poorly assessed on this examination. This could be better assessed with dedicated thyroid sonography once the patient's acute issues have resolved, if clinically indicated. Aortic Atherosclerosis (ICD10-I70.0). Aortic aneurysm NOS (ICD10-I71.9). Electronically Signed   By: Fidela Salisbury MD   On: 02/03/2020 07:02    Procedures Procedures (including critical care time)  Medications Ordered in ED  Medications  diltiazem (CARDIZEM) 1 mg/mL load via infusion 10 mg (10 mg Intravenous Bolus from Bag 02/03/20 0550)    And  diltiazem (CARDIZEM) 125 mg in dextrose 5% 125 mL (1 mg/mL) infusion (10 mg/hr Intravenous New Bag/Given 02/03/20 0548)  sodium chloride flush (NS) 0.9 % injection 3 mL (has no administration in time range)  enoxaparin (LOVENOX) injection 40 mg (has no administration in time range)  acetaminophen (TYLENOL) tablet 650 mg (has no administration in time range)    Or  acetaminophen (TYLENOL) suppository 650 mg (has no administration in time range)  0.9 %  sodium chloride infusion (has no administration in time range)  oxyCODONE (Oxy IR/ROXICODONE) immediate release tablet 5 mg (has no administration in time range)  ondansetron (ZOFRAN) tablet 4 mg (has no administration in time range)    Or  ondansetron (ZOFRAN) injection 4 mg (has no administration in time range)  sodium chloride 0.9 % bolus 1,000 mL (1,000 mLs Intravenous New Bag/Given 02/03/20 0548)  iohexol (OMNIPAQUE) 350 MG/ML injection 100 mL (75 mLs Intravenous Contrast Given 02/03/20 8101)    ED Course  I have reviewed the triage vital signs and the nursing notes.  Pertinent labs & imaging results that were available during my care of the patient were reviewed by me and considered in my medical decision making (see chart for  details).    MDM Rules/Calculators/A&P                          This patients CHA2DS2-VASc Score and unadjusted Ischemic Stroke Rate (% per year) is equal to 2.2 % stroke rate/year from a score of 2  Above score calculated as 1 point each if present [CHF, HTN, DM, Vascular=MI/PAD/Aortic Plaque, Age if 65-74, or Male] Above score calculated as 2 points each if present [Age > 75, or Stroke/TIA/TE]  Patient presents with an episode of syncope.  Reports prodromal dizziness and not feeling well over the last several days.  On my evaluation he is in atrial fibrillation with RVR with rates in the 120s.  He was in the 150s for EMS.  No prior history.  Clinically appears somewhat dry.  Does have a history of prostate cancer as well which would put him at risk for PE.  Will send a D-dimer.  Patient was started on fluids and a diltiazem drip for rate control.  Other lab work obtained and notable for creatinine of 1.69.  This is acutely increased from baseline.  Patient also orthostatic by vital signs.  Suspect dehydration.  D-dimer is positive.  Will obtain CTA of the chest to rule out PE.  This is negative for acute PE.  On recheck, patient is rate controlled with rates in the 90s to 100.  Given his AKI, new onset atrial fibrillation, feel he warrants admission for further management.  He is currently on a diltiazem drip.    Final Clinical Impression(s) / ED Diagnoses Final diagnoses:  Atrial fibrillation with RVR ([place])  Syncope, unspecified syncope type  Orthostasis  AKI (acute kidney injury) [facility])    Rx / DC Orders ED Discharge Orders         Ordered    Amb referral to AFIB Clinic        02/03/20 0509           Merryl Hacker, MD 02/03/20 [phone]

## 2020-02-03 NOTE — ED Notes (Signed)
Patient taken to CT.

## 2020-02-03 NOTE — ED Triage Notes (Signed)
Patient went to bathroom and had a syncopal episode.  Patient was tachy at 150 and dizzy.  Patient cbg 165.  Patient is now between 98-120.  No chest pain or nausea or vomiting.  No shortness of breath.  Patient is not vaccinated for Covid but no exposures.  Patient did hit his head during the episode.  No trauma apparent.  Patient was cool, clammy and diaphoretic upon EMS arrival. CAOx4.

## 2020-02-03 NOTE — Consult Note (Addendum)
Cardiology Consultation:    Patient ID: Kevin Merritt MRN: [id number]; DOB: 17-Jul-1950  Admit date: 02/03/2020 Date of Consult: 02/03/2020  Primary Care Provider: [name], MD Primary Cardiologist: No primary care provider on file. - New. Previously Dr. Ellyn Hack in 2015 Primary Electrophysiologist:  None   Patient Profile:   Kevin Merritt is a 69 y.o. male with a hx of HTN, HLD, CKD stage III by labs (1.2-1.4 per [facility]), obesity, prostate CA who is being seen today for the evaluation of atrial fibrillation and syncope at the request of Dr. Doristine Bosworth. He was found to have orthostasis and Covid-19 infection in the ED.  History of Present Illness:   He was previously evaluated by Dr. Ellyn Hack with reassuring cardiac workup. Nuclear stress test in 10/2013 was low risk and 2D echo showed moderate LVH, EF 65-70%, grade 1 DD, trivial AI. He is being treated for prostate CA and previously underwent radiation. More recently he reports he has been on tamsulosin and an injection (med name unknown) every 3 months. Since that time he has had intermittent lightheadedness. This seems to have gotten worse over the last 2 weeks. He also admits his appetite is not robust, but no acute change recently. He got up to go to the bathroom this morning and while getting off the commode felt lightheaded and felt himself go down. He thinks he was out for a brief second. He awoke right away to his wife asking if he is OK. He woke up on his back with his legs up in the air and knees bent. He denies bladder incontinence but he and his wife did notice some stool stains on his underwear once they got him up. EMS was called. EMS run sheet reports initial BP 110/68, was also noted to be tachycardic in afib RVR. The patient otherwise did not notice any recent palpitations, chest pain, SOB, fevers, chills or any other cardiac symptoms.   Upon arrival to the ED, he was found to have positive orthostatics with BP dropping from  130/87 to 84/58 standing. Labs showed positive SARS-CoV-2, Cr 1.69 (previous baseline around 1.3), leukopenia of 3.4, mild anemia Hgb 12.8, lactic acid wnl, BNP 147, d-dimer 2.2, TSH wnl, UDS negative. He denies any sick contacts or other infective symptoms and was unvaccinated. CTA was negative for PE but showed mild bibasilar atelectasis, mild thickening of the peribronchovascular interstitium likely representing trace bilateral perihilar pulmonary edema without infiltrate, severe LVH and PAH possibly result of diastolic dysfunction, 6.3SL ascending aortic aneurysm, right thyroid nodularity, & aortic atherosclerosis. He was given IV fluids, 10mg  IV diltiazem x1 then diltiazem drip thereafter and appears to have converted to NSR shortly before noon without any obvious arrhythmias otherwise. He reports feeling better and is able to sit up without dizziness. He is being given Regen-CoV in the ED.   Past Medical History:  Diagnosis Date   CKD (chronic kidney disease), stage III    High cholesterol    Hypertension    Prostate cancer [facility])     Past Surgical History:  Procedure Laterality Date   COLONOSCOPY W/ POLYPECTOMY  2012   HERNIA REPAIR Right    Age 28   NM MYOVIEW LTD  10/15/2013   7 min, 8.5 METs, 57%, ASx ST depressions - No ischemia / infarction.   THYROIDECTOMY     Age 52   TONSILLECTOMY     Age 89   TRANSTHORACIC ECHOCARDIOGRAM  10/28/2013   Nl LV Size & function; EF 65-70%, mod Conc LVH  with Gd 2 DD     Home Medications:  Prior to Admission medications   Medication Sig Start Date End Date Taking? Authorizing Provider  amLODipine (NORVASC) 10 MG tablet Take 10 mg by mouth daily.   Yes [provider]  aspirin EC 81 MG tablet Take 81 mg by mouth daily.   Yes [provider]  atorvastatin (LIPITOR) 20 MG tablet Take 20 mg by mouth daily. 12/18/19  Yes [provider]  tamsulosin (FLOMAX) 0.4 MG CAPS capsule Take 0.4 mg by mouth daily.  01/30/19   Yes [provider]  valsartan-hydrochlorothiazide (DIOVAN-HCT) 320-25 MG per tablet Take 1 tablet by mouth daily.   Yes [provider]    Inpatient Medications: Scheduled Meds:  aspirin EC  81 mg Oral Daily   atorvastatin  20 mg Oral Daily   heparin  5,500 Units Intravenous Once   sodium chloride flush  3 mL Intravenous Q12H   Continuous Infusions:  sodium chloride 75 mL/hr at 02/03/20 1247   sodium chloride     diltiazem (CARDIZEM) infusion 10 mg/hr (02/03/20 1119)   famotidine (PEPCID) IV     heparin     PRN Meds: sodium chloride, acetaminophen **OR** acetaminophen, albuterol, diphenhydrAMINE, EPINEPHrine, famotidine (PEPCID) IV, methylPREDNISolone (SOLU-MEDROL) injection, ondansetron **OR** ondansetron (ZOFRAN) IV, oxyCODONE  Allergies:    Allergies  Allergen Reactions   Shellfish Allergy Nausea And Vomiting    Social History:   Social History   Socioeconomic History   Marital status: Married    Spouse name: Not on file   Number of children: Not on file   Years of education: Not on file   Highest education level: Not on file  Occupational History   Not on file  Tobacco Use   Smoking status: Never Smoker   Smokeless tobacco: Never Used  Substance and Sexual Activity   Alcohol use: No   Drug use: No   Sexual activity: Not on file  Other Topics Concern   Not on file  Social History Narrative   He is remarried, currently living with his second wife he has total of 6 children; 3 live with them. (5 sons and one daughter) One grandchild. 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 He never smoked and does not drink alcohol.   He is a native of [place].   Social Determinants of Health   Financial Resource Strain:    Difficulty of Paying Living Expenses: Not on file  Food Insecurity:    Worried About Charity fundraiser in the Last Year: Not on file   [employer] of Food in the Last Year:  Not on file  Transportation Needs:    Lack of Transportation (Medical): Not on file   Lack of Transportation (Non-Medical): Not on file  Physical Activity:    Days of Exercise per Week: Not on file   Minutes of Exercise per Session: Not on file  Stress:    Feeling of Stress : Not on file  Social Connections:    Frequency of Communication with Friends and Family: Not on file   Frequency of Social Gatherings with Friends and Family: Not on file   Attends Religious Services: Not on file   Active Member of Clubs or Organizations: Not on file   Attends Archivist Meetings: Not on file   Marital Status: Not on file  Intimate Partner Violence:    Fear of Current or Ex-Partner: Not on file  Emotionally Abused: Not on file   Physically Abused: Not on file   Sexually Abused: Not on file    Family History:    Family History  Problem Relation Age of Onset   Hypertension Mother    Heart failure Mother 22       details unclear per pt   Diabetes Sister 20   Hypertension Sister      ROS:  Please see the history of present illness.  + intermittent hot flashes since tx for prostate CA. All other ROS reviewed and negative.     Physical Exam/Data:   Vitals:   02/03/20 0915 02/03/20 0930 02/03/20 1131 02/03/20 1159  BP: (!) 143/75 135/80 123/86 135/80  Pulse: 80 83 79 79  Resp: 12 19 16 16   Temp:   98 F (36.7 C) 98.3 F (36.8 C)  TempSrc:   Oral Oral  SpO2: 98% 97% 99% 95%    Intake/Output Summary (Last 24 hours) at 02/03/2020 1347 Last data filed at 02/03/2020 1119 Gross per 24 hour  Intake 1057.48 ml  Output --  Net 1057.48 ml   Last 3 Weights 11/18/2013 10/15/2013 10/01/2013  Weight (lbs) 247 lb 9.6 oz 250 lb 250 lb  Weight (kg) 112.311 kg 113.399 kg 113.399 kg     There is no height or weight on file to calculate BMI.   VS reviewed. General - calm M in no acute distress Pulm - No labored breathing, no coughing during visit, no audible  wheezing, speaking in full sentences Neuro - A+Ox3, no slurred speech, answers questions appropriately Psych - Pleasant affect  EKG:  The EKG was personally reviewed and demonstrates:  Atrial fibrillation 109bpm, no acute STT changes, QTc 461ms  Telemetry:  Telemetry was personally reviewed and demonstrates:  Atrial fib RVR, then CVR, then NSR  Relevant CV Studies: 2D echo 10/2013 - Left ventricle: The cavity size was normal. Wall thickness was  increased in a pattern of moderate LVH. Systolic function was  vigorous. The estimated ejection fraction was in the range of 65%  to 70%. Wall motion was normal; there were no regional wall  motion abnormalities. Doppler parameters are consistent with  abnormal left ventricular relaxation (grade 1 diastolic  dysfunction).  - Aortic valve: There was trivial regurgitation.    Nuclear Stress Test 2015 Overall Impression:  Low risk stress nuclear study demonstrating asymptomatic ST depression with normal scintigraphic images and a hypertensive response to exercise. LV Wall Motion:  NL LV Function, EF 57%; NL Wall Motion  Laboratory Data:  Chemistry Recent Labs  Lab 02/03/20 0426 02/03/20 0900  NA 135  --   K 3.6  --   CL 99  --   CO2 23  --   GLUCOSE 125*  --   BUN 20  --   CREATININE 1.69* 1.38*  CALCIUM 8.7*  --   GFRNONAA 40* 51*  GFRAA 46* 59*  ANIONGAP 13  --     No results for input(s): PROT, ALBUMIN, AST, ALT, ALKPHOS, BILITOT in the last 168 hours. Hematology Recent Labs  Lab 02/03/20 0426 02/03/20 0900  WBC 3.9* 3.4*  RBC 4.28 4.21*  HGB 12.9* 12.8*  HCT 38.9* 39.4  MCV 90.9 93.6  MCH 30.1 30.4  MCHC 33.2 32.5  RDW 12.7 12.9  PLT 183 187   Cardiac EnzymesNo results for input(s): TROPONINI in the last 168 hours. No results for input(s): TROPIPOC in the last 168 hours.  BNP Recent Labs  Lab 02/03/20 0900  BNP  147.2*    DDimer  Recent Labs  Lab 02/03/20 0515  DDIMER 2.20*     Radiology/Studies:  CT Head Wo Contrast  Result Date: 02/03/2020 CLINICAL DATA:  Syncope with minor head trauma EXAM: CT HEAD WITHOUT CONTRAST TECHNIQUE: Contiguous axial images were obtained from the base of the skull through the vertex without intravenous contrast. COMPARISON:  None. FINDINGS: Brain: No evidence of acute infarction, hemorrhage, hydrocephalus, extra-axial collection or mass lesion/mass effect. Vascular: No hyperdense vessel or unexpected calcification. Skull: Normal. Negative for fracture or focal lesion. Sinuses/Orbits: Partially covered right maxillary sinus which is essentially completely opacified where seen. There is internal high and low-density components likely from mucosal edema and trapped secretions. IMPRESSION: 1. No evidence of intracranial injury. 2. Essentially complete opacification of the right maxillary sinus that is new from a 2010 neck CT Electronically Signed   By: Monte Fantasia M.D.   On: 02/03/2020 04:59   CT Angio Chest PE W and/or Wo Contrast  Result Date: 02/03/2020 CLINICAL DATA:  Syncope, tachycardia, dizziness, elevated D-dimer EXAM: CT ANGIOGRAPHY CHEST WITH CONTRAST TECHNIQUE: Multidetector CT imaging of the chest was performed using the standard protocol during bolus administration of intravenous contrast. Multiplanar CT image reconstructions and MIPs were obtained to evaluate the vascular anatomy. CONTRAST:  68mL OMNIPAQUE IOHEXOL 350 MG/ML SOLN COMPARISON:  None. FINDINGS: Cardiovascular: There is excellent opacification of the pulmonary arterial tree and there is no intraluminal filling defect identified to suggest acute pulmonary embolism. The central pulmonary arteries are enlarged in keeping with changes of pulmonary arterial hypertension. There is mild coronary artery calcification. There is mild global cardiomegaly with severe left ventricular hypertrophy. There is mild aneurysmal dilation of the a thoracic aorta which measures 4.3 cm in  greatest dimension in its ascending segment. The aorta assumes a more normal caliber in its descending segment measuring 3.2 cm just beyond the takeoff of the left subclavian artery and 2.9 cm at the level of the left atrium. Mediastinum/Nodes: There is nodular enlargement of the right thyroid gland, not well assessed on this examination and new since prior neck CT examination of 04/29/2009. No pathologic thoracic adenopathy. The esophagus is unremarkable. Lungs/Pleura: Mild bibasilar atelectasis. There is mild thickening of the peribronchovascular interstitium likely representing trace bilateral perihilar pulmonary edema. No confluent pulmonary infiltrate. No pneumothorax or pleural effusion. The central airways are widely patent. Upper Abdomen: Unremarkable Musculoskeletal: No acute bone abnormality. Review of the MIP images confirms the above findings. IMPRESSION: No pulmonary embolism. Severe left ventricular hypertrophy. Pulmonary arterial hypertension, possibly the result of diastolic dysfunction,. This could be better assessed with echocardiography, if indicated. Trace perihilar pulmonary edema, likely cardiogenic in nature. 4.3 cm ascending aortic aneurysm. Right thyroid nodularity, poorly assessed on this examination. This could be better assessed with dedicated thyroid sonography once the patient's acute issues have resolved, if clinically indicated. Aortic Atherosclerosis (ICD10-I70.0). Aortic aneurysm NOS (ICD10-I71.9). Electronically Signed   By: Fidela Salisbury MD   On: 02/03/2020 07:02    Assessment and Plan:   1. Syncope, suspect related to orthostasis - positive orthostatics in ED, s/p subsequent fluid resuscitation - would continue to hold antihypertensives and f/u orthostatics tomorrow - patient relays historical dizziness since starting tamsulosin so if orthostasis persists despite drawing back on blood pressure meds, may need to consider alternative - atrial fibrillation in itself does  not typically cause syncope in and of itself, unless associated with structural abnormalities or concomitant arrhythmias or post-termination pauses. He converted spontaneously to NSR without acute change  in rate - await echocardiogram  2. Newly recognized atrial fibrillation with RVR - spontaneously converted to NSR on IV diltazem, repeat EKG pending to have in system - CHADSVASC 3 for HTN, age, vascular disease therefore started on heparin - consider changing to apixaban - await echo - severe LVH, possible PAH on CT - will convert IV diltiazem to oral dosing of 30mg  q6hr (conservative dosing chosen given orthostasis) - give first dose when available then turn off diltiazem drip 1 hour later  3. AKI on CKD stage III - admitting Cr 1.69, improved to 1.38 today closer to patient's previous baseline of 1.2-1.4 by review of primary care [facility] labs - ARB/HCTZ on hold  4. Covid-19 infection - s/p mab infusion in ED, further management per primary team  5. 4.3cm ascending aortic aneurysm - will need f/u in 1 year  6. Right thyroid nodularity - recommend OP thyroid US once acute issues resolve  For questions or updates, please contact [place] Please consult [email or web address] for contact info under    Signed, Charlie Pitter, PA-C  02/03/2020 1:47 PM   History and all data above reviewed.  Patient examined.  I agree with the findings as above.   He has not been feeling well for several days.  He reports dizziness on standing which has been relatively new and progressive.  He had frank syncope with very brief LOC with standing.  He has otherwise felt OK.  The patient denies any new symptoms such as chest discomfort, neck or arm discomfort. There has been no new shortness of breath, PND or orthopnea. There have been no reported palpitations, presyncope or syncope.  He would not have suspected that he had COVID as he has not had any of the usual symptoms.   The patient exam reveals  BP 135/80 (BP  Location: Right Arm)    Pulse 79    Temp 98.3 F (36.8 C) (Oral)    Resp 16    SpO2 95%   GENERAL:  Well appearing NECK:  No jugular venous distention, waveform within normal limits, carotid upstroke brisk and symmetric, no bruits, no thyromegaly LYMPHATICS:  No cervical, inguinal adenopathy LUNGS:  Clear to auscultation bilaterally BACK:  No CVA tenderness CHEST:  Unremarkable HEART:  PMI not displaced or sustained,S1 and S2 within normal limits, no S3, no S4, no clicks, no rubs, no murmurs ABD:  Flat, positive bowel sounds normal in frequency in pitch, no bruits, no rebound, no guarding, no midline pulsatile mass, no hepatomegaly, no splenomegaly EXT:  2 plus pulses throughout, no edema, no cyanosis no clubbing SKIN:  No rashes no nodules NEURO:  Cranial nerves II through XII grossly intact, motor grossly intact throughout PSYCH:  Cognitively intact, oriented to person place and time   Agree with documented assessment and plan.   Atrial fib:  Likely related to dehydration and acute COVID illness.  Agree with changing to PO Cardizem and we can consider whether he will need or tolerate this prior to discharge.  At this point I am not inclined to think that he will need long term anticoagulation as an out patient as this might have been a self limited episode of fib related to his acute illness rather than a primary event.  I agree with IV heparin for now.  EF is hyperdynamic and likely consistent with low intravascular volume.  Of note he has significant LVH so we need to be judicious with volume overload as he would be prone to diastolic  HF.  Check TSH.   SYNCOPE:  He was quite orthostatic related possibly to acute illness.  We will follow his rhythm in the hospital and he will need out patient 4 week monitoring most likely.  We should also repeat his orthostatics during this admission after hydration.    LVH:  By CT.  Echo with severe EF.  This was moderate six years ago and thought to be  related to HTN.  We will likely need to consider out patient PYP scan and other work up.    AORTIC ROOT ENLARGEMENT:  Follow up CT in one year.   PROLONGED QT:  Follow up EKG in AM.      Minus Breeding  2:29 PM  02/03/2020

## 2020-02-03 NOTE — ED Notes (Signed)
Patient transported to CT 

## 2020-02-03 NOTE — ED Notes (Signed)
No changes noted after completion of regen-cov

## 2020-02-03 NOTE — Progress Notes (Signed)
ANTICOAGULATION CONSULT NOTE - Initial Consult  Pharmacy Consult for heparin Indication: atrial fibrillation  Allergies  Allergen Reactions  . Shellfish Allergy Nausea And Vomiting    Patient Measurements:   Heparin Dosing Weight: 115kg  Vital Signs: Temp: 98.4 F (36.9 C) (09/22 0424) Temp Source: Oral (09/22 0424) BP: 143/73 (09/22 0645) Pulse Rate: 94 (09/22 0645)  Labs: Recent Labs    02/03/20 0426  HGB 12.9*  HCT 38.9*  PLT 183  CREATININE 1.69*    CrCl cannot be calculated (Unknown ideal weight.).   Medical History: Past Medical History:  Diagnosis Date  . High cholesterol   . Hypertension   . Prostate cancer [facility])     Assessment: 9 YOM presenting with syncope episode, found in Afib with RVR (new).  Not on anticoagulation PTA.  CHA2DS2-VASc = 2, COVID + with D-dimer 2.20.    Goal of Therapy:  Heparin level 0.3-0.7 units/ml Monitor platelets by anticoagulation protocol: Yes   Plan:  Heparin 5500 units IV x 1, and gtt at 1600 units/hr F/u 8 hour heparin level, long term AC plan and ability to transition to PO  Bertis Ruddy, PharmD Clinical Pharmacist ED Pharmacist Phone # [phone] 02/03/2020 9:44 AM

## 2020-02-04 ENCOUNTER — Inpatient Hospital Stay (HOSPITAL_COMMUNITY): Payer: Medicare HMO

## 2020-02-04 ENCOUNTER — Other Ambulatory Visit: Payer: Self-pay

## 2020-02-04 DIAGNOSIS — R55 Syncope and collapse: Secondary | ICD-10-CM

## 2020-02-04 DIAGNOSIS — U071 COVID-19: Secondary | ICD-10-CM

## 2020-02-04 DIAGNOSIS — I480 Paroxysmal atrial fibrillation: Secondary | ICD-10-CM

## 2020-02-04 LAB — GLUCOSE, CAPILLARY
Glucose-Capillary: 52 mg/dL — ABNORMAL LOW (ref 70–99)
Glucose-Capillary: 115 mg/dL — ABNORMAL HIGH (ref 70–99)
Glucose-Capillary: 144 mg/dL — ABNORMAL HIGH (ref 70–99)
Glucose-Capillary: 99 mg/dL (ref 70–99)
Glucose-Capillary: 90 mg/dL (ref 70–99)
Glucose-Capillary: 99 mg/dL (ref 70–99)

## 2020-02-04 LAB — CBC
HCT: 34.7 % — ABNORMAL LOW (ref 39.0–52.0)
Hemoglobin: 11.4 g/dL — ABNORMAL LOW (ref 13.0–17.0)
MCH: 29.8 pg (ref 26.0–34.0)
MCHC: 32.9 g/dL (ref 30.0–36.0)
MCV: 90.6 fL (ref 80.0–100.0)
Platelets: 158 10*3/uL (ref 150–400)
RBC: 3.83 MIL/uL — ABNORMAL LOW (ref 4.22–5.81)
RDW: 12.9 % (ref 11.5–15.5)
WBC: 3.9 10*3/uL — ABNORMAL LOW (ref 4.0–10.5)
nRBC: 0 % (ref 0.0–0.2)

## 2020-02-04 LAB — COMPREHENSIVE METABOLIC PANEL
ALT: 21 U/L (ref 0–44)
AST: 32 U/L (ref 15–41)
Albumin: 3.1 g/dL — ABNORMAL LOW (ref 3.5–5.0)
Alkaline Phosphatase: 49 U/L (ref 38–126)
Anion gap: 12 (ref 5–15)
BUN: 16 mg/dL (ref 8–23)
CO2: 22 mmol/L (ref 22–32)
Calcium: 8.4 mg/dL — ABNORMAL LOW (ref 8.9–10.3)
Chloride: 103 mmol/L (ref 98–111)
Creatinine, Ser: 1.22 mg/dL (ref 0.61–1.24)
GFR calc Af Amer: >60 mL/min (ref >60)
GFR calc non Af Amer: 59 mL/min — ABNORMAL LOW (ref >60)
Glucose, Bld: 113 mg/dL — ABNORMAL HIGH (ref 70–99)
Potassium: 3.1 mmol/L — ABNORMAL LOW (ref 3.5–5.1)
Sodium: 137 mmol/L (ref 135–145)
Total Bilirubin: 0.9 mg/dL (ref 0.3–1.2)
Total Protein: 6.1 g/dL — ABNORMAL LOW (ref 6.5–8.1)

## 2020-02-04 LAB — HEPARIN LEVEL (UNFRACTIONATED)
Heparin Unfractionated: 0.98 IU/mL — ABNORMAL HIGH (ref 0.30–0.70)
Heparin Unfractionated: 0.87 IU/mL — ABNORMAL HIGH (ref 0.30–0.70)
Heparin Unfractionated: 0.98 IU/mL — ABNORMAL HIGH (ref 0.30–0.70)

## 2020-02-04 LAB — LACTIC ACID, PLASMA: Lactic Acid, Venous: 1.3 mmol/L (ref 0.5–1.9)

## 2020-02-04 LAB — MAGNESIUM: Magnesium: 1.9 mg/dL (ref 1.7–2.4)

## 2020-02-04 MED ORDER — HEPARIN (PORCINE) 25000 UT/250ML-% IV SOLN
1150.0000 [IU]/h | INTRAVENOUS | Status: DC
  Administered 2020-02-04: 1400 [IU]/h via INTRAVENOUS
  Administered 2020-02-05: 1150 [IU]/h via INTRAVENOUS
  Filled 2020-02-04: qty 250

## 2020-02-04 MED ORDER — POTASSIUM CHLORIDE CRYS ER 20 MEQ PO TBCR
40.0000 meq | EXTENDED_RELEASE_TABLET | Freq: Four times a day (QID) | ORAL | Status: AC
  Administered 2020-02-04 (×2): 40 meq via ORAL
  Filled 2020-02-04 (×2): qty 2

## 2020-02-04 MED ORDER — MAGNESIUM SULFATE IN D5W 1-5 GM/100ML-% IV SOLN
1.0000 g | Freq: Once | INTRAVENOUS | Status: AC
  Administered 2020-02-04: 1 g via INTRAVENOUS
  Filled 2020-02-04: qty 100

## 2020-02-04 MED ORDER — DEXTROSE 50 % IV SOLN
25.0000 mL | Freq: Once | INTRAVENOUS | Status: AC
  Administered 2020-02-04: 25 mL via INTRAVENOUS
  Filled 2020-02-04: qty 50

## 2020-02-04 MED ORDER — TAMSULOSIN HCL 0.4 MG PO CAPS
0.4000 mg | ORAL_CAPSULE | Freq: Every day | ORAL | Status: DC
  Administered 2020-02-04 – 2020-02-05 (×2): 0.4 mg via ORAL
  Filled 2020-02-04 (×2): qty 1

## 2020-02-04 NOTE — Progress Notes (Signed)
Carotid duplex has been completed.   Preliminary results in CV Proc.   Abram Sander 02/04/2020 2:25 PM

## 2020-02-04 NOTE — Progress Notes (Signed)
PROGRESS NOTE                                                                                                                                                                                                             Patient Demographics:    Kevin Merritt, is a 69 y.o. male, DOB - Jul 20, 1950, LEX:[id number]  Admit date - 02/03/2020   Admitting Physician Mckinley Jewel, MD  Outpatient Primary MD for the patient is [name], MD  LOS - 1   Chief Complaint  Patient presents with  . Loss of Consciousness       Brief Narrative    HPI: Kevin Merritt is a 69 y.o. male with medical history significant of hypertension, hyperlipidemia, prostate cancer-on IV testosterone injection every 73-month, morbid obesity presents to emergency department for evaluation of syncope.  Patient tells me that he got up this morning to go to bathroom, initially he felt dizzy.  He went to bathroom and when he stood up and suddenly he blacked out and fell on the floor.  He denies head trauma, seizures, headache, blurry vision, nausea, vomiting, chest pain, palpitation or leg swelling.  Reports exertional shortness of breath however denies orthopnea, PND, history of CHF, fever, chills, cough, congestion, bowel changes.  He has chronic urinary symptoms due to history of prostate cancer.  He lives with his wife at home.  No history of smoking, alcohol, listed drug use.  He is not vaccinated against COVID-19 and have not decided to get 1.  No history of recent travel, history of PE/DVT.  ED Course: Upon arrival to ED: Patient tachycardic, tachypneic, CBC shows leukopenia of 3.9, CMP shows AKI on CKD stage III, COVID-19 came back positive.  UA, UDS, ethanol, magnesium level: WNL, D-dimer: 2.20.  CT angio of chest came back negative for PE.  EKG shows A. fib.  Patient started on Cardizem drip and received IV fluid in ED as orthostatic vitals were positive.  [facility]  consulted for admission due to syncope and new onset A. Fib.   Subjective:    Kevin Merritt today reports he is feeling better, reports minimal dyspnea on exertion, reports minimal lightheadedness upon standing but was minimal today and resolved after standing couple minutes.    Assessment  & Plan :    Principal Problem:   Syncope Active Problems:  Essential hypertension   HLD (hyperlipidemia)   New onset atrial fibrillation (HCC)   AKI (acute kidney injury) ([place])    Syncope: -Related to orthostatic hypotension, patient received IV fluids overnight, this morning he is not orthostatic. -No significant events on telemetry. -Follow-up on carotid Dopplers.  Atrial fibrillation -He did convert to normal sinus rhythm, he remains on Cardizem p.o.. -Management per cardiology, this is most likely related to acute illness, no recommendation to start the [facility] or long-term anticoagulation, they will reassess as an outpatient on 4 weeks.  Hypokalemia -Repleted, recheck in a.m., magnesium of 1.9, will aim for> 2, will give 1 g of mag sulfate.  Hypertension -Patient is not orthostatic today, continue with Cardizem for now, hold amlodipine, losartan-HCTZ  COVID-19 infection. -Incidental finding, but no hypoxia, or of pneumonia on imaging. -Deceived monoclonal antibody on. -So far no indication for steroids or remdesivir  COVID-19 Labs  Recent Labs    02/03/20 0515  DDIMER 2.20*    Lab Results  Component Value Date   SARSCOV2NAA POSITIVE (A) 02/03/2020    Severe LVH:  -Due to hypertension, work-up as an outpatient per cardiology .  Ascending aorta aneurysm: Noted on CT chest.  4.3 cm -Follow-up outpatient  Thyroid nodularity:  - Noted on CT chest.   TSH WNL, Follow-up outpatient.  Hyperlipidemia: Continue statin  AKI on CKD stage III: -Creatinine: 1.69, GFR: 46 -Received IV fluids in ED.  Hold nephrotoxic medication valsartan-HCTZ for now. Continue with IV  fluids -Improving  History of prostate cancer:  -He is followed as an outpatient by [facility].  converted spontaneously to NSR without acute change in rate - await echocardiogram   COVID-19 Labs  Recent Labs    02/03/20 0515  DDIMER 2.20*    Lab Results  Component Value Date   SARSCOV2NAA POSITIVE (A) 02/03/2020     Code Status : Full  Family Communication  : D/W wife by phone.  Disposition Plan  :  Status is: Inpatient  Remains inpatient appropriate because:Hemodynamically unstable and IV treatments appropriate due to intensity of illness or inability to take PO   Dispo: The patient is from: Home              Anticipated d/c is to: Home              Anticipated d/c date is: 2 days              Patient currently is not medically stable to d/c.        Barriers For Discharge :   Consults  :  Cardiology  Procedures  : none  DVT Prophylaxis  :  Heparin GTT  Lab Results  Component Value Date   PLT 158 02/04/2020    Antibiotics  :   Anti-infectives (From admission, onward)   None        Objective:   Vitals:   02/04/20 0100 02/04/20 0417 02/04/20 0715 02/04/20 0729  BP:  (!) 145/78  (!) 150/77  Pulse:  84  78  Resp:  17  15  Temp:  98.1 F (36.7 C)  99.1 F (37.3 C)  TempSrc:  Oral  Oral  SpO2:  94% 100%   Weight:  121.1 kg    Height: 6\' 5"  (1.956 m)       Wt Readings from Last 3 Encounters:  02/04/20 121.1 kg  11/18/13 112.3 kg  10/15/13 113.4 kg     Intake/Output Summary (Last 24 hours) at 02/04/2020 1110 Last  data filed at 02/04/2020 0867 Gross per 24 hour  Intake 220.22 ml  Output 1025 ml  Net -804.78 ml     Physical Exam  Awake Alert, Oriented X 3, No new F.N deficits, Normal affect Symmetrical Chest wall movement, Good air movement bilaterally, CTAB RRR,No Gallops,Rubs or new Murmurs, No Parasternal Heave +ve B.Sounds, Abd Soft, No tenderness, No rebound - guarding or rigidity. No Cyanosis, Clubbing or edema,  No new Rash or bruise     Data Review:    CBC Recent Labs  Lab 02/03/20 0426 02/03/20 0900 02/04/20 0607  WBC 3.9* 3.4* 3.9*  HGB 12.9* 12.8* 11.4*  HCT 38.9* 39.4 34.7*  PLT 183 187 158  MCV 90.9 93.6 90.6  MCH 30.1 30.4 29.8  MCHC 33.2 32.5 32.9  RDW 12.7 12.9 12.9  LYMPHSABS 1.4  --   --   MONOABS 0.4  --   --   EOSABS 0.0  --   --   BASOSABS 0.0  --   --     Chemistries  Recent Labs  Lab 02/03/20 0426 02/03/20 0515 02/03/20 0900 02/04/20 0607 02/04/20 0945  NA 135  --   --  137  --   K 3.6  --   --  3.1*  --   CL 99  --   --  103  --   CO2 23  --   --  22  --   GLUCOSE 125*  --   --  113*  --   BUN 20  --   --  16  --   CREATININE 1.69*  --  1.38* 1.22  --   CALCIUM 8.7*  --   --  8.4*  --   MG  --  1.9  --   --  1.9  AST  --   --   --  32  --   ALT  --   --   --  21  --   ALKPHOS  --   --   --  49  --   BILITOT  --   --   --  0.9  --    ------------------------------------------------------------------------------------------------------------------ No results for input(s): CHOL, HDL, LDLCALC, TRIG, CHOLHDL, LDLDIRECT in the last 72 hours.  No results found for: HGBA1C ------------------------------------------------------------------------------------------------------------------ Recent Labs    02/03/20 0900  TSH 1.459   ------------------------------------------------------------------------------------------------------------------ No results for input(s): VITAMINB12, FOLATE, FERRITIN, TIBC, IRON, RETICCTPCT in the last 72 hours.  Coagulation profile No results for input(s): INR, PROTIME in the last 168 hours.  Recent Labs    02/03/20 0515  DDIMER 2.20*    Cardiac Enzymes No results for input(s): CKMB, TROPONINI, MYOGLOBIN in the last 168 hours.  Invalid input(s): CK ------------------------------------------------------------------------------------------------------------------    Component Value Date/Time   BNP 147.2 (H)  02/03/2020 0900    Inpatient Medications  Scheduled Meds: . aspirin EC  81 mg Oral Daily  . atorvastatin  20 mg Oral Daily  . diltiazem  30 mg Oral Q6H  . potassium chloride  40 mEq Oral Q6H  . sodium chloride flush  3 mL Intravenous Q12H   Continuous Infusions: . sodium chloride 75 mL/hr at 02/03/20 2155  . sodium chloride    . famotidine (PEPCID) IV    . heparin 1,400 Units/hr (02/04/20 0503)   PRN Meds:.sodium chloride, acetaminophen **OR** acetaminophen, albuterol, diphenhydrAMINE, EPINEPHrine, famotidine (PEPCID) IV, methylPREDNISolone (SOLU-MEDROL) injection, ondansetron **OR** ondansetron (ZOFRAN) IV, oxyCODONE  Micro Results Recent Results (from the past 240 hour(s))  SARS Coronavirus 2 by RT PCR (hospital order, performed in [facility] hospital lab) Nasopharyngeal Nasopharyngeal Swab     Status: Abnormal   Collection Time: 02/03/20  5:51 AM   Specimen: Nasopharyngeal Swab  Result Value Ref Range Status   SARS Coronavirus 2 POSITIVE (A) NEGATIVE Final    Comment: emailed L. Berdik RN 8:30 02/03/20 ([email or web address]) (NOTE) SARS-CoV-2 target nucleic acids are DETECTED  SARS-CoV-2 RNA is generally detectable in upper respiratory specimens  during the acute phase of infection.  Positive results are indicative  of the presence of the identified virus, but do not rule out bacterial infection or co-infection with other pathogens not detected by the test.  Clinical correlation with patient history and  other diagnostic information is necessary to determine patient infection status.  The expected result is negative.  Fact Sheet for Patients:   StrictlyIdeas.no   Fact Sheet for Healthcare Providers:   BankingDealers.co.za    This test is not yet approved or cleared by the [place] FDA and  has been authorized for detection and/or diagnosis of SARS-CoV-2 by FDA under an Emergency Use Authorization (EUA).  This EUA will remain in  effect (meaning this test can be used) for the duration of  the  COVID-19 declaration under Section 564(b)(1) of the Act, 21 U.S.C. section 360-bbb-3(b)(1), unless the authorization is terminated or revoked sooner.  Performed at [facility], [place] [address], [place],  [address]     Radiology Reports CT Head Wo Contrast  Result Date: 02/03/2020 CLINICAL DATA:  Syncope with minor head trauma EXAM: CT HEAD WITHOUT CONTRAST TECHNIQUE: Contiguous axial images were obtained from the base of the skull through the vertex without intravenous contrast. COMPARISON:  None. FINDINGS: Brain: No evidence of acute infarction, hemorrhage, hydrocephalus, extra-axial collection or mass lesion/mass effect. Vascular: No hyperdense vessel or unexpected calcification. Skull: Normal. Negative for fracture or focal lesion. Sinuses/Orbits: Partially covered right maxillary sinus which is essentially completely opacified where seen. There is internal high and low-density components likely from mucosal edema and trapped secretions. IMPRESSION: 1. No evidence of intracranial injury. 2. Essentially complete opacification of the right maxillary sinus that is new from a 2010 neck CT Electronically Signed   By: Monte Fantasia M.D.   On: 02/03/2020 04:59   CT Angio Chest PE W and/or Wo Contrast  Result Date: 02/03/2020 CLINICAL DATA:  Syncope, tachycardia, dizziness, elevated D-dimer EXAM: CT ANGIOGRAPHY CHEST WITH CONTRAST TECHNIQUE: Multidetector CT imaging of the chest was performed using the standard protocol during bolus administration of intravenous contrast. Multiplanar CT image reconstructions and MIPs were obtained to evaluate the vascular anatomy. CONTRAST:  62mL OMNIPAQUE IOHEXOL 350 MG/ML SOLN COMPARISON:  None. FINDINGS: Cardiovascular: There is excellent opacification of the pulmonary arterial tree and there is no intraluminal filling defect identified to suggest acute pulmonary embolism. The central  pulmonary arteries are enlarged in keeping with changes of pulmonary arterial hypertension. There is mild coronary artery calcification. There is mild global cardiomegaly with severe left ventricular hypertrophy. There is mild aneurysmal dilation of the a thoracic aorta which measures 4.3 cm in greatest dimension in its ascending segment. The aorta assumes a more normal caliber in its descending segment measuring 3.2 cm just beyond the takeoff of the left subclavian artery and 2.9 cm at the level of the left atrium. Mediastinum/Nodes: There is nodular enlargement of the right thyroid gland, not well assessed on this examination and new since prior neck CT examination of 04/29/2009. No pathologic thoracic adenopathy.  The esophagus is unremarkable. Lungs/Pleura: Mild bibasilar atelectasis. There is mild thickening of the peribronchovascular interstitium likely representing trace bilateral perihilar pulmonary edema. No confluent pulmonary infiltrate. No pneumothorax or pleural effusion. The central airways are widely patent. Upper Abdomen: Unremarkable Musculoskeletal: No acute bone abnormality. Review of the MIP images confirms the above findings. IMPRESSION: No pulmonary embolism. Severe left ventricular hypertrophy. Pulmonary arterial hypertension, possibly the result of diastolic dysfunction,. This could be better assessed with echocardiography, if indicated. Trace perihilar pulmonary edema, likely cardiogenic in nature. 4.3 cm ascending aortic aneurysm. Right thyroid nodularity, poorly assessed on this examination. This could be better assessed with dedicated thyroid sonography once the patient's acute issues have resolved, if clinically indicated. Aortic Atherosclerosis (ICD10-I70.0). Aortic aneurysm NOS (ICD10-I71.9). Electronically Signed   By: Fidela Salisbury MD   On: 02/03/2020 07:02   ECHOCARDIOGRAM LIMITED  Result Date: 02/03/2020    ECHOCARDIOGRAM LIMITED REPORT   Patient Name:   KENNITH MORSS Date of  Exam: 02/03/2020 Medical Rec #:  [id number]       Height:       77.0 in Accession #:    [id number]      Weight:       247.6 lb Date of Birth:  07-10-1950       BSA:          2.449 m Patient Age:    24 years        BP:           143/69 mmHg Patient Gender: M               HR:           79 bpm. Exam Location:  Inpatient Procedure: Limited Echo, Cardiac Doppler and Color Doppler Indications:    Elevated Troponin  History:        Patient has prior history of Echocardiogram examinations, most                 recent 10/28/2013. Arrythmias:Atrial Fibrillation; Risk                 Factors:Hypertension, Dyslipidemia and Obesity. COVID+, CKD.  Sonographer:    Dustin Flock Referring Phys: [id number] [place]  1. Left ventricular ejection fraction, by estimation, is 60 to 65%. The left ventricle has normal function. The left ventricle has no regional wall motion abnormalities. There is moderate concentric left ventricular hypertrophy. Left ventricular diastolic parameters are consistent with Grade II diastolic dysfunction (pseudonormalization).  2. Right ventricular systolic function is normal. The right ventricular size is normal.  3. Trivial mitral valve regurgitation.  4. There is mild calcification of the aortic valve. There is mild thickening of the aortic valve. Aortic valve regurgitation is mild. Mild aortic valve sclerosis is present, with no evidence of aortic valve stenosis. Comparison(s): No prior Echocardiogram. FINDINGS  Left Ventricle: Left ventricular ejection fraction, by estimation, is 60 to 65%. The left ventricle has normal function. The left ventricle has no regional wall motion abnormalities. There is moderate concentric left ventricular hypertrophy. Left ventricular diastolic parameters are consistent with Grade II diastolic dysfunction (pseudonormalization). Right Ventricle: The right ventricular size is normal. Right ventricular systolic function is normal. Pericardium: There is no  evidence of pericardial effusion. Mitral Valve: There is mild thickening of the mitral valve leaflet(s). Mild mitral annular calcification. Trivial mitral valve regurgitation. Tricuspid Valve: The tricuspid valve is normal in structure. Tricuspid valve regurgitation is not demonstrated. Aortic Valve: There is mild calcification of the  aortic valve. There is mild thickening of the aortic valve. Aortic valve regurgitation is mild. Aortic regurgitation PHT measures 476 msec. Mild aortic valve sclerosis is present, with no evidence of aortic valve stenosis. Pulmonic Valve: The pulmonic valve was not assessed. Aorta: The aortic root is normal in size and structure. LEFT VENTRICLE PLAX 2D LVIDd:         4.00 cm  Diastology LVIDs:         2.70 cm  LV e' medial:    4.13 cm/s LV PW:         1.70 cm  LV E/e' medial:  10.7 LV IVS:        1.70 cm  LV e' lateral:   6.09 cm/s LVOT diam:     2.30 cm  LV E/e' lateral: 7.2 LVOT Area:     4.15 cm  RIGHT VENTRICLE RV S prime:     5.00 cm/s LEFT ATRIUM         Index LA diam:    3.60 cm 1.47 cm/m  AORTIC VALVE AI PHT:      476 msec  AORTA Ao Root diam: 3.60 cm MITRAL VALVE MV Area (PHT): 4.80 cm    SHUNTS MV Decel Time: 158 msec    Systemic Diam: 2.30 cm MV E velocity: 44.10 cm/s MV A velocity: 43.30 cm/s MV E/A ratio:  1.02 Gwyndolyn Kaufman MD Electronically signed by Gwyndolyn Kaufman MD Signature Date/Time: 02/03/2020/3:29:33 PM    Final       Phillips Climes M.D on 02/04/2020 at 11:10 AM    [facility] -  Office  [phone]

## 2020-02-04 NOTE — Progress Notes (Addendum)
Hypoglycemic Event  CBG: 52   Treatment: D50 25 mL (12.5 gm)  Symptoms: None  Follow-up CBG: Time:0535 CBG Result: 144  Possible Reasons for Event: Other: Pt NPO since midnight per order  Comments/MD notified:On call MD Timothy Opyd notified of event. Orders placed.   Pearletha Alfred

## 2020-02-04 NOTE — Evaluation (Signed)
Occupational Therapy Evaluation Patient Details Name: Kevin Merritt MRN: [id number] DOB: 1950-09-15 Today's Date: 02/04/2020    History of Present Illness 69 y.o male presenting after syncopal episode where he fell in his bathroom. COVID+, unvaccinated. PMH includes hypertension, hyperlipidemia, prostate cancer-on IV testosterone injection every 59-month, morbid obesity   Clinical Impression   PTA pt living with family and functioning at independent community level. At time of eval, pt presents with ability to complete bed mobility at min A and sit <> stands at min A +2 for safety due to feelings of dizziness. Pt had syncopal episodes before admission. Pt is currently completing BADL at min A level due to generalized weakness and pain in coccyx. VSS during session (no orthostasis noted). Pt on RA. Given current status, recommend HHOT to support safety, BADL engagement, and independent PLOF. OT will continue to follow per POC listed below.    Follow Up Recommendations  Home health OT;Supervision - Intermittent    Equipment Recommendations  Tub/shower seat    Recommendations for Other Services       Precautions / Restrictions Precautions Precautions: Fall Precaution Comments: hx of syncopal epsiodes Restrictions Weight Bearing Restrictions: No      Mobility Bed Mobility Overal bed mobility: Needs Assistance Bed Mobility: Sit to Supine       Sit to supine: Min assist   General bed mobility comments: assist in lifting BLEs back onto bed  Transfers Overall transfer level: Needs assistance Equipment used: Rolling walker (2 wheeled) Transfers: Sit to/from [facility] Sit to Stand: Min assist;+2 safety/equipment Stand pivot transfers: Min assist;+2 safety/equipment       General transfer comment: assist to power into standing; +2 used for feelings of light headedness which resolved    Balance Overall balance assessment: Needs assistance Sitting-balance  support: Feet supported Sitting balance-Leahy Scale: Good     Standing balance support: Bilateral upper extremity supported;During functional activity Standing balance-Leahy Scale: Poor Standing balance comment: reliant on external assist                           ADL either performed or assessed with clinical judgement   ADL Overall ADL's : Needs assistance/impaired Eating/Feeding: Set up;Sitting   Grooming: Min guard;Standing Grooming Details (indicate cue type and reason): standing at sink to complete oral care. Reports slight increased dizziness with leaning over Upper Body Bathing: Set up;Sitting   Lower Body Bathing: Minimal assistance;Sit to/from stand;Sitting/lateral leans   Upper Body Dressing : Set up;Sitting   Lower Body Dressing: Minimal assistance;Sit to/from stand;Sitting/lateral leans   Toilet Transfer: Minimal assistance;Ambulation;RW   Toileting- Clothing Manipulation and Hygiene: Set up;Sitting/lateral lean;Sit to/from stand   Tub/ Shower Transfer: Minimal assistance;Ambulation;Shower seat;Rolling walker   Functional mobility during ADLs: Min guard;Rolling walker       Vision Patient Visual Report: No change from baseline       Perception     Praxis      Pertinent Vitals/Pain Pain Assessment: 0-10 Pain Score: 9  Pain Location: tail bone (had fallen on it PTA) Pain Descriptors / Indicators: Aching;Sore Pain Intervention(s): Limited activity within patient's tolerance;Monitored during session;Repositioned;Premedicated before session     Hand Dominance     Extremity/Trunk Assessment Upper Extremity Assessment Upper Extremity Assessment: Overall WFL for tasks assessed   Lower Extremity Assessment Lower Extremity Assessment: Defer to PT evaluation;Generalized weakness       Communication Communication Communication: No difficulties   Cognition Arousal/Alertness: Awake/alert Behavior During Therapy: [facility]  for tasks  assessed/performed Overall Cognitive Status: Within Functional Limits for tasks assessed                                     General Comments       Exercises     Shoulder Instructions      Home Living Family/patient expects to be discharged to:: Private residence Living Arrangements: Spouse/significant other;Children (80 y.o son) Available Help at Discharge: Family Type of Home: House Home Access: Stairs to enter [employer] of Steps: 7 at back; 4 in front- rarely uses front Entrance Stairs-Rails: Right Home Layout: One level     Bathroom Shower/Tub: Occupational psychologist: [place] - [employer]          Prior Functioning/Environment Level of Independence: Independent        Comments: recently retired        OT Problem List: Decreased strength;Decreased knowledge of use of DME or AE;Decreased activity tolerance;Impaired balance (sitting and/or standing)      OT Treatment/Interventions: Self-care/ADL training;Therapeutic exercise;Patient/family education;Balance training;Therapeutic activities;DME and/or AE instruction    OT Goals(Current goals can be found in the care plan section) Acute Rehab OT Goals Patient Stated Goal: reduce dizziness OT Goal Formulation: With patient Time For Goal Achievement: 02/18/20 Potential to Achieve Goals: Good  OT Frequency: Min 2X/week   Barriers to D/C:            Co-evaluation PT/OT/SLP Co-Evaluation/Treatment: Yes Reason for Co-Treatment: For patient/therapist safety (safety for syncope hx)   OT goals addressed during session: ADL's and self-care;Proper use of Adaptive equipment and DME      AM-PAC OT "6 Clicks" Daily Activity     Outcome Measure Help from another person eating meals?: A Little Help from another person taking care of personal grooming?: A Little Help from another person toileting, which includes using toliet, bedpan, or urinal?: A  Little Help from another person bathing (including washing, rinsing, drying)?: A Little Help from another person to put on and taking off regular upper body clothing?: A Little Help from another person to put on and taking off regular lower body clothing?: A Little 6 Click Score: 18   End of Session Equipment Utilized During Treatment: Gait belt;Rolling walker Nurse Communication: Mobility status  Activity Tolerance: Patient tolerated treatment well Patient left: in bed;with call bell/phone within reach  OT Visit Diagnosis: Unsteadiness on feet (R26.81);Other abnormalities of gait and mobility (R26.89);Muscle weakness (generalized) (M62.81)                Time: 2449-7530 OT Time Calculation (min): 31 min Charges:  OT General Charges $OT Visit: 1 Visit OT Evaluation $OT Eval Moderate Complexity: [place], MSOT, OTR/L [place] [facility] Office Number: [phone] Pager: [phone]  Zenovia Jarred 02/04/2020, 1:09 PM

## 2020-02-04 NOTE — Plan of Care (Signed)
Progressing

## 2020-02-04 NOTE — Evaluation (Signed)
Physical Therapy Evaluation Patient Details Name: Encarnacion Scioneaux MRN: [id number] DOB: 03/30/1951 Today's Date: 02/04/2020   History of Present Illness  Pt is 69 y.o male presenting after syncopal episode where he fell in his bathroom. COVID+, unvaccinated. Pt did have orthostatic hypotension in the ED. PMH includes hypertension, hyperlipidemia, prostate cancer-on IV testosterone injection every 21-month, morbid obesity  Clinical Impression  Pt admitted with above diagnosis. Pt presenting with decreased endurance, mobility, balance, safety, and strength today.  He required min A of 2 today for safety due to admitted with syncope and min cues for safety.  Pt had minimal c/o dizziness today and vital signs were stable.  He did express mild dizziness after brushing teeth at sink that resolved.  Pt will benefit from acute PT services to advance.  He does have supervision at home.  Pt currently with functional limitations due to the deficits listed below (see PT Problem List). Pt will benefit from skilled PT to increase their independence and safety with mobility to allow discharge to the venue listed below.       Follow Up Recommendations Home health PT;Supervision - Intermittent    Equipment Recommendations  Rolling walker with 5" wheels    Recommendations for Other Services       Precautions / Restrictions Precautions Precautions: Fall Precaution Comments: hx of syncopal epsiodes Restrictions Weight Bearing Restrictions: No      Mobility  Bed Mobility Overal bed mobility: Needs Assistance Bed Mobility: Sit to Supine       Sit to supine: Min assist   General bed mobility comments: assist in lifting BLEs back onto bed  Transfers Overall transfer level: Needs assistance Equipment used: Rolling walker (2 wheeled) Transfers: Sit to/from [facility] Sit to Stand: Min assist;+2 safety/equipment Stand pivot transfers: Min assist;+2 safety/equipment       General  transfer comment: assist to power into standing; +2 used for feelings of light headedness which resolved  Ambulation/Gait Ambulation/Gait assistance: Min guard;+2 safety/equipment Gait Distance (Feet): 20 Feet Assistive device: Rolling walker (2 wheeled) Gait Pattern/deviations: Step-through pattern;Trunk flexed;Decreased stride length Gait velocity: decreased   General Gait Details: cued for RW and posture; had A of 2 for safety due to syncopal episode prior to admission  Stairs            Wheelchair Mobility    Modified Rankin (Stroke Patients Only)       Balance Overall balance assessment: Needs assistance Sitting-balance support: Feet supported Sitting balance-Leahy Scale: Good     Standing balance support: Bilateral upper extremity supported;During functional activity;No upper extremity supported Standing balance-Leahy Scale: Poor Standing balance comment: Pt used RW for ambulation; he did lift hands to brush teeth at sink but leaned on counter                             Pertinent Vitals/Pain Pain Assessment: Faces Pain Score: 9  Faces Pain Scale: Hurts a little bit Pain Location: tail bone (had fallen on it PTA) Pain Descriptors / Indicators: Aching;Sore Pain Intervention(s): Limited activity within patient's tolerance;Monitored during session;Repositioned;Premedicated before session    [place] expects to be discharged to:: Private residence Living Arrangements: Spouse/significant other;Children (57 yo son) Available Help at Discharge: Family Type of Home: House Home Access: Stairs to enter Entrance Stairs-Rails: Right Entrance Stairs-Number of Steps: 7 at back; 4 in front- rarely uses front Home Layout: One [place] - single point  Prior Function Level of Independence: Independent         Comments: recently retired     Journalist, newspaper        Extremity/Trunk Assessment   Upper Extremity  Assessment Upper Extremity Assessment: Defer to OT evaluation    Lower Extremity Assessment Lower Extremity Assessment: Overall WFL for tasks assessed    Cervical / Trunk Assessment Cervical / Trunk Assessment: Other exceptions Cervical / Trunk Exceptions: forward head  Communication   Communication: No difficulties  Cognition Arousal/Alertness: Awake/alert Behavior During Therapy: WFL for tasks assessed/performed Overall Cognitive Status: Within Functional Limits for tasks assessed                                        General Comments General comments (skin integrity, edema, etc.): O2 sats - unable to get reading but pt with no symptoms; HR stable; BP sitting 137/74, standing 130/115, supine 149/81    Exercises     Assessment/Plan    PT Assessment Patient needs continued PT services  PT Problem List Decreased strength;Decreased mobility;Decreased range of motion;Decreased activity tolerance;Cardiopulmonary status limiting activity;Decreased balance;Decreased knowledge of use of DME       PT Treatment Interventions DME instruction;Therapeutic activities;Gait training;Therapeutic exercise;Patient/family education;Stair training;Balance training;Functional mobility training    PT Goals (Current goals can be found in the Care Plan section)  Acute Rehab PT Goals Patient Stated Goal: reduce dizziness PT Goal Formulation: With patient Time For Goal Achievement: 02/18/20 Potential to Achieve Goals: Good    Frequency Min 3X/week   Barriers to discharge        Co-evaluation   Reason for Co-Treatment: For patient/therapist safety (safety for syncope hx)   OT goals addressed during session: ADL's and self-care;Proper use of Adaptive equipment and DME       AM-PAC PT "6 Clicks" Mobility  Outcome Measure Help needed turning from your back to your side while in a flat bed without using bedrails?: A Little Help needed moving from lying on your back to  sitting on the side of a flat bed without using bedrails?: A Little Help needed moving to and from a bed to a chair (including a wheelchair)?: A Little Help needed standing up from a chair using your arms (e.g., wheelchair or bedside chair)?: A Little Help needed to walk in hospital room?: A Little Help needed climbing 3-5 steps with a railing? : A Little 6 Click Score: 18    End of Session Equipment Utilized During Treatment: Gait belt Activity Tolerance: Patient tolerated treatment well Patient left: in bed;with call bell/phone within reach Nurse Communication: Mobility status PT Visit Diagnosis: Other abnormalities of gait and mobility (R26.89);Muscle weakness (generalized) (M62.81)    Time: 0263-7858 PT Time Calculation (min) (ACUTE ONLY): 31 min   Charges:   PT Evaluation $PT Eval Moderate Complexity: 1 Melina Schools, PT Acute Rehab Services Pager [phone] [facility] [phone]    Karlton Lemon 02/04/2020, 2:27 PM

## 2020-02-04 NOTE — Progress Notes (Addendum)
ANTICOAGULATION CONSULT NOTE -follow up  Pharmacy Consult for heparin Indication: atrial fibrillation  Allergies  Allergen Reactions  . Shellfish Allergy Nausea And Vomiting    Patient Measurements: Height: 6\' 5"  (195.6 cm) Weight: 121.1 kg (267 lb) IBW/kg (Calculated) : 89.1 Heparin Dosing Weight: 111kg  Vital Signs: Temp: 98.6 F (37 C) (09/23 1613) Temp Source: Oral (09/23 1613) BP: 138/65 (09/23 1613) Pulse Rate: 75 (09/23 1613)  Labs: Recent Labs    02/03/20 0426 02/03/20 0426 02/03/20 0900 02/04/20 0020 02/04/20 0607 02/04/20 1550  HGB 12.9*   < > 12.8*  --  11.4*  --   HCT 38.9*  --  39.4  --  34.7*  --   PLT 183  --  187  --  158  --   HEPARINUNFRC  --   --   --  0.98* 0.87* 0.98*  CREATININE 1.69*  --  1.38*  --  1.22  --    < > = values in this interval not displayed.    Estimated Creatinine Clearance: 80 mL/min (by C-G formula based on SCr of 1.22 mg/dL).   Medical History: Past Medical History:  Diagnosis Date  . CKD (chronic kidney disease), stage III   . High cholesterol   . Hypertension   . Prostate cancer [facility])     Assessment: 32 YOM presenting with syncope episode, found in Afib with RVR (new).  Not on anticoagulation PTA.  CHA2DS2-VASc = 2, COVID + with D-dimer 2.20.    First HL 0.98. RN unsure where it was drawn relative to heparin infusion, would expect higher level if contaminated with heparin. No bleeding per RN, no issues with infusion.    At 0400 02/04/20: RN reported patient with small amount of stringy clots from right side of mouth along gum line.  Heparin drip was held x 1 hour this morning then resumed at decreased rate to 1400 units/hr.   This evening the heparin level is 0.98 on heparin 1400 units/hr. No bleeding noted since episode at 4AM , resolved.   RN confirms heparin level drawn correctly from arm opposite of where heparin drip is infusing.     Goal of Therapy:  Heparin level 0.3-0.7 units/ml Monitor platelets by  anticoagulation protocol: Yes   Plan:  Decrease heparin rate to 1150 units/hr F/u 8hr HL  Monitor daily HL, CBC/plt Monitor for signs/symptoms of bleeding  F/u if need PO anticoagulation   Nicole Cella, RPh Clinical Pharmacist  Please check AMION for all [place] phone numbers After 10:00 PM, call [place] 02/04/2020 6:00 PM

## 2020-02-04 NOTE — Progress Notes (Signed)
Pt called this RN to notify that he noticed he had stringy phlem coming from his mouth.   The patient had stringy clots coming from his gum line and right side of his mouth. On call MD, Timothy Opyd called and pharmacist called as well to update.   Per MD and pharmacist recommendation, stop heparin for 1 hour and restart after one hour at previous rate of 14 ml/hr.

## 2020-02-04 NOTE — Progress Notes (Addendum)
ANTICOAGULATION CONSULT NOTE - Initial Consult  Pharmacy Consult for heparin Indication: atrial fibrillation  Allergies  Allergen Reactions  . Shellfish Allergy Nausea And Vomiting    Patient Measurements: Weight: 121.6 kg (268 lb) Heparin Dosing Weight: 111kg  Vital Signs: Temp: 99.6 F (37.6 C) (09/22 2157) Temp Source: Oral (09/22 2157) BP: 157/87 (09/22 2035) Pulse Rate: 84 (09/22 2035)  Labs: Recent Labs    02/03/20 0426 02/03/20 0900 02/04/20 0020  HGB 12.9* 12.8*  --   HCT 38.9* 39.4  --   PLT 183 187  --   HEPARINUNFRC  --   --  0.98*  CREATININE 1.69* 1.38*  --     CrCl cannot be calculated (Unknown ideal weight.).   Medical History: Past Medical History:  Diagnosis Date  . CKD (chronic kidney disease), stage III   . High cholesterol   . Hypertension   . Prostate cancer [facility])     Assessment: 15 YOM presenting with syncope episode, found in Afib with RVR (new).  Not on anticoagulation PTA.  CHA2DS2-VASc = 2, COVID + with D-dimer 2.20.    First HL 0.98. RN unsure where it was drawn relative to heparin infusion, would expect higher level if contaminated with heparin. No bleeding per RN, no issues with infusion.    ADDENDUM 0400: RN called back saying patient with small amount of stringy clots from right side of mouth along gum line. Will hold heparin 1 hr then restart at decreased rate.    Goal of Therapy:  Heparin level 0.3-0.7 units/ml Monitor platelets by anticoagulation protocol: Yes   Plan:  Hold heparin 1 hour then restart at decrease rate 1400 units/hr F/u 6hr HL  Monitor daily HL, CBC/plt Monitor for signs/symptoms of bleeding  F/u if need PO anticoagulation   Benetta Spar, PharmD, BCPS, BCCP Clinical Pharmacist  Please check AMION for all [place] phone numbers After 10:00 PM, call [place]

## 2020-02-04 NOTE — Progress Notes (Signed)
Progress Note  Patient Name: Kevin Merritt Date of Encounter: 02/04/2020  Primary Cardiologist:   No primary care provider on file.   Subjective   The patient had some dizziness today.  No palpitations.  He has developed a cough  Inpatient Medications    Scheduled Meds: . aspirin EC  81 mg Oral Daily  . atorvastatin  20 mg Oral Daily  . diltiazem  30 mg Oral Q6H  . potassium chloride  40 mEq Oral Q6H  . sodium chloride flush  3 mL Intravenous Q12H   Continuous Infusions: . sodium chloride 75 mL/hr at 02/03/20 2155  . sodium chloride    . famotidine (PEPCID) IV    . heparin 1,400 Units/hr (02/04/20 0503)   PRN Meds: sodium chloride, acetaminophen **OR** acetaminophen, albuterol, diphenhydrAMINE, EPINEPHrine, famotidine (PEPCID) IV, methylPREDNISolone (SOLU-MEDROL) injection, ondansetron **OR** ondansetron (ZOFRAN) IV, oxyCODONE   Vital Signs    Vitals:   02/04/20 0100 02/04/20 0417 02/04/20 0715 02/04/20 0729  BP:  (!) 145/78  (!) 150/77  Pulse:  84  78  Resp:  17  15  Temp:  98.1 F (36.7 C)  99.1 F (37.3 C)  TempSrc:  Oral  Oral  SpO2:  94% 100%   Weight:  121.1 kg    Height: 6\' 5"  (1.956 m)       Intake/Output Summary (Last 24 hours) at 02/04/2020 0942 Last data filed at 02/04/2020 8341 Gross per 24 hour  Intake 220.22 ml  Output 1025 ml  Net -804.78 ml   Filed Weights   02/03/20 1732 02/04/20 0417  Weight: 121.6 kg 121.1 kg    Telemetry    NSR - Personally Reviewed  ECG    NA - Personally Reviewed  Physical Exam   GEN: No acute distress.   Neck: No  JVD Cardiac: RRR, no murmurs, rubs, or gallops.  Respiratory:     Decreased breath sounds at the left base. GI: Soft, nontender, non-distended  MS: No  edema; No deformity. Neuro:  Nonfocal  Psych: Normal affect   Labs    Chemistry Recent Labs  Lab 02/03/20 0426 02/03/20 0900 02/04/20 0607  NA 135  --  137  K 3.6  --  3.1*  CL 99  --  103  CO2 23  --  22  GLUCOSE 125*  --   113*  BUN 20  --  16  CREATININE 1.69* 1.38* 1.22  CALCIUM 8.7*  --  8.4*  PROT  --   --  6.1*  ALBUMIN  --   --  3.1*  AST  --   --  32  ALT  --   --  21  ALKPHOS  --   --  49  BILITOT  --   --  0.9  GFRNONAA 40* 51* 59*  GFRAA 46* 59* >60  ANIONGAP 13  --  12     Hematology Recent Labs  Lab 02/03/20 0426 02/03/20 0900 02/04/20 0607  WBC 3.9* 3.4* 3.9*  RBC 4.28 4.21* 3.83*  HGB 12.9* 12.8* 11.4*  HCT 38.9* 39.4 34.7*  MCV 90.9 93.6 90.6  MCH 30.1 30.4 29.8  MCHC 33.2 32.5 32.9  RDW 12.7 12.9 12.9  PLT 183 187 158    Cardiac EnzymesNo results for input(s): TROPONINI in the last 168 hours. No results for input(s): TROPIPOC in the last 168 hours.   BNP Recent Labs  Lab 02/03/20 0900  BNP 147.2*     DDimer  Recent Labs  Lab 02/03/20  5176  DDIMER 2.20*     Radiology    CT Head Wo Contrast  Result Date: 02/03/2020 CLINICAL DATA:  Syncope with minor head trauma EXAM: CT HEAD WITHOUT CONTRAST TECHNIQUE: Contiguous axial images were obtained from the base of the skull through the vertex without intravenous contrast. COMPARISON:  None. FINDINGS: Brain: No evidence of acute infarction, hemorrhage, hydrocephalus, extra-axial collection or mass lesion/mass effect. Vascular: No hyperdense vessel or unexpected calcification. Skull: Normal. Negative for fracture or focal lesion. Sinuses/Orbits: Partially covered right maxillary sinus which is essentially completely opacified where seen. There is internal high and low-density components likely from mucosal edema and trapped secretions. IMPRESSION: 1. No evidence of intracranial injury. 2. Essentially complete opacification of the right maxillary sinus that is new from a 2010 neck CT Electronically Signed   By: Monte Fantasia M.D.   On: 02/03/2020 04:59   CT Angio Chest PE W and/or Wo Contrast  Result Date: 02/03/2020 CLINICAL DATA:  Syncope, tachycardia, dizziness, elevated D-dimer EXAM: CT ANGIOGRAPHY CHEST WITH CONTRAST  TECHNIQUE: Multidetector CT imaging of the chest was performed using the standard protocol during bolus administration of intravenous contrast. Multiplanar CT image reconstructions and MIPs were obtained to evaluate the vascular anatomy. CONTRAST:  61mL OMNIPAQUE IOHEXOL 350 MG/ML SOLN COMPARISON:  None. FINDINGS: Cardiovascular: There is excellent opacification of the pulmonary arterial tree and there is no intraluminal filling defect identified to suggest acute pulmonary embolism. The central pulmonary arteries are enlarged in keeping with changes of pulmonary arterial hypertension. There is mild coronary artery calcification. There is mild global cardiomegaly with severe left ventricular hypertrophy. There is mild aneurysmal dilation of the a thoracic aorta which measures 4.3 cm in greatest dimension in its ascending segment. The aorta assumes a more normal caliber in its descending segment measuring 3.2 cm just beyond the takeoff of the left subclavian artery and 2.9 cm at the level of the left atrium. Mediastinum/Nodes: There is nodular enlargement of the right thyroid gland, not well assessed on this examination and new since prior neck CT examination of 04/29/2009. No pathologic thoracic adenopathy. The esophagus is unremarkable. Lungs/Pleura: Mild bibasilar atelectasis. There is mild thickening of the peribronchovascular interstitium likely representing trace bilateral perihilar pulmonary edema. No confluent pulmonary infiltrate. No pneumothorax or pleural effusion. The central airways are widely patent. Upper Abdomen: Unremarkable Musculoskeletal: No acute bone abnormality. Review of the MIP images confirms the above findings. IMPRESSION: No pulmonary embolism. Severe left ventricular hypertrophy. Pulmonary arterial hypertension, possibly the result of diastolic dysfunction,. This could be better assessed with echocardiography, if indicated. Trace perihilar pulmonary edema, likely cardiogenic in nature. 4.3  cm ascending aortic aneurysm. Right thyroid nodularity, poorly assessed on this examination. This could be better assessed with dedicated thyroid sonography once the patient's acute issues have resolved, if clinically indicated. Aortic Atherosclerosis (ICD10-I70.0). Aortic aneurysm NOS (ICD10-I71.9). Electronically Signed   By: Fidela Salisbury MD   On: 02/03/2020 07:02   ECHOCARDIOGRAM LIMITED  Result Date: 02/03/2020    ECHOCARDIOGRAM LIMITED REPORT   Patient Name:   Kevin Merritt Date of Exam: 02/03/2020 Medical Rec #:  [id number]       Height:       77.0 in Accession #:    [id number]      Weight:       247.6 lb Date of Birth:  [birth year]       BSA:          2.449 m Patient Age:    43 years  BP:           143/69 mmHg Patient Gender: M               HR:           79 bpm. Exam Location:  Inpatient Procedure: Limited Echo, Cardiac Doppler and Color Doppler Indications:    Elevated Troponin  History:        Patient has prior history of Echocardiogram examinations, most                 recent 10/28/2013. Arrythmias:Atrial Fibrillation; Risk                 Factors:Hypertension, Dyslipidemia and Obesity. COVID+, CKD.  Sonographer:    Dustin Flock Referring Phys: [id number] [place]  1. Left ventricular ejection fraction, by estimation, is 60 to 65%. The left ventricle has normal function. The left ventricle has no regional wall motion abnormalities. There is moderate concentric left ventricular hypertrophy. Left ventricular diastolic parameters are consistent with Grade II diastolic dysfunction (pseudonormalization).  2. Right ventricular systolic function is normal. The right ventricular size is normal.  3. Trivial mitral valve regurgitation.  4. There is mild calcification of the aortic valve. There is mild thickening of the aortic valve. Aortic valve regurgitation is mild. Mild aortic valve sclerosis is present, with no evidence of aortic valve stenosis. Comparison(s): No prior  Echocardiogram. FINDINGS  Left Ventricle: Left ventricular ejection fraction, by estimation, is 60 to 65%. The left ventricle has normal function. The left ventricle has no regional wall motion abnormalities. There is moderate concentric left ventricular hypertrophy. Left ventricular diastolic parameters are consistent with Grade II diastolic dysfunction (pseudonormalization). Right Ventricle: The right ventricular size is normal. Right ventricular systolic function is normal. Pericardium: There is no evidence of pericardial effusion. Mitral Valve: There is mild thickening of the mitral valve leaflet(s). Mild mitral annular calcification. Trivial mitral valve regurgitation. Tricuspid Valve: The tricuspid valve is normal in structure. Tricuspid valve regurgitation is not demonstrated. Aortic Valve: There is mild calcification of the aortic valve. There is mild thickening of the aortic valve. Aortic valve regurgitation is mild. Aortic regurgitation PHT measures 476 msec. Mild aortic valve sclerosis is present, with no evidence of aortic valve stenosis. Pulmonic Valve: The pulmonic valve was not assessed. Aorta: The aortic root is normal in size and structure. LEFT VENTRICLE PLAX 2D LVIDd:         4.00 cm  Diastology LVIDs:         2.70 cm  LV e' medial:    4.13 cm/s LV PW:         1.70 cm  LV E/e' medial:  10.7 LV IVS:        1.70 cm  LV e' lateral:   6.09 cm/s LVOT diam:     2.30 cm  LV E/e' lateral: 7.2 LVOT Area:     4.15 cm  RIGHT VENTRICLE RV S prime:     5.00 cm/s LEFT ATRIUM         Index LA diam:    3.60 cm 1.47 cm/m  AORTIC VALVE AI PHT:      476 msec  AORTA Ao Root diam: 3.60 cm MITRAL VALVE MV Area (PHT): 4.80 cm    SHUNTS MV Decel Time: 158 msec    Systemic Diam: 2.30 cm MV E velocity: 44.10 cm/s MV A velocity: 43.30 cm/s MV E/A ratio:  1.02 Gwyndolyn Kaufman MD Electronically signed by Gwyndolyn Kaufman MD Signature  Date/Time: 02/03/2020/3:29:33 PM    Final     Cardiac Studies   ECHO:  1. Left  ventricular ejection fraction, by estimation, is 60 to 65%. The  left ventricle has normal function. The left ventricle has no regional  wall motion abnormalities. There is moderate concentric left ventricular  hypertrophy. Left ventricular  diastolic parameters are consistent with Grade II diastolic dysfunction  (pseudonormalization).  2. Right ventricular systolic function is normal. The right ventricular  size is normal.  3. Trivial mitral valve regurgitation.  4. There is mild calcification of the aortic valve. There is mild  thickening of the aortic valve. Aortic valve regurgitation is mild. Mild  aortic valve sclerosis is present, with no evidence of aortic valve  stenosis.   Patient Profile     69 y.o. male with a hx of HTN, HLD, CKD stage III by labs (1.2-1.4 per [facility]), obesity, prostate CAwho is being seen today for the evaluation ofatrial fibrillation and syncopeat the request of Dr. Doristine Bosworth. He was found to have orthostasis and Covid-19 infection in the ED.  Assessment & Plan    SYNCOPE:    No further events and no arrhythmias.  I suspect that this was orthostatic hypotension and I would like to repeat these.     ATRIAL FIB:  He has been on heparin.  He did self convert to NSR in the ED yesterday.  There is no evidence that this was longer than 48 hours and is likely related to the acute illness.  This coupled, with some flecks of blood in his sputum during this admission, would prompt me not to suggest starting DOAC with this isolated self limited event.  I had this discussion with the patient.  I will arrange an out patient 4 week monitor when he is discharged.     HYPOKALEMIA:  Supplement has been ordered.   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  I will consider further work up as an out patient.  For now control HTN being aware of the orthostasis.    For questions or updates, please contact [place] Please consult [email or web address] for contact info under Cardiology/STEMI.     Signed, Minus Breeding, MD  02/04/2020, 9:42 AM

## 2020-02-04 NOTE — TOC Initial Note (Signed)
Transition of Care [facility]) - Initial/Assessment Note    Patient Details  Name: Kevin Merritt MRN: [id number] Date of Birth: 1950/12/18  Transition [facility] CM/SW Contact:    Verdell Carmine, RN Phone Number: 02/04/2020, 3:28 PM  Clinical Narrative:                 Admitted for atrial fibrillation, COVID positive, currently on room air. PT OT consult revealed recommendation of Home Health PT and OT.  Will need orders written. Attempted to call patient in room to discuss, no answer. Will reach out again at another time to get patient choice of agency and decision on [facility].   Expected Discharge Plan: [place] Barriers to Discharge: Continued Medical Work up   Patient Goals and CMS Choice        Expected Discharge Plan and Services Expected Discharge Plan: [place]       Living arrangements for the past 2 months: Single Family Home                                      Prior Living Arrangements/Services Living arrangements for the past 2 months: Single Family Home Lives with:: Spouse Patient language and need for interpreter reviewed:: Yes        Need for Family Participation in Patient Care: Yes (Comment) Care giver support system in place?: Yes (comment)   Criminal Activity/Legal Involvement Pertinent to Current Situation/Hospitalization: No - Comment as needed  Activities of Daily Living      Permission Sought/Granted                  Emotional Assessment       Orientation: : Oriented to Self, Oriented to Place, Oriented to  Time, Oriented to Situation Alcohol / Substance Use: Not Applicable Psych Involvement: No (comment)  Admission diagnosis:  Syncope [R55] Orthostasis [I95.1] AKI (acute kidney injury) ([place]) [N17.9] Atrial fibrillation with RVR ([place]) [I48.91] Syncope, unspecified syncope type [R55] Patient Active Problem List   Diagnosis Date Noted  . Syncope 02/03/2020  . New onset atrial  fibrillation ([place]) 02/03/2020  . AKI (acute kidney injury) ([place]) 02/03/2020  . Prostate cancer ([place]) 03/10/2019  . DOE (dyspnea on exertion) 10/04/2013  . Essential hypertension 10/04/2013  . Chest pain with low risk for cardiac etiology 10/04/2013  . HLD (hyperlipidemia)    PCP:  [name], MD Pharmacy:   [facility] [address] [place] [address] Phone: [phone] Fax: [phone]  [facility] [place], [place] [address] [place] [address] Phone: [phone] Fax: [phone]     Social Determinants of Health (SDOH) Interventions    Readmission Risk Interventions No flowsheet data found.

## 2020-02-05 ENCOUNTER — Other Ambulatory Visit: Payer: Self-pay | Admitting: Student

## 2020-02-05 DIAGNOSIS — I4891 Unspecified atrial fibrillation: Secondary | ICD-10-CM

## 2020-02-05 LAB — COMPREHENSIVE METABOLIC PANEL
ALT: 19 U/L (ref 0–44)
AST: 28 U/L (ref 15–41)
Albumin: 3.1 g/dL — ABNORMAL LOW (ref 3.5–5.0)
Alkaline Phosphatase: 48 U/L (ref 38–126)
Anion gap: 9 (ref 5–15)
BUN: 9 mg/dL (ref 8–23)
CO2: 22 mmol/L (ref 22–32)
Calcium: 8.4 mg/dL — ABNORMAL LOW (ref 8.9–10.3)
Chloride: 103 mmol/L (ref 98–111)
Creatinine, Ser: 1.06 mg/dL (ref 0.61–1.24)
GFR calc Af Amer: >60 mL/min (ref >60)
GFR calc non Af Amer: >60 mL/min (ref >60)
Glucose, Bld: 105 mg/dL — ABNORMAL HIGH (ref 70–99)
Potassium: 3.6 mmol/L (ref 3.5–5.1)
Sodium: 134 mmol/L — ABNORMAL LOW (ref 135–145)
Total Bilirubin: 0.5 mg/dL (ref 0.3–1.2)
Total Protein: 6.2 g/dL — ABNORMAL LOW (ref 6.5–8.1)

## 2020-02-05 LAB — C-REACTIVE PROTEIN: CRP: 1.8 mg/dL — ABNORMAL HIGH (ref <1.0)

## 2020-02-05 LAB — CBC
HCT: 32.9 % — ABNORMAL LOW (ref 39.0–52.0)
Hemoglobin: 10.8 g/dL — ABNORMAL LOW (ref 13.0–17.0)
MCH: 29.7 pg (ref 26.0–34.0)
MCHC: 32.8 g/dL (ref 30.0–36.0)
MCV: 90.4 fL (ref 80.0–100.0)
Platelets: 150 10*3/uL (ref 150–400)
RBC: 3.64 MIL/uL — ABNORMAL LOW (ref 4.22–5.81)
RDW: 12.8 % (ref 11.5–15.5)
WBC: 3.9 10*3/uL — ABNORMAL LOW (ref 4.0–10.5)
nRBC: 0 % (ref 0.0–0.2)

## 2020-02-05 LAB — GLUCOSE, CAPILLARY
Glucose-Capillary: 92 mg/dL (ref 70–99)
Glucose-Capillary: 95 mg/dL (ref 70–99)

## 2020-02-05 LAB — CORTISOL: Cortisol, Plasma: 6.6 ug/dL

## 2020-02-05 LAB — HEPARIN LEVEL (UNFRACTIONATED): Heparin Unfractionated: 0.97 IU/mL — ABNORMAL HIGH (ref 0.30–0.70)

## 2020-02-05 LAB — D-DIMER, QUANTITATIVE: D-Dimer, Quant: 0.57 ug/mL-FEU — ABNORMAL HIGH (ref 0.00–0.50)

## 2020-02-05 MED ORDER — ASCORBIC ACID 500 MG PO TABS
500.0000 mg | ORAL_TABLET | Freq: Every day | ORAL | Status: DC
  Administered 2020-02-05 – 2020-02-07 (×3): 500 mg via ORAL
  Filled 2020-02-05 (×3): qty 1

## 2020-02-05 MED ORDER — DILTIAZEM HCL ER COATED BEADS 240 MG PO CP24
240.0000 mg | ORAL_CAPSULE | Freq: Every day | ORAL | Status: DC
  Administered 2020-02-05 – 2020-02-07 (×3): 240 mg via ORAL
  Filled 2020-02-05 (×3): qty 1

## 2020-02-05 MED ORDER — TAMSULOSIN HCL 0.4 MG PO CAPS
0.4000 mg | ORAL_CAPSULE | Freq: Every day | ORAL | Status: DC
  Administered 2020-02-06: 0.4 mg via ORAL
  Filled 2020-02-05: qty 1

## 2020-02-05 NOTE — Progress Notes (Signed)
Ordered 30-Day Event Monitor for further evaluation of atrial fibrillation. Please see Dr. Rosezella Florida rounding note from today for more details.  Darreld Mclean, PA-C 02/05/2020 10:07 AM

## 2020-02-05 NOTE — Progress Notes (Signed)
PROGRESS NOTE                                                                                                                                                                                                             Patient Demographics:    Kevin Merritt, is a 69 y.o. male, DOB - Feb 09, 1951, OJJ:[id number]  Admit date - 02/03/2020   Admitting Physician Mckinley Jewel, MD  Outpatient Primary MD for the patient is Kevin Carol, MD  LOS - 2   Chief Complaint  Patient presents with  . Loss of Consciousness       Brief Narrative    69 y.o. male with medical history significant of hypertension, hyperlipidemia, prostate cancer-on IV testosterone injection every 31-month, morbid obesity presents to emergency department for evaluation of syncope. -He tested positive for COVID-19, noted to be in A. fib, cardiology were consulted.   Subjective:    [name] today reports he is feeling better, dizziness and lightheadedness has significantly improved, he denies any chest pain, shortness of breath, but reports some cough.   Assessment  & Plan :    Principal Problem:   Syncope Active Problems:   Essential hypertension   HLD (hyperlipidemia)   New onset atrial fibrillation (HCC)   AKI (acute kidney injury) ([place])   Syncope: -Related to orthostatic hypotension, this has improved with IV fluids, he is mildly orthostatic today, and he reports mild dizziness upon standing . -Significant events on telemetry . -No significant stenosis on carotid Dopplers . -We will check cortisol level, will get him TED hose as well . -We will change Flomax to evening .  Atrial fibrillation -He did convert to normal sinus rhythm, he remains on Cardizem p.o. been consolidated to CD form -Management per cardiology, this is most likely related to acute illness, no recommendation for long-term anticoagulation, they will arrange for Holter monitor as an  outpatient, will stop heparin GTT, continue with aspirin.  Hypokalemia -Repleted, recheck BMP for today  Hypertension -Blood pressure slightly elevated, but he remains mildly orthostatic, continue with Cardizem CD, but continue to hold amlodipine, losartan-HCTZ  COVID-19 infection. -Incidental finding, but no hypoxia, or of pneumonia on imaging. -Deceived monoclonal antibody on. -So far no indication for steroids or remdesivir  COVID-19 Labs  Recent Labs    02/03/20 0515  DDIMER 2.20*  Lab Results  Component Value Date   SARSCOV2NAA POSITIVE (A) 02/03/2020    Severe LVH:  -Due to hypertension, work-up as an outpatient per cardiology .  Ascending aorta aneurysm: Noted on CT chest.  4.3 cm -Follow-up outpatient  Thyroid nodularity:  - Noted on CT chest.   TSH WNL, Follow-up outpatient.  Hyperlipidemia: Continue statin  AKI on CKD stage III: -Creatinine: 1.69, GFR: 46 -Received IV fluids in ED.  Hold nephrotoxic medication valsartan-HCTZ for now. Continue with IV fluids -Improving  History of prostate cancer:  -He is followed as an outpatient by Dr. Gloriann Loan [facility]rology. -Patient with BPH, he was started recently on Flomax, dose was moved to evening time given orthostasis.   COVID-19 Labs  Recent Labs    02/03/20 0515  DDIMER 2.20*    Lab Results  Component Value Date   SARSCOV2NAA POSITIVE (A) 02/03/2020     Code Status : Full  Family Communication  : D/W wife by phone.  Disposition Plan  :  Status is: Inpatient  Remains inpatient appropriate because:Hemodynamically unstable and IV treatments appropriate due to intensity of illness or inability to take PO   Dispo: The patient is from: Home              Anticipated d/c is to: Home              Anticipated d/c date is: 1 day              Patient currently is not medically stable to d/c.        Barriers For Discharge :   Consults  :  Cardiology  Procedures  : none  DVT  Prophylaxis  :  Heparin GTT  Lab Results  Component Value Date   PLT 158 02/04/2020    Antibiotics  :   Anti-infectives (From admission, onward)   None        Objective:   Vitals:   02/04/20 2041 02/05/20 0510 02/05/20 0527 02/05/20 0715  BP:  (!) 152/77    Pulse: 80 87    Resp:  17    Temp: 98.6 F (37 C) 99.1 F (37.3 C)    TempSrc: Oral Oral    SpO2:  98%  98%  Weight:   124.8 kg   Height:        Wt Readings from Last 3 Encounters:  02/05/20 124.8 kg  11/18/13 112.3 kg  10/15/13 113.4 kg     Intake/Output Summary (Last 24 hours) at 02/05/2020 1155 Last data filed at 02/05/2020 0741 Gross per 24 hour  Intake 3581.94 ml  Output 1700 ml  Net 1881.94 ml     Physical Exam  Awake Alert, Oriented X 3, No new F.N deficits, Normal affect Symmetrical Chest wall movement, Good air movement bilaterally, CTAB RRR,No Gallops,Rubs or new Murmurs, No Parasternal Heave +ve B.Sounds, Abd Soft, No tenderness, No rebound - guarding or rigidity. No Cyanosis, Clubbing or edema, No new Rash or bruise        Data Review:    CBC Recent Labs  Lab 02/03/20 0426 02/03/20 0900 02/04/20 0607  WBC 3.9* 3.4* 3.9*  HGB 12.9* 12.8* 11.4*  HCT 38.9* 39.4 34.7*  PLT 183 187 158  MCV 90.9 93.6 90.6  MCH 30.1 30.4 29.8  MCHC 33.2 32.5 32.9  RDW 12.7 12.9 12.9  LYMPHSABS 1.4  --   --   MONOABS 0.4  --   --   EOSABS 0.0  --   --  BASOSABS 0.0  --   --     Chemistries  Recent Labs  Lab 02/03/20 0426 02/03/20 0515 02/03/20 0900 02/04/20 0607 02/04/20 0945  NA 135  --   --  137  --   K 3.6  --   --  3.1*  --   CL 99  --   --  103  --   CO2 23  --   --  22  --   GLUCOSE 125*  --   --  113*  --   BUN 20  --   --  16  --   CREATININE 1.69*  --  1.38* 1.22  --   CALCIUM 8.7*  --   --  8.4*  --   MG  --  1.9  --   --  1.9  AST  --   --   --  32  --   ALT  --   --   --  21  --   ALKPHOS  --   --   --  49  --   BILITOT  --   --   --  0.9  --     ------------------------------------------------------------------------------------------------------------------ No results for input(s): CHOL, HDL, LDLCALC, TRIG, CHOLHDL, LDLDIRECT in the last 72 hours.  No results found for: HGBA1C ------------------------------------------------------------------------------------------------------------------ Recent Labs    02/03/20 0900  TSH 1.459   ------------------------------------------------------------------------------------------------------------------ No results for input(s): VITAMINB12, FOLATE, FERRITIN, TIBC, IRON, RETICCTPCT in the last 72 hours.  Coagulation profile No results for input(s): INR, PROTIME in the last 168 hours.  Recent Labs    02/03/20 0515  DDIMER 2.20*    Cardiac Enzymes No results for input(s): CKMB, TROPONINI, MYOGLOBIN in the last 168 hours.  Invalid input(s): CK ------------------------------------------------------------------------------------------------------------------    Component Value Date/Time   BNP 147.2 (H) 02/03/2020 0900    Inpatient Medications  Scheduled Meds: . aspirin EC  81 mg Oral Daily  . atorvastatin  20 mg Oral Daily  . diltiazem  240 mg Oral Daily  . sodium chloride flush  3 mL Intravenous Q12H  . [START ON 02/06/2020] tamsulosin  0.4 mg Oral QPC supper   Continuous Infusions: . sodium chloride    . famotidine (PEPCID) IV    . heparin 1,150 Units/hr (02/05/20 0228)   PRN Meds:.sodium chloride, acetaminophen **OR** acetaminophen, diphenhydrAMINE, EPINEPHrine, famotidine (PEPCID) IV, methylPREDNISolone (SOLU-MEDROL) injection, ondansetron **OR** ondansetron (ZOFRAN) IV, oxyCODONE  Micro Results Recent Results (from the past 240 hour(s))  SARS Coronavirus 2 by RT PCR (hospital order, performed in [facility] hospital lab) Nasopharyngeal Nasopharyngeal Swab     Status: Abnormal   Collection Time: 02/03/20  5:51 AM   Specimen: Nasopharyngeal Swab  Result Value Ref  Range Status   SARS Coronavirus 2 POSITIVE (A) NEGATIVE Final    Comment: emailed L. Berdik RN 8:30 02/03/20 ([email or web address]) (NOTE) SARS-CoV-2 target nucleic acids are DETECTED  SARS-CoV-2 RNA is generally detectable in upper respiratory specimens  during the acute phase of infection.  Positive results are indicative  of the presence of the identified virus, but do not rule out bacterial infection or co-infection with other pathogens not detected by the test.  Clinical correlation with patient history and  other diagnostic information is necessary to determine patient infection status.  The expected result is negative.  Fact Sheet for Patients:   StrictlyIdeas.no   Fact Sheet for Healthcare Providers:   BankingDealers.co.za    This test is not yet approved or cleared by the [place]  FDA and  has been authorized for detection and/or diagnosis of SARS-CoV-2 by FDA under an Emergency Use Authorization (EUA).  This EUA will remain in effect (meaning this test can be used) for the duration of  the  COVID-19 declaration under Section 564(b)(1) of the Act, 21 U.S.C. section 360-bbb-3(b)(1), unless the authorization is terminated or revoked sooner.  Performed at [facility], [place] [address] [place], [place] [address]     Radiology Reports CT Head Wo Contrast  Result Date: 02/03/2020 CLINICAL DATA:  Syncope with minor head trauma EXAM: CT HEAD WITHOUT CONTRAST TECHNIQUE: Contiguous axial images were obtained from the base of the skull through the vertex without intravenous contrast. COMPARISON:  None. FINDINGS: Brain: No evidence of acute infarction, hemorrhage, hydrocephalus, extra-axial collection or mass lesion/mass effect. Vascular: No hyperdense vessel or unexpected calcification. Skull: Normal. Negative for fracture or focal lesion. Sinuses/Orbits: Partially covered right maxillary sinus which is essentially completely opacified  where seen. There is internal high and low-density components likely from mucosal edema and trapped secretions. IMPRESSION: 1. No evidence of intracranial injury. 2. Essentially complete opacification of the right maxillary sinus that is new from a 2010 neck CT Electronically Signed   By: Monte Fantasia M.D.   On: 02/03/2020 04:59   CT Angio Chest PE W and/or Wo Contrast  Result Date: 02/03/2020 CLINICAL DATA:  Syncope, tachycardia, dizziness, elevated D-dimer EXAM: CT ANGIOGRAPHY CHEST WITH CONTRAST TECHNIQUE: Multidetector CT imaging of the chest was performed using the standard protocol during bolus administration of intravenous contrast. Multiplanar CT image reconstructions and MIPs were obtained to evaluate the vascular anatomy. CONTRAST:  28mL OMNIPAQUE IOHEXOL 350 MG/ML SOLN COMPARISON:  None. FINDINGS: Cardiovascular: There is excellent opacification of the pulmonary arterial tree and there is no intraluminal filling defect identified to suggest acute pulmonary embolism. The central pulmonary arteries are enlarged in keeping with changes of pulmonary arterial hypertension. There is mild coronary artery calcification. There is mild global cardiomegaly with severe left ventricular hypertrophy. There is mild aneurysmal dilation of the a thoracic aorta which measures 4.3 cm in greatest dimension in its ascending segment. The aorta assumes a more normal caliber in its descending segment measuring 3.2 cm just beyond the takeoff of the left subclavian artery and 2.9 cm at the level of the left atrium. Mediastinum/Nodes: There is nodular enlargement of the right thyroid gland, not well assessed on this examination and new since prior neck CT examination of 04/29/2009. No pathologic thoracic adenopathy. The esophagus is unremarkable. Lungs/Pleura: Mild bibasilar atelectasis. There is mild thickening of the peribronchovascular interstitium likely representing trace bilateral perihilar pulmonary edema. No  confluent pulmonary infiltrate. No pneumothorax or pleural effusion. The central airways are widely patent. Upper Abdomen: Unremarkable Musculoskeletal: No acute bone abnormality. Review of the MIP images confirms the above findings. IMPRESSION: No pulmonary embolism. Severe left ventricular hypertrophy. Pulmonary arterial hypertension, possibly the result of diastolic dysfunction,. This could be better assessed with echocardiography, if indicated. Trace perihilar pulmonary edema, likely cardiogenic in nature. 4.3 cm ascending aortic aneurysm. Right thyroid nodularity, poorly assessed on this examination. This could be better assessed with dedicated thyroid sonography once the patient's acute issues have resolved, if clinically indicated. Aortic Atherosclerosis (ICD10-I70.0). Aortic aneurysm NOS (ICD10-I71.9). Electronically Signed   By: Fidela Salisbury MD   On: 02/03/2020 07:02   VAS US CAROTID  Result Date: 02/04/2020 Carotid Arterial Duplex Study Indications:       Syncope. Risk Factors:      Hypertension, hyperlipidemia. Comparison  Study:  no prior Performing Technologist: Abram Sander RVS  Examination Guidelines: A complete evaluation includes B-mode imaging, spectral Doppler, color Doppler, and power Doppler as needed of all accessible portions of each vessel. Bilateral testing is considered an integral part of a complete examination. Limited examinations for reoccurring indications may be performed as noted.  Right Carotid Findings: +----------+--------+--------+--------+------------------+--------+           PSV cm/sEDV cm/sStenosisPlaque DescriptionComments +----------+--------+--------+--------+------------------+--------+ CCA Prox  102     7               heterogenous               +----------+--------+--------+--------+------------------+--------+ CCA Distal87      14              heterogenous               +----------+--------+--------+--------+------------------+--------+ ICA  Prox  80      22      1-39%   heterogenous               +----------+--------+--------+--------+------------------+--------+ ICA Distal115     32                                         +----------+--------+--------+--------+------------------+--------+ ECA       75                                                 +----------+--------+--------+--------+------------------+--------+ +----------+--------+-------+--------+-------------------+           PSV cm/sEDV cmsDescribeArm Pressure (mmHG) +----------+--------+-------+--------+-------------------+ [id number]                                        +----------+--------+-------+--------+-------------------+ +---------+--------+--+--------+--+---------+ VertebralPSV cm/s58EDV cm/s12Antegrade +---------+--------+--+--------+--+---------+  Left Carotid Findings: +----------+--------+--------+--------+------------------+--------+           PSV cm/sEDV cm/sStenosisPlaque DescriptionComments +----------+--------+--------+--------+------------------+--------+ CCA Prox  142     16              heterogenous               +----------+--------+--------+--------+------------------+--------+ CCA Distal104     17              heterogenous               +----------+--------+--------+--------+------------------+--------+ ICA Prox  65      18      1-39%   heterogenous               +----------+--------+--------+--------+------------------+--------+ ICA Distal84      22                                         +----------+--------+--------+--------+------------------+--------+ ECA       105                                                +----------+--------+--------+--------+------------------+--------+ +----------+--------+--------+--------+-------------------+           PSV cm/sEDV  cm/sDescribeArm Pressure (mmHG) +----------+--------+--------+--------+-------------------+ Subclavian127                                          +----------+--------+--------+--------+-------------------+ +---------+--------+--+--------+--+---------+ VertebralPSV cm/s51EDV cm/s14Antegrade +---------+--------+--+--------+--+---------+   Summary: Right Carotid: Velocities in the right ICA are consistent with a 1-39% stenosis. Left Carotid: Velocities in the left ICA are consistent with a 1-39% stenosis. Vertebrals: Bilateral vertebral arteries demonstrate antegrade flow. *See table(s) above for measurements and observations.  Electronically signed by Servando Snare MD on 02/04/2020 at 7:10:03 PM.    Final    ECHOCARDIOGRAM LIMITED  Result Date: 02/03/2020    ECHOCARDIOGRAM LIMITED REPORT   Patient Name:   DARCELL SABINO Date of Exam: 02/03/2020 Medical Rec #:  [id number]       Height:       77.0 in Accession #:    [id number]      Weight:       247.6 lb Date of Birth:  03-06-1951       BSA:          2.449 m Patient Age:    35 years        BP:           143/69 mmHg Patient Gender: M               HR:           79 bpm. Exam Location:  Inpatient Procedure: Limited Echo, Cardiac Doppler and Color Doppler Indications:    Elevated Troponin  History:        Patient has prior history of Echocardiogram examinations, most                 recent 10/28/2013. Arrythmias:Atrial Fibrillation; Risk                 Factors:Hypertension, Dyslipidemia and Obesity. COVID+, CKD.  Sonographer:    Dustin Flock Referring Phys: [id number] [place]  1. Left ventricular ejection fraction, by estimation, is 60 to 65%. The left ventricle has normal function. The left ventricle has no regional wall motion abnormalities. There is moderate concentric left ventricular hypertrophy. Left ventricular diastolic parameters are consistent with Grade II diastolic dysfunction (pseudonormalization).  2. Right ventricular systolic function is normal. The right ventricular size is normal.  3. Trivial mitral valve regurgitation.  4. There is  mild calcification of the aortic valve. There is mild thickening of the aortic valve. Aortic valve regurgitation is mild. Mild aortic valve sclerosis is present, with no evidence of aortic valve stenosis. Comparison(s): No prior Echocardiogram. FINDINGS  Left Ventricle: Left ventricular ejection fraction, by estimation, is 60 to 65%. The left ventricle has normal function. The left ventricle has no regional wall motion abnormalities. There is moderate concentric left ventricular hypertrophy. Left ventricular diastolic parameters are consistent with Grade II diastolic dysfunction (pseudonormalization). Right Ventricle: The right ventricular size is normal. Right ventricular systolic function is normal. Pericardium: There is no evidence of pericardial effusion. Mitral Valve: There is mild thickening of the mitral valve leaflet(s). Mild mitral annular calcification. Trivial mitral valve regurgitation. Tricuspid Valve: The tricuspid valve is normal in structure. Tricuspid valve regurgitation is not demonstrated. Aortic Valve: There is mild calcification of the aortic valve. There is mild thickening of the aortic valve. Aortic valve regurgitation is mild. Aortic regurgitation PHT measures 476 msec. Mild aortic valve sclerosis is present, with no evidence of  aortic valve stenosis. Pulmonic Valve: The pulmonic valve was not assessed. Aorta: The aortic root is normal in size and structure. LEFT VENTRICLE PLAX 2D LVIDd:         4.00 cm  Diastology LVIDs:         2.70 cm  LV e' medial:    4.13 cm/s LV PW:         1.70 cm  LV E/e' medial:  10.7 LV IVS:        1.70 cm  LV e' lateral:   6.09 cm/s LVOT diam:     2.30 cm  LV E/e' lateral: 7.2 LVOT Area:     4.15 cm  RIGHT VENTRICLE RV S prime:     5.00 cm/s LEFT ATRIUM         Index LA diam:    3.60 cm 1.47 cm/m  AORTIC VALVE AI PHT:      476 msec  AORTA Ao Root diam: 3.60 cm MITRAL VALVE MV Area (PHT): 4.80 cm    SHUNTS MV Decel Time: 158 msec    Systemic Diam: 2.30 cm MV E  velocity: 44.10 cm/s MV A velocity: 43.30 cm/s MV E/A ratio:  1.02 Gwyndolyn Kaufman MD Electronically signed by Gwyndolyn Kaufman MD Signature Date/Time: 02/03/2020/3:29:33 PM    Final       Phillips Climes M.D on 02/05/2020 at 11:55 AM    [facility] -  Office  [phone]

## 2020-02-05 NOTE — Progress Notes (Signed)
Progress Note  Patient Name: Kevin Merritt Date of Encounter: 02/05/2020  Primary Cardiologist:   No primary care provider on file.   Subjective   Cough is improved.  No acute SOB.   Inpatient Medications    Scheduled Meds: . aspirin EC  81 mg Oral Daily  . atorvastatin  20 mg Oral Daily  . diltiazem  30 mg Oral Q6H  . sodium chloride flush  3 mL Intravenous Q12H  . tamsulosin  0.4 mg Oral Daily   Continuous Infusions: . sodium chloride    . famotidine (PEPCID) IV    . heparin 1,150 Units/hr (02/05/20 0228)   PRN Meds: sodium chloride, acetaminophen **OR** acetaminophen, diphenhydrAMINE, EPINEPHrine, famotidine (PEPCID) IV, methylPREDNISolone (SOLU-MEDROL) injection, ondansetron **OR** ondansetron (ZOFRAN) IV, oxyCODONE   Vital Signs    Vitals:   02/04/20 2041 02/05/20 0510 02/05/20 0527 02/05/20 0715  BP:  (!) 152/77    Pulse: 80 87    Resp:  17    Temp: 98.6 F (37 C) 99.1 F (37.3 C)    TempSrc: Oral Oral    SpO2:  98%  98%  Weight:   124.8 kg   Height:        Intake/Output Summary (Last 24 hours) at 02/05/2020 0903 Last data filed at 02/05/2020 0741 Gross per 24 hour  Intake 3581.94 ml  Output 1700 ml  Net 1881.94 ml   Filed Weights   02/03/20 1732 02/04/20 0417 02/05/20 0527  Weight: 121.6 kg 121.1 kg 124.8 kg    Telemetry    RRR - Personally Reviewed  ECG    NA - Personally Reviewed  Physical Exam   GEN: No  acute distress.   Neck: No  JVD Cardiac: RRR, no murmurs, rubs, or gallops.  Respiratory: Clear   to auscultation bilaterally. GI: Soft, nontender, non-distended, normal bowel sounds  MS:  No edema; No deformity. Neuro:   Nonfocal  Psych: Oriented and appropriate     Labs    Chemistry Recent Labs  Lab 02/03/20 0426 02/03/20 0900 02/04/20 0607  NA 135  --  137  K 3.6  --  3.1*  CL 99  --  103  CO2 23  --  22  GLUCOSE 125*  --  113*  BUN 20  --  16  CREATININE 1.69* 1.38* 1.22  CALCIUM 8.7*  --  8.4*  PROT  --    --  6.1*  ALBUMIN  --   --  3.1*  AST  --   --  32  ALT  --   --  21  ALKPHOS  --   --  49  BILITOT  --   --  0.9  GFRNONAA 40* 51* 59*  GFRAA 46* 59* >60  ANIONGAP 13  --  12     Hematology Recent Labs  Lab 02/03/20 0426 02/03/20 0900 02/04/20 0607  WBC 3.9* 3.4* 3.9*  RBC 4.28 4.21* 3.83*  HGB 12.9* 12.8* 11.4*  HCT 38.9* 39.4 34.7*  MCV 90.9 93.6 90.6  MCH 30.1 30.4 29.8  MCHC 33.2 32.5 32.9  RDW 12.7 12.9 12.9  PLT 183 187 158    Cardiac EnzymesNo results for input(s): TROPONINI in the last 168 hours. No results for input(s): TROPIPOC in the last 168 hours.   BNP Recent Labs  Lab 02/03/20 0900  BNP 147.2*     DDimer  Recent Labs  Lab 02/03/20 0515  DDIMER 2.20*     Radiology    VAS US CAROTID  Result Date: 02/04/2020 Carotid Arterial Duplex Study Indications:       Syncope. Risk Factors:      Hypertension, hyperlipidemia. Comparison Study:  no prior Performing Technologist: Abram Sander RVS  Examination Guidelines: A complete evaluation includes B-mode imaging, spectral Doppler, color Doppler, and power Doppler as needed of all accessible portions of each vessel. Bilateral testing is considered an integral part of a complete examination. Limited examinations for reoccurring indications may be performed as noted.  Right Carotid Findings: +----------+--------+--------+--------+------------------+--------+           PSV cm/sEDV cm/sStenosisPlaque DescriptionComments +----------+--------+--------+--------+------------------+--------+ CCA Prox  102     7               heterogenous               +----------+--------+--------+--------+------------------+--------+ CCA Distal87      14              heterogenous               +----------+--------+--------+--------+------------------+--------+ ICA Prox  80      22      1-39%   heterogenous               +----------+--------+--------+--------+------------------+--------+ ICA Distal115     32                                          +----------+--------+--------+--------+------------------+--------+ ECA       75                                                 +----------+--------+--------+--------+------------------+--------+ +----------+--------+-------+--------+-------------------+           PSV cm/sEDV cmsDescribeArm Pressure (mmHG) +----------+--------+-------+--------+-------------------+ [id number]                                        +----------+--------+-------+--------+-------------------+ +---------+--------+--+--------+--+---------+ VertebralPSV cm/s58EDV cm/s12Antegrade +---------+--------+--+--------+--+---------+  Left Carotid Findings: +----------+--------+--------+--------+------------------+--------+           PSV cm/sEDV cm/sStenosisPlaque DescriptionComments +----------+--------+--------+--------+------------------+--------+ CCA Prox  142     16              heterogenous               +----------+--------+--------+--------+------------------+--------+ CCA Distal104     17              heterogenous               +----------+--------+--------+--------+------------------+--------+ ICA Prox  65      18      1-39%   heterogenous               +----------+--------+--------+--------+------------------+--------+ ICA Distal84      22                                         +----------+--------+--------+--------+------------------+--------+ ECA       105                                                +----------+--------+--------+--------+------------------+--------+ +----------+--------+--------+--------+-------------------+  PSV cm/sEDV cm/sDescribeArm Pressure (mmHG) +----------+--------+--------+--------+-------------------+ Subclavian127                                         +----------+--------+--------+--------+-------------------+ +---------+--------+--+--------+--+---------+ VertebralPSV  cm/s51EDV cm/s14Antegrade +---------+--------+--+--------+--+---------+   Summary: Right Carotid: Velocities in the right ICA are consistent with a 1-39% stenosis. Left Carotid: Velocities in the left ICA are consistent with a 1-39% stenosis. Vertebrals: Bilateral vertebral arteries demonstrate antegrade flow. *See table(s) above for measurements and observations.  Electronically signed by Servando Snare MD on 02/04/2020 at 7:10:03 PM.    Final    ECHOCARDIOGRAM LIMITED  Result Date: 02/03/2020    ECHOCARDIOGRAM LIMITED REPORT   Patient Name:   Kevin Merritt Date of Exam: 02/03/2020 Medical Rec #:  [id number]       Height:       77.0 in Accession #:    [id number]      Weight:       247.6 lb Date of Birth:  [birth year]       BSA:          2.449 m Patient Age:    69 years        BP:           143/69 mmHg Patient Gender: M               HR:           79 bpm. Exam Location:  Inpatient Procedure: Limited Echo, Cardiac Doppler and Color Doppler Indications:    Elevated Troponin  History:        Patient has prior history of Echocardiogram examinations, most                 recent 10/28/2013. Arrythmias:Atrial Fibrillation; Risk                 Factors:Hypertension, Dyslipidemia and Obesity. COVID+, CKD.  Sonographer:    Dustin Flock Referring Phys: [id number] [place]  1. Left ventricular ejection fraction, by estimation, is 60 to 65%. The left ventricle has normal function. The left ventricle has no regional wall motion abnormalities. There is moderate concentric left ventricular hypertrophy. Left ventricular diastolic parameters are consistent with Grade II diastolic dysfunction (pseudonormalization).  2. Right ventricular systolic function is normal. The right ventricular size is normal.  3. Trivial mitral valve regurgitation.  4. There is mild calcification of the aortic valve. There is mild thickening of the aortic valve. Aortic valve regurgitation is mild. Mild aortic valve sclerosis is  present, with no evidence of aortic valve stenosis. Comparison(s): No prior Echocardiogram. FINDINGS  Left Ventricle: Left ventricular ejection fraction, by estimation, is 60 to 65%. The left ventricle has normal function. The left ventricle has no regional wall motion abnormalities. There is moderate concentric left ventricular hypertrophy. Left ventricular diastolic parameters are consistent with Grade II diastolic dysfunction (pseudonormalization). Right Ventricle: The right ventricular size is normal. Right ventricular systolic function is normal. Pericardium: There is no evidence of pericardial effusion. Mitral Valve: There is mild thickening of the mitral valve leaflet(s). Mild mitral annular calcification. Trivial mitral valve regurgitation. Tricuspid Valve: The tricuspid valve is normal in structure. Tricuspid valve regurgitation is not demonstrated. Aortic Valve: There is mild calcification of the aortic valve. There is mild thickening of the aortic valve. Aortic valve regurgitation is mild. Aortic regurgitation PHT measures 476 msec. Mild aortic valve sclerosis is present, with no  evidence of aortic valve stenosis. Pulmonic Valve: The pulmonic valve was not assessed. Aorta: The aortic root is normal in size and structure. LEFT VENTRICLE PLAX 2D LVIDd:         4.00 cm  Diastology LVIDs:         2.70 cm  LV e' medial:    4.13 cm/s LV PW:         1.70 cm  LV E/e' medial:  10.7 LV IVS:        1.70 cm  LV e' lateral:   6.09 cm/s LVOT diam:     2.30 cm  LV E/e' lateral: 7.2 LVOT Area:     4.15 cm  RIGHT VENTRICLE RV S prime:     5.00 cm/s LEFT ATRIUM         Index LA diam:    3.60 cm 1.47 cm/m  AORTIC VALVE AI PHT:      476 msec  AORTA Ao Root diam: 3.60 cm MITRAL VALVE MV Area (PHT): 4.80 cm    SHUNTS MV Decel Time: 158 msec    Systemic Diam: 2.30 cm MV E velocity: 44.10 cm/s MV A velocity: 43.30 cm/s MV E/A ratio:  1.02 Gwyndolyn Kaufman MD Electronically signed by Gwyndolyn Kaufman MD Signature Date/Time:  02/03/2020/3:29:33 PM    Final     Cardiac Studies   ECHO:  1. Left ventricular ejection fraction, by estimation, is 60 to 65%. The  left ventricle has normal function. The left ventricle has no regional  wall motion abnormalities. There is moderate concentric left ventricular  hypertrophy. Left ventricular  diastolic parameters are consistent with Grade II diastolic dysfunction  (pseudonormalization).  2. Right ventricular systolic function is normal. The right ventricular  size is normal.  3. Trivial mitral valve regurgitation.  4. There is mild calcification of the aortic valve. There is mild  thickening of the aortic valve. Aortic valve regurgitation is mild. Mild  aortic valve sclerosis is present, with no evidence of aortic valve  stenosis.   Patient Profile     69 y.o. male with a hx of HTN, HLD, CKD stage III by labs (1.2-1.4 per [facility]), obesity, prostate CAwho is being seen today for the evaluation ofatrial fibrillation and syncopeat the request of Dr. Doristine Bosworth. He was found to have orthostasis and Covid-19 infection in the ED.  Assessment & Plan    SYNCOPE:     He was still orthostatic yesterday.  I will order compression knee highs and not restart his ARB.  I will be going up on the Cardizem as below.  We discussed orthostatic precautions with moving slowly from one position to the next.   ATRIAL FIB:    As per discussion yesterday, without recurrent fib, I am not suggesting starting DOAC at this time.  We will be sending him a monitor to wear at home.     HYPOKALEMIA:   Repeat BMET.    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  I will consider further work up as an out patient.  For now control HTN being aware of the orthostasis.    For questions or updates, please contact [place] Please consult [email or web address] for contact info under Cardiology/STEMI.   Signed, Minus Breeding, MD  02/05/2020, 9:03 AM

## 2020-02-05 NOTE — TOC Progression Note (Signed)
Transition of Care [facility]) - Progression Note    Patient Details  Name: Kevin Merritt MRN: [id number] Date of Birth: [birth year]  Transition of Care [facility]) CM/SW [place], RN Phone Number: 02/05/2020, 1:10 PM  Clinical Narrative:    Spoke to patient over hte phone, he states his wife and son have now tested positive. He is not adverse to receiving home health, but worry about them as his whole family is positive.  He states he has a walker at home. He will confirm this with wife. Sent over via tube system a copy of choices of home health compare. He will look it over and choose who to call and let me know this afternoon.   Expected Discharge Plan: [place] Barriers to Discharge: Continued Medical Work up  Expected Discharge Plan and Services Expected Discharge Plan: [place] arrangements for the past 2 months: Single Family Home                                       Social Determinants of Health (SDOH) Interventions    Readmission Risk Interventions No flowsheet data found.

## 2020-02-05 NOTE — TOC Progression Note (Signed)
Transition of Care [facility]) - Progression Note    Patient Details  Name: Kevin Merritt MRN: [id number] Date of Birth: 02/11/1951  Transition of Care [facility]) CM/SW [place], RN Phone Number: 02/05/2020, 4:02 PM  Clinical Narrative:    Patients choice is [facility]. Orders are not in yet, patient will likely not go home until Saturday or Sunday.  Will need to arrange when orders placed. Patient has a walker at home, does not need another one.    Expected Discharge Plan: [place] Barriers to Discharge: Continued Medical Work up  Expected Discharge Plan and Services Expected Discharge Plan: [place] arrangements for the past 2 months: Single Family Home                                       Social Determinants of Health (SDOH) Interventions    Readmission Risk Interventions No flowsheet data found.

## 2020-02-06 ENCOUNTER — Inpatient Hospital Stay (HOSPITAL_COMMUNITY): Payer: Medicare HMO

## 2020-02-06 DIAGNOSIS — U071 COVID-19: Secondary | ICD-10-CM

## 2020-02-06 LAB — COMPREHENSIVE METABOLIC PANEL
ALT: 19 U/L (ref 0–44)
AST: 25 U/L (ref 15–41)
Albumin: 2.9 g/dL — ABNORMAL LOW (ref 3.5–5.0)
Alkaline Phosphatase: 53 U/L (ref 38–126)
Anion gap: 8 (ref 5–15)
BUN: 10 mg/dL (ref 8–23)
CO2: 24 mmol/L (ref 22–32)
Calcium: 8.5 mg/dL — ABNORMAL LOW (ref 8.9–10.3)
Chloride: 106 mmol/L (ref 98–111)
Creatinine, Ser: 1.08 mg/dL (ref 0.61–1.24)
GFR calc Af Amer: >60 mL/min (ref >60)
GFR calc non Af Amer: >60 mL/min (ref >60)
Glucose, Bld: 124 mg/dL — ABNORMAL HIGH (ref 70–99)
Potassium: 3.6 mmol/L (ref 3.5–5.1)
Sodium: 138 mmol/L (ref 135–145)
Total Bilirubin: 0.7 mg/dL (ref 0.3–1.2)
Total Protein: 5.9 g/dL — ABNORMAL LOW (ref 6.5–8.1)

## 2020-02-06 LAB — CORTISOL: Cortisol, Plasma: 4.2 ug/dL

## 2020-02-06 LAB — HEPARIN LEVEL (UNFRACTIONATED): Heparin Unfractionated: <0.1 IU/mL — ABNORMAL LOW (ref 0.30–0.70)

## 2020-02-06 LAB — CBC
HCT: 31.1 % — ABNORMAL LOW (ref 39.0–52.0)
Hemoglobin: 10.3 g/dL — ABNORMAL LOW (ref 13.0–17.0)
MCH: 29.9 pg (ref 26.0–34.0)
MCHC: 33.1 g/dL (ref 30.0–36.0)
MCV: 90.1 fL (ref 80.0–100.0)
Platelets: 141 10*3/uL — ABNORMAL LOW (ref 150–400)
RBC: 3.45 MIL/uL — ABNORMAL LOW (ref 4.22–5.81)
RDW: 12.7 % (ref 11.5–15.5)
WBC: 3.8 10*3/uL — ABNORMAL LOW (ref 4.0–10.5)
nRBC: 0 % (ref 0.0–0.2)

## 2020-02-06 LAB — D-DIMER, QUANTITATIVE: D-Dimer, Quant: 0.85 ug/mL-FEU — ABNORMAL HIGH (ref 0.00–0.50)

## 2020-02-06 LAB — GLUCOSE, CAPILLARY: Glucose-Capillary: 108 mg/dL — ABNORMAL HIGH (ref 70–99)

## 2020-02-06 LAB — C-REACTIVE PROTEIN: CRP: 2.7 mg/dL — ABNORMAL HIGH (ref <1.0)

## 2020-02-06 MED ORDER — COSYNTROPIN 0.25 MG IJ SOLR
0.2500 mg | Freq: Once | INTRAMUSCULAR | Status: AC
  Administered 2020-02-07: 0.25 mg via INTRAVENOUS
  Filled 2020-02-06: qty 0.25

## 2020-02-06 MED ORDER — SODIUM CHLORIDE 0.9 % IV SOLN
200.0000 mg | Freq: Once | INTRAVENOUS | Status: AC
  Administered 2020-02-06: 200 mg via INTRAVENOUS
  Filled 2020-02-06: qty 40

## 2020-02-06 MED ORDER — SODIUM CHLORIDE 0.9 % IV SOLN
100.0000 mg | Freq: Every day | INTRAVENOUS | Status: DC
  Administered 2020-02-07: 100 mg via INTRAVENOUS
  Filled 2020-02-06: qty 20

## 2020-02-06 MED ORDER — ENOXAPARIN SODIUM 40 MG/0.4ML ~~LOC~~ SOLN
40.0000 mg | Freq: Every day | SUBCUTANEOUS | Status: DC
  Administered 2020-02-06 – 2020-02-07 (×2): 40 mg via SUBCUTANEOUS
  Filled 2020-02-06 (×2): qty 0.4

## 2020-02-06 MED ORDER — SENNOSIDES-DOCUSATE SODIUM 8.6-50 MG PO TABS
2.0000 | ORAL_TABLET | Freq: Two times a day (BID) | ORAL | Status: DC
  Administered 2020-02-06 – 2020-02-07 (×3): 2 via ORAL
  Filled 2020-02-06 (×3): qty 2

## 2020-02-06 MED ORDER — POLYETHYLENE GLYCOL 3350 17 G PO PACK
17.0000 g | PACK | Freq: Once | ORAL | Status: AC
  Administered 2020-02-06: 17 g via ORAL
  Filled 2020-02-06: qty 1

## 2020-02-06 NOTE — Plan of Care (Signed)
  Problem: Education: Goal: Knowledge of disease or condition will improve Outcome: Progressing Goal: Understanding of medication regimen will improve Outcome: Progressing Goal: Individualized Educational Video(s) Outcome: Progressing   Problem: Activity: Goal: Ability to tolerate increased activity will improve Outcome: Progressing   Problem: Cardiac: Goal: Ability to achieve and maintain adequate cardiopulmonary perfusion will improve Outcome: Progressing

## 2020-02-06 NOTE — Progress Notes (Signed)
PROGRESS NOTE                                                                                                                                                                                                             Patient Demographics:    Kevin Merritt, is a 69 y.o. male, DOB - [birth year], XTK:[id number]  Admit date - 02/03/2020   Admitting Physician Mckinley Jewel, MD  Outpatient Primary MD for the patient is [name], MD  LOS - 3   Chief Complaint  Patient presents with  . Loss of Consciousness       Brief Narrative    69 y.o. male with medical history significant of hypertension, hyperlipidemia, prostate cancer-on IV testosterone injection every 50-month, morbid obesity presents to emergency department for evaluation of syncope. -He tested positive for COVID-19, noted to be in A. fib, cardiology were consulted.   Subjective:    Kevin Merritt today reports some cough and congestion, no further dizziness or lightheadedness, denies any dyspnea or chest pain .    Assessment  & Plan :    Principal Problem:   Syncope Active Problems:   Essential hypertension   HLD (hyperlipidemia)   Atrial fibrillation with RVR (HCC)   AKI (acute kidney injury) ([place])   COVID-19   Syncope: -Related to orthostatic hypotension, this has improved with IV fluids, he is mildly orthostatic today, and he reports mild dizziness upon standing . -NO Significant events on telemetry . -No significant stenosis on carotid Dopplers . -We will change Flomax to evening . -Cortisol level on the lower side x2, will proceed with cosyntropin test tomorrow morning.  Atrial fibrillation -He did convert to normal sinus rhythm, he remains on Cardizem p.o. been consolidated to CD form -Management per cardiology, this is most likely related to acute illness, no recommendation for long-term anticoagulation, they will arrange for Holter monitor as an outpatient,  stopped heparin GTT, continue with aspirin.  Hypokalemia -Repleted, recheck BMP for today  Hypertension -Blood pressure better controlled on current dose of Cardizem CD, . -continue with Cardizem CD, but continue to hold amlodipine, losartan-HCTZ  COVID-19 infection/pneumonia -This is incidental finding on admission, appears to be a early in the course of the disease on admission, appears to be progressing, repeat x-ray this morning showing evidence of new multifocal pneumonia consistent with COVID-19, will  go ahead and start on remdesivir for now. -Continue to trend inflammatory markers -Received monoclonal antibody on. -So far no indication for steroids or remdesivir  COVID-19 Labs  Recent Labs    02/05/20 1144 02/06/20 0142  DDIMER 0.57* 0.85*  CRP 1.8* 2.7*    Lab Results  Component Value Date   SARSCOV2NAA POSITIVE (A) 02/03/2020    Severe LVH:  -Due to hypertension, work-up as an outpatient per cardiology .  Ascending aorta aneurysm: Noted on CT chest.  4.3 cm -Follow-up outpatient  Thyroid nodularity:  - Noted on CT chest.   TSH WNL, Follow-up outpatient.  Hyperlipidemia: Continue statin  AKI on CKD stage III: -Creatinine: 1.69, GFR: 46 -Received IV fluids in ED.  Hold nephrotoxic medication valsartan-HCTZ for now. Continue with IV fluids -Improving  History of prostate cancer:  -He is followed as an outpatient by Dr. Gloriann Loan from [facility] urology. -Patient with BPH, he was started recently on Flomax, dose was moved to evening time given orthostasis.   COVID-19 Labs  Recent Labs    02/05/20 1144 02/06/20 0142  DDIMER 0.57* 0.85*  CRP 1.8* 2.7*    Lab Results  Component Value Date   SARSCOV2NAA POSITIVE (A) 02/03/2020     Code Status : Full  Family Communication  : Left wife a voicemail 9/25  Disposition Plan  :  Status is: Inpatient  Remains inpatient appropriate because:Hemodynamically unstable and IV treatments appropriate due to  intensity of illness or inability to take PO   Dispo: The patient is from: Home              Anticipated d/c is to: Home              Anticipated d/c date is: 1 day              Patient currently is not medically stable to d/c.        Barriers For Discharge :   Consults  :  Cardiology  Procedures  : none  DVT Prophylaxis  :  Heparin GTT  Lab Results  Component Value Date   PLT 141 (L) 02/06/2020    Antibiotics  :   Anti-infectives (From admission, onward)   Start     Dose/Rate Route Frequency Ordered Stop   02/07/20 1000  remdesivir 100 mg in sodium chloride 0.9 % 100 mL IVPB       "Followed by" Linked Group Details   100 mg 200 mL/hr over 30 Minutes Intravenous Daily 02/06/20 1258 02/11/20 0959   02/06/20 1300  remdesivir 200 mg in sodium chloride 0.9% 250 mL IVPB       "Followed by" Linked Group Details   200 mg 580 mL/hr over 30 Minutes Intravenous Once 02/06/20 1258          Objective:   Vitals:   02/05/20 2013 02/06/20 0516 02/06/20 0757 02/06/20 1024  BP: (!) 152/76 (!) 151/74 (!) 147/74 134/65  Pulse: 86 77 77   Resp: 20 18 17    Temp: 99.1 F (37.3 C) 99.4 F (37.4 C) 98.1 F (36.7 C)   TempSrc: Oral Oral Oral   SpO2: 100% 99% 100%   Weight:  128.7 kg    Height:        Wt Readings from Last 3 Encounters:  02/06/20 128.7 kg  11/18/13 112.3 kg  10/15/13 113.4 kg     Intake/Output Summary (Last 24 hours) at 02/06/2020 1302 Last data filed at 02/06/2020 1026 Gross per 24 hour  Intake  423 ml  Output 1550 ml  Net -1127 ml     Physical Exam  Awake Alert, Oriented X 3, No new F.N deficits, Normal affect Symmetrical Chest wall movement, Good air movement bilaterally, CTAB RRR,No Gallops,Rubs or new Murmurs, No Parasternal Heave +ve B.Sounds, Abd Soft, No tenderness, No rebound - guarding or rigidity. No Cyanosis, Clubbing or edema, No new Rash or bruise         Data Review:    CBC Recent Labs  Lab 02/03/20 0426 02/03/20 0900  02/04/20 0607 02/05/20 1144 02/06/20 0142  WBC 3.9* 3.4* 3.9* 3.9* 3.8*  HGB 12.9* 12.8* 11.4* 10.8* 10.3*  HCT 38.9* 39.4 34.7* 32.9* 31.1*  PLT 183 187 158 150 141*  MCV 90.9 93.6 90.6 90.4 90.1  MCH 30.1 30.4 29.8 29.7 29.9  MCHC 33.2 32.5 32.9 32.8 33.1  RDW 12.7 12.9 12.9 12.8 12.7  LYMPHSABS 1.4  --   --   --   --   MONOABS 0.4  --   --   --   --   EOSABS 0.0  --   --   --   --   BASOSABS 0.0  --   --   --   --     Chemistries  Recent Labs  Lab 02/03/20 0426 02/03/20 0515 02/03/20 0900 02/04/20 0607 02/04/20 0945 02/05/20 1144 02/06/20 0142  NA 135  --   --  137  --  134* 138  K 3.6  --   --  3.1*  --  3.6 3.6  CL 99  --   --  103  --  103 106  CO2 23  --   --  22  --  22 24  GLUCOSE 125*  --   --  113*  --  105* 124*  BUN 20  --   --  16  --  9 10  CREATININE 1.69*  --  1.38* 1.22  --  1.06 1.08  CALCIUM 8.7*  --   --  8.4*  --  8.4* 8.5*  MG  --  1.9  --   --  1.9  --   --   AST  --   --   --  32  --  28 25  ALT  --   --   --  21  --  19 19  ALKPHOS  --   --   --  49  --  48 53  BILITOT  --   --   --  0.9  --  0.5 0.7   ------------------------------------------------------------------------------------------------------------------ No results for input(s): CHOL, HDL, LDLCALC, TRIG, CHOLHDL, LDLDIRECT in the last 72 hours.  No results found for: HGBA1C ------------------------------------------------------------------------------------------------------------------ No results for input(s): TSH, T4TOTAL, T3FREE, THYROIDAB in the last 72 hours.  Invalid input(s): FREET3 ------------------------------------------------------------------------------------------------------------------ No results for input(s): VITAMINB12, FOLATE, FERRITIN, TIBC, IRON, RETICCTPCT in the last 72 hours.  Coagulation profile No results for input(s): INR, PROTIME in the last 168 hours.  Recent Labs    02/05/20 1144 02/06/20 0142  DDIMER 0.57* 0.85*    Cardiac Enzymes No  results for input(s): CKMB, TROPONINI, MYOGLOBIN in the last 168 hours.  Invalid input(s): CK ------------------------------------------------------------------------------------------------------------------    Component Value Date/Time   BNP 147.2 (H) 02/03/2020 0900    Inpatient Medications  Scheduled Meds: . vitamin C  500 mg Oral Daily  . aspirin EC  81 mg Oral Daily  . atorvastatin  20 mg Oral Daily  . diltiazem  240  mg Oral Daily  . senna-docusate  2 tablet Oral BID  . sodium chloride flush  3 mL Intravenous Q12H  . tamsulosin  0.4 mg Oral QPC supper   Continuous Infusions: . sodium chloride    . famotidine (PEPCID) IV    . remdesivir 200 mg in sodium chloride 0.9% 250 mL IVPB     Followed by  . [START ON 02/07/2020] remdesivir 100 mg in NS 100 mL     PRN Meds:.sodium chloride, acetaminophen **OR** acetaminophen, diphenhydrAMINE, EPINEPHrine, famotidine (PEPCID) IV, methylPREDNISolone (SOLU-MEDROL) injection, ondansetron **OR** ondansetron (ZOFRAN) IV, oxyCODONE  Micro Results Recent Results (from the past 240 hour(s))  SARS Coronavirus 2 by RT PCR (hospital order, performed in [place] hospital lab) Nasopharyngeal Nasopharyngeal Swab     Status: Abnormal   Collection Time: 02/03/20  5:51 AM   Specimen: Nasopharyngeal Swab  Result Value Ref Range Status   SARS Coronavirus 2 POSITIVE (A) NEGATIVE Final    Comment: emailed L. Berdik RN 8:30 02/03/20 ([email or web address]) (NOTE) SARS-CoV-2 target nucleic acids are DETECTED  SARS-CoV-2 RNA is generally detectable in upper respiratory specimens  during the acute phase of infection.  Positive results are indicative  of the presence of the identified virus, but do not rule out bacterial infection or co-infection with other pathogens not detected by the test.  Clinical correlation with patient history and  other diagnostic information is necessary to determine patient infection status.  The expected result is negative.  Fact Sheet  for Patients:   StrictlyIdeas.no   Fact Sheet for Healthcare Providers:   BankingDealers.co.za    This test is not yet approved or cleared by the [place] FDA and  has been authorized for detection and/or diagnosis of SARS-CoV-2 by FDA under an Emergency Use Authorization (EUA).  This EUA will remain in effect (meaning this test can be used) for the duration of  the  COVID-19 declaration under Section 564(b)(1) of the Act, 21 U.S.C. section 360-bbb-3(b)(1), unless the authorization is terminated or revoked sooner.  Performed at [facility], [place] [address] [place], [place] [address]     Radiology Reports CT Head Wo Contrast  Result Date: 02/03/2020 CLINICAL DATA:  Syncope with minor head trauma EXAM: CT HEAD WITHOUT CONTRAST TECHNIQUE: Contiguous axial images were obtained from the base of the skull through the vertex without intravenous contrast. COMPARISON:  None. FINDINGS: Brain: No evidence of acute infarction, hemorrhage, hydrocephalus, extra-axial collection or mass lesion/mass effect. Vascular: No hyperdense vessel or unexpected calcification. Skull: Normal. Negative for fracture or focal lesion. Sinuses/Orbits: Partially covered right maxillary sinus which is essentially completely opacified where seen. There is internal high and low-density components likely from mucosal edema and trapped secretions. IMPRESSION: 1. No evidence of intracranial injury. 2. Essentially complete opacification of the right maxillary sinus that is new from a 2010 neck CT Electronically Signed   By: Monte Fantasia M.D.   On: 02/03/2020 04:59   CT Angio Chest PE W and/or Wo Contrast  Result Date: 02/03/2020 CLINICAL DATA:  Syncope, tachycardia, dizziness, elevated D-dimer EXAM: CT ANGIOGRAPHY CHEST WITH CONTRAST TECHNIQUE: Multidetector CT imaging of the chest was performed using the standard protocol during bolus administration of intravenous  contrast. Multiplanar CT image reconstructions and MIPs were obtained to evaluate the vascular anatomy. CONTRAST:  75mL OMNIPAQUE IOHEXOL 350 MG/ML SOLN COMPARISON:  None. FINDINGS: Cardiovascular: There is excellent opacification of the pulmonary arterial tree and there is no intraluminal filling defect identified to suggest acute pulmonary embolism. The central  pulmonary arteries are enlarged in keeping with changes of pulmonary arterial hypertension. There is mild coronary artery calcification. There is mild global cardiomegaly with severe left ventricular hypertrophy. There is mild aneurysmal dilation of the a thoracic aorta which measures 4.3 cm in greatest dimension in its ascending segment. The aorta assumes a more normal caliber in its descending segment measuring 3.2 cm just beyond the takeoff of the left subclavian artery and 2.9 cm at the level of the left atrium. Mediastinum/Nodes: There is nodular enlargement of the right thyroid gland, not well assessed on this examination and new since prior neck CT examination of 04/29/2009. No pathologic thoracic adenopathy. The esophagus is unremarkable. Lungs/Pleura: Mild bibasilar atelectasis. There is mild thickening of the peribronchovascular interstitium likely representing trace bilateral perihilar pulmonary edema. No confluent pulmonary infiltrate. No pneumothorax or pleural effusion. The central airways are widely patent. Upper Abdomen: Unremarkable Musculoskeletal: No acute bone abnormality. Review of the MIP images confirms the above findings. IMPRESSION: No pulmonary embolism. Severe left ventricular hypertrophy. Pulmonary arterial hypertension, possibly the result of diastolic dysfunction,. This could be better assessed with echocardiography, if indicated. Trace perihilar pulmonary edema, likely cardiogenic in nature. 4.3 cm ascending aortic aneurysm. Right thyroid nodularity, poorly assessed on this examination. This could be better assessed with  dedicated thyroid sonography once the patient's acute issues have resolved, if clinically indicated. Aortic Atherosclerosis (ICD10-I70.0). Aortic aneurysm NOS (ICD10-I71.9). Electronically Signed   By: Fidela Salisbury MD   On: 02/03/2020 07:02   DG Chest Port 1 View  Result Date: 02/06/2020 CLINICAL DATA:  History of COVID-19. EXAM: PORTABLE CHEST 1 VIEW COMPARISON:  Chest radiograph 09/16/2013. FINDINGS: Monitoring leads overlie the patient. Cardiac contours upper limits of normal. Mild bilateral peripheral airspace opacities. No pleural effusion or pneumothorax. Thoracic spine degenerative changes. IMPRESSION: Mild bilateral peripheral airspace opacities may represent atypical infectious process. Electronically Signed   By: Lovey Newcomer M.D.   On: 02/06/2020 09:44   VAS US CAROTID  Result Date: 02/04/2020 Carotid Arterial Duplex Study Indications:       Syncope. Risk Factors:      Hypertension, hyperlipidemia. Comparison Study:  no prior Performing Technologist: Abram Sander RVS  Examination Guidelines: A complete evaluation includes B-mode imaging, spectral Doppler, color Doppler, and power Doppler as needed of all accessible portions of each vessel. Bilateral testing is considered an integral part of a complete examination. Limited examinations for reoccurring indications may be performed as noted.  Right Carotid Findings: +----------+--------+--------+--------+------------------+--------+           PSV cm/sEDV cm/sStenosisPlaque DescriptionComments +----------+--------+--------+--------+------------------+--------+ CCA Prox  102     7               heterogenous               +----------+--------+--------+--------+------------------+--------+ CCA Distal87      14              heterogenous               +----------+--------+--------+--------+------------------+--------+ ICA Prox  80      22      1-39%   heterogenous                +----------+--------+--------+--------+------------------+--------+ ICA Distal115     32                                         +----------+--------+--------+--------+------------------+--------+ ECA  75                                                 +----------+--------+--------+--------+------------------+--------+ +----------+--------+-------+--------+-------------------+           PSV cm/sEDV cmsDescribeArm Pressure (mmHG) +----------+--------+-------+--------+-------------------+ [id number]                                        +----------+--------+-------+--------+-------------------+ +---------+--------+--+--------+--+---------+ VertebralPSV cm/s58EDV cm/s12Antegrade +---------+--------+--+--------+--+---------+  Left Carotid Findings: +----------+--------+--------+--------+------------------+--------+           PSV cm/sEDV cm/sStenosisPlaque DescriptionComments +----------+--------+--------+--------+------------------+--------+ CCA Prox  142     16              heterogenous               +----------+--------+--------+--------+------------------+--------+ CCA Distal104     17              heterogenous               +----------+--------+--------+--------+------------------+--------+ ICA Prox  65      18      1-39%   heterogenous               +----------+--------+--------+--------+------------------+--------+ ICA Distal84      22                                         +----------+--------+--------+--------+------------------+--------+ ECA       105                                                +----------+--------+--------+--------+------------------+--------+ +----------+--------+--------+--------+-------------------+           PSV cm/sEDV cm/sDescribeArm Pressure (mmHG) +----------+--------+--------+--------+-------------------+ Subclavian127                                          +----------+--------+--------+--------+-------------------+ +---------+--------+--+--------+--+---------+ VertebralPSV cm/s51EDV cm/s14Antegrade +---------+--------+--+--------+--+---------+   Summary: Right Carotid: Velocities in the right ICA are consistent with a 1-39% stenosis. Left Carotid: Velocities in the left ICA are consistent with a 1-39% stenosis. Vertebrals: Bilateral vertebral arteries demonstrate antegrade flow. *See table(s) above for measurements and observations.  Electronically signed by Servando Snare MD on 02/04/2020 at 7:10:03 PM.    Final    ECHOCARDIOGRAM LIMITED  Result Date: 02/03/2020    ECHOCARDIOGRAM LIMITED REPORT   Patient Name:   ASCHER SCHROEPFER Date of Exam: 02/03/2020 Medical Rec #:  [id number]       Height:       77.0 in Accession #:    [id number]      Weight:       247.6 lb Date of Birth:  [birth year]       BSA:          2.449 m Patient Age:    62 years        BP:           143/69 mmHg Patient Gender: M  HR:           79 bpm. Exam Location:  Inpatient Procedure: Limited Echo, Cardiac Doppler and Color Doppler Indications:    Elevated Troponin  History:        Patient has prior history of Echocardiogram examinations, most                 recent 10/28/2013. Arrythmias:Atrial Fibrillation; Risk                 Factors:Hypertension, Dyslipidemia and Obesity. COVID+, CKD.  Sonographer:    Dustin Flock Referring Phys: [id number] [place]  1. Left ventricular ejection fraction, by estimation, is 60 to 65%. The left ventricle has normal function. The left ventricle has no regional wall motion abnormalities. There is moderate concentric left ventricular hypertrophy. Left ventricular diastolic parameters are consistent with Grade II diastolic dysfunction (pseudonormalization).  2. Right ventricular systolic function is normal. The right ventricular size is normal.  3. Trivial mitral valve regurgitation.  4. There is mild calcification of the aortic  valve. There is mild thickening of the aortic valve. Aortic valve regurgitation is mild. Mild aortic valve sclerosis is present, with no evidence of aortic valve stenosis. Comparison(s): No prior Echocardiogram. FINDINGS  Left Ventricle: Left ventricular ejection fraction, by estimation, is 60 to 65%. The left ventricle has normal function. The left ventricle has no regional wall motion abnormalities. There is moderate concentric left ventricular hypertrophy. Left ventricular diastolic parameters are consistent with Grade II diastolic dysfunction (pseudonormalization). Right Ventricle: The right ventricular size is normal. Right ventricular systolic function is normal. Pericardium: There is no evidence of pericardial effusion. Mitral Valve: There is mild thickening of the mitral valve leaflet(s). Mild mitral annular calcification. Trivial mitral valve regurgitation. Tricuspid Valve: The tricuspid valve is normal in structure. Tricuspid valve regurgitation is not demonstrated. Aortic Valve: There is mild calcification of the aortic valve. There is mild thickening of the aortic valve. Aortic valve regurgitation is mild. Aortic regurgitation PHT measures 476 msec. Mild aortic valve sclerosis is present, with no evidence of aortic valve stenosis. Pulmonic Valve: The pulmonic valve was not assessed. Aorta: The aortic root is normal in size and structure. LEFT VENTRICLE PLAX 2D LVIDd:         4.00 cm  Diastology LVIDs:         2.70 cm  LV e' medial:    4.13 cm/s LV PW:         1.70 cm  LV E/e' medial:  10.7 LV IVS:        1.70 cm  LV e' lateral:   6.09 cm/s LVOT diam:     2.30 cm  LV E/e' lateral: 7.2 LVOT Area:     4.15 cm  RIGHT VENTRICLE RV S prime:     5.00 cm/s LEFT ATRIUM         Index LA diam:    3.60 cm 1.47 cm/m  AORTIC VALVE AI PHT:      476 msec  AORTA Ao Root diam: 3.60 cm MITRAL VALVE MV Area (PHT): 4.80 cm    SHUNTS MV Decel Time: 158 msec    Systemic Diam: 2.30 cm MV E velocity: 44.10 cm/s MV A velocity:  43.30 cm/s MV E/A ratio:  1.02 Gwyndolyn Kaufman MD Electronically signed by Gwyndolyn Kaufman MD Signature Date/Time: 02/03/2020/3:29:33 PM    Final       Phillips Climes M.D on 02/06/2020 at 1:02 PM    [facility] -  Office  [phone]     

## 2020-02-06 NOTE — Progress Notes (Signed)
Occupational Therapy Treatment Patient Details Name: Kevin Merritt MRN: [id number] DOB: January 10, 1951 Today's Date: 02/06/2020    History of present illness Pt is 69 y.o male presenting after syncopal episode where he fell in his bathroom. COVID+, unvaccinated. PMH includes hypertension, hyperlipidemia, prostate cancer-on IV testosterone injection every 65-month, morbid obesity   OT comments  Pt with greatly improved balance, and performance today during OT. RW used at min guard for safety - but at one point he picked it up to walk. Can attempt next session without as Pt did not use previously. Pt able to perform UB ADL standing at sink, and LB ADL without dizziness. DC updated to No OT Follow up and intermittent supervision. Pt educated on importance of safety during bathing and use of a shower chair to prevent falls in the shower.OT will continue to follow acutely.   Follow Up Recommendations  Supervision - Intermittent    Equipment Recommendations  Tub/shower seat    Recommendations for Other Services      Precautions / Restrictions Precautions Precautions: Fall Precaution Comments: hx of syncopal epsiodes Restrictions Weight Bearing Restrictions: No       Mobility Bed Mobility Overal bed mobility: Needs Assistance Bed Mobility: Supine to Sit     Supine to sit: Supervision        Transfers Overall transfer level: Needs assistance Equipment used: Rolling walker (2 wheeled) (but I would try without it next time) Transfers: Sit to/from Stand Sit to Stand: Min guard         General transfer comment: min guard for safety. no physical assist needed at all    Balance Overall balance assessment: Needs assistance Sitting-balance support: Feet supported Sitting balance-Leahy Scale: Good     Standing balance support: Bilateral upper extremity supported;During functional activity;No upper extremity supported Standing balance-Leahy Scale: Fair                              ADL either performed or assessed with clinical judgement   ADL Overall ADL's : Needs assistance/impaired     Grooming: Min guard;Standing;Wash/dry hands;Oral care Grooming Details (indicate cue type and reason): no reports of dizziness or lightheadedness         Upper Body Dressing : Set up;Sitting Upper Body Dressing Details (indicate cue type and reason): extra gown like robe Lower Body Dressing: Sitting/lateral leans;Min guard Lower Body Dressing Details (indicate cue type and reason): to don socks Toilet Transfer: Ambulation;RW;Min guard   Toileting- Clothing Manipulation and Hygiene: Min guard (standing during urination)       Functional mobility during ADLs: Min guard;Rolling walker General ADL Comments: no reports of dizziness this session     Vision       Perception     Praxis      Cognition Arousal/Alertness: Awake/alert Behavior During Therapy: WFL for tasks assessed/performed Overall Cognitive Status: Within Functional Limits for tasks assessed                                          Exercises     Shoulder Instructions       General Comments      Pertinent Vitals/ Pain       Pain Assessment: Faces Faces Pain Scale: Hurts even more Pain Location: tail bone (had fallen on it PTA) Pain Descriptors / Indicators: Aching;Sore Pain Intervention(s): Monitored during  session;Other (comment) (extra pillows placed in recliner)  Home Living                                          Prior Functioning/Environment              Frequency  Min 2X/week        Progress Toward Goals  OT Goals(current goals can now be found in the care plan section)  Progress towards OT goals: Progressing toward goals  Acute Rehab OT Goals Patient Stated Goal: get home OT Goal Formulation: With patient Time For Goal Achievement: 02/18/20 Potential to Achieve Goals: Good  Plan Frequency remains appropriate;Discharge  plan needs to be updated    Co-evaluation                 AM-PAC OT "6 Clicks" Daily Activity     Outcome Measure   Help from another person eating meals?: None Help from another person taking care of personal grooming?: A Little Help from another person toileting, which includes using toliet, bedpan, or urinal?: A Little Help from another person bathing (including washing, rinsing, drying)?: A Little Help from another person to put on and taking off regular upper body clothing?: None Help from another person to put on and taking off regular lower body clothing?: None 6 Click Score: 21    End of Session Equipment Utilized During Treatment: Rolling walker  OT Visit Diagnosis: Unsteadiness on feet (R26.81);Other abnormalities of gait and mobility (R26.89);Muscle weakness (generalized) (M62.81)   Activity Tolerance Patient tolerated treatment well   Patient Left in chair;with call bell/phone within reach   Nurse Communication Mobility status        Time: 3646-8032 OT Time Calculation (min): 24 min  Charges: OT General Charges $OT Visit: 1 Visit OT Treatments $Self Care/Home Management : 23-37 mins  Jesse Sans OTR/L Acute Rehabilitation Services Pager: [phone] Office: [place] 02/06/2020, 5:51 PM

## 2020-02-06 NOTE — TOC Transition Note (Signed)
Transition of Care [facility]) - CM/SW Discharge Note   Patient Details  Name: Kevin Merritt MRN: [id number] Date of Birth: [birth year]  [facility] CM/SW Contact:  Carles Collet, RN Phone Number: 02/06/2020, 10:50 AM   Clinical Narrative:    [facility] unable to accept for [facility] services. [facility] able to take for [facility]. No other CM needs identified    Final next level of care: [place] Barriers to Discharge: No Barriers Identified   Patient Goals and CMS Choice Patient states their goals for this hospitalization and ongoing recovery are:: TO [place]      Discharge Placement                       Discharge Plan and Services                          HH Arranged: PT, OT [place] Agency: [place] Date [place]: 02/06/20 Time [place] Agency Contacted: [phone] Representative spoke with at [place]: [place] ([place]) Interventions     Readmission Risk Interventions No flowsheet data found.

## 2020-02-07 LAB — COMPREHENSIVE METABOLIC PANEL
ALT: 57 U/L — ABNORMAL HIGH (ref 0–44)
AST: 71 U/L — ABNORMAL HIGH (ref 15–41)
Albumin: 3.1 g/dL — ABNORMAL LOW (ref 3.5–5.0)
Alkaline Phosphatase: 59 U/L (ref 38–126)
Anion gap: 10 (ref 5–15)
BUN: 9 mg/dL (ref 8–23)
CO2: 25 mmol/L (ref 22–32)
Calcium: 9.1 mg/dL (ref 8.9–10.3)
Chloride: 104 mmol/L (ref 98–111)
Creatinine, Ser: 0.98 mg/dL (ref 0.61–1.24)
GFR calc Af Amer: >60 mL/min (ref >60)
GFR calc non Af Amer: >60 mL/min (ref >60)
Glucose, Bld: 122 mg/dL — ABNORMAL HIGH (ref 70–99)
Potassium: 3.9 mmol/L (ref 3.5–5.1)
Sodium: 139 mmol/L (ref 135–145)
Total Bilirubin: 0.6 mg/dL (ref 0.3–1.2)
Total Protein: 6.5 g/dL (ref 6.5–8.1)

## 2020-02-07 LAB — CBC
HCT: 33.5 % — ABNORMAL LOW (ref 39.0–52.0)
Hemoglobin: 11.3 g/dL — ABNORMAL LOW (ref 13.0–17.0)
MCH: 30.3 pg (ref 26.0–34.0)
MCHC: 33.7 g/dL (ref 30.0–36.0)
MCV: 89.8 fL (ref 80.0–100.0)
Platelets: 179 10*3/uL (ref 150–400)
RBC: 3.73 MIL/uL — ABNORMAL LOW (ref 4.22–5.81)
RDW: 12.5 % (ref 11.5–15.5)
WBC: 4.4 10*3/uL (ref 4.0–10.5)
nRBC: 0 % (ref 0.0–0.2)

## 2020-02-07 LAB — ACTH STIMULATION, 3 TIME POINTS
Cortisol, 30 Min: 24.3 ug/dL
Cortisol, 60 Min: 28.5 ug/dL
Cortisol, Base: 12.6 ug/dL

## 2020-02-07 LAB — GLUCOSE, CAPILLARY: Glucose-Capillary: 102 mg/dL — ABNORMAL HIGH (ref 70–99)

## 2020-02-07 LAB — C-REACTIVE PROTEIN: CRP: 3.5 mg/dL — ABNORMAL HIGH (ref <1.0)

## 2020-02-07 LAB — D-DIMER, QUANTITATIVE: D-Dimer, Quant: 1.58 ug/mL-FEU — ABNORMAL HIGH (ref 0.00–0.50)

## 2020-02-07 MED ORDER — DILTIAZEM HCL ER COATED BEADS 240 MG PO CP24
240.0000 mg | ORAL_CAPSULE | Freq: Every day | ORAL | 1 refills | Status: DC
Start: 2020-02-08 — End: 2020-02-29

## 2020-02-07 MED ORDER — TAMSULOSIN HCL 0.4 MG PO CAPS: 0.4000 mg | ORAL_CAPSULE | Freq: Every day | ORAL | 0 refills | Status: AC

## 2020-02-07 MED ORDER — DEXAMETHASONE 6 MG PO TABS
6.0000 mg | ORAL_TABLET | Freq: Every day | ORAL | 0 refills | Status: DC
Start: 2020-02-08 — End: 2020-02-29

## 2020-02-07 MED ORDER — PANTOPRAZOLE SODIUM 40 MG PO TBEC: 40.0000 mg | DELAYED_RELEASE_TABLET | Freq: Every day | ORAL | 0 refills | Status: DC

## 2020-02-07 MED ORDER — DEXAMETHASONE 6 MG PO TABS: 6.0000 mg | ORAL_TABLET | Freq: Every day | ORAL | Status: DC

## 2020-02-07 NOTE — Progress Notes (Signed)
Spoke with phlebotomy around 430 about morning lab draw. Notified someone would come around 6 am. Called phlebotomy around 615 am. Notified someone would come draw lab this morning.

## 2020-02-07 NOTE — Progress Notes (Signed)
Pt discharged home.  Discharge instructions explained.  Pt verbalizes understanding.

## 2020-02-07 NOTE — Discharge Summary (Signed)
Kevin Merritt, is a 69 y.o. male  DOB 1951-01-08  MRN [id number].  Admission date:  02/03/2020  Admitting Physician  Mckinley Jewel, MD  Discharge Date:  02/07/2020   Primary MD  [name], MD  Recommendations for primary care physician for things to follow:  -Check CBC, CMP during next visit. -Adjust antihypertensive regimen as needed, it appears to be currently acceptable on current regimen of Cardizem CD 240 mg oral daily, and holding rest of his medications. -Patient to follow with cardiology as an outpatient regarding diagnosis of new A. fib which has resolved, no anticoagulation initiated, please see discussion below. -Patient received monoclonal antibody for COVID-19 for pneumonia, he will be discharged to continue with remdesivir clinic as an outpatient. -Patient will need outpatient follow-up/work-up for thyroid nodularity, and ascending aortic aneurysm.  Admission Diagnosis  Syncope [R55] Orthostasis [I95.1] AKI (acute kidney injury) ([place]) [N17.9] Atrial fibrillation with RVR ([place]) [I48.91] Syncope, unspecified syncope type [R55]   Discharge Diagnosis  Syncope [R55] Orthostasis [I95.1] AKI (acute kidney injury) ([place]) [N17.9] Atrial fibrillation with RVR ([place]) [I48.91] Syncope, unspecified syncope type [R55]    Principal Problem:   Syncope Active Problems:   Essential hypertension   HLD (hyperlipidemia)   Atrial fibrillation with RVR ([facility])   AKI (acute kidney injury) ([place])   COVID-19      Past Medical History:  Diagnosis Date  . CKD (chronic kidney disease), stage III   . High cholesterol   . Hypertension   . Prostate cancer [facility])     Past Surgical History:  Procedure Laterality Date  . COLONOSCOPY W/ POLYPECTOMY  2012  . HERNIA REPAIR Right    Age 69  . NM MYOVIEW LTD  10/15/2013   7 min, 8.5 METs, 57%, ASx ST depressions - No ischemia / infarction.  . THYROIDECTOMY      Age 21  . TONSILLECTOMY     Age 17  . TRANSTHORACIC ECHOCARDIOGRAM  10/28/2013   Nl LV Size & function; EF 65-70%, mod Conc LVH with Gd 2 DD       History of present illness and  Hospital Course:     Kindly see H&P for history of present illness and admission details, please review complete Labs, Consult reports and Test reports for all details in brief  HPI  from the history and physical done on the day of admission 02/03/2020   HPI: Kevin Merritt is a 69 y.o. male with medical history significant of hypertension, hyperlipidemia, prostate cancer-on IV testosterone injection every 80-month, morbid obesity presents to emergency department for evaluation of syncope.  Patient tells me that he got up this morning to go to bathroom, initially he felt dizzy.  He went to bathroom and when he stood up and suddenly he blacked out and fell on the floor.  He denies head trauma, seizures, headache, blurry vision, nausea, vomiting, chest pain, palpitation or leg swelling.  Reports exertional shortness of breath however denies orthopnea, PND, history of CHF, fever, chills, cough, congestion, bowel changes.  He  has chronic urinary symptoms due to history of prostate cancer.  He lives with his wife at home.  No history of smoking, alcohol, listed drug use.  He is not vaccinated against COVID-19 and have not decided to get 1.  No history of recent travel, history of PE/DVT.  ED Course: Upon arrival to ED: Patient tachycardic, tachypneic, CBC shows leukopenia of 3.9, CMP shows AKI on CKD stage III, COVID-19 came back positive.  UA, UDS, ethanol, magnesium level: WNL, D-dimer: 2.20.  CT angio of chest came back negative for PE.  EKG shows A. fib.  Patient started on Cardizem drip and received IV fluid in ED as orthostatic vitals were positive.  [facility] hospitalist consulted for admission due to syncope and new onset A. Fib.   Hospital Course    Syncope: -Related to orthostatic hypotension, this has  improved with IV fluids, no further orthostasis at this point -NO Significant events on telemetry . -No significant stenosis on carotid Dopplers . -Will change Flomax to evening , he was instructed to keep wearing TED hose thigh-high. -Szo level was checked, and it was low couple times, so cosyntropin stimulation test has been obtained, and came back reassuring within normal range, so no evidence of adrenal insufficiency.  Atrial fibrillation -He did convert to normal sinus rhythm, he remains on Cardizem p.o. been consolidated to CD form -Management per cardiology, this is most likely related to acute illness, no recommendation for long-term anticoagulation, they will arrange for Holter monitor as an outpatient,  on heparin GTT which has been stopped, no anticoagulation has been initiated as per cardiology recommendation, he is to continue his home dose aspirin, and to follow with cardiology as an outpatient, already appointment has been scheduled with Dr. Percival Spanish in November .  Hypokalemia -Repleted.  Hypertension -Blood pressure better controlled on current dose of Cardizem CD, . -continue with Cardizem CD, stopped amlodipine, losartan-HCTZ on discharge  COVID-19 infection/pneumonia -This is incidental finding on admission, as he appears to be an asymptomatic as as it does appear early in the course of the disease on admission, but it does appear to be progressive, initial x-ray with no pneumonia, repeat x-ray 9/25 with developing pneumonia, so he was initiated on remdesivir, received 2 doses during hospital stay, to finish another 3 days outpatient and Remdesivir clinic. -He is started on Decadron, to finish total of 10 days treatment. -Received monoclonal antibody on admission. -Have discussed with patient and his wife, informed he is early in his Covid infection, to to monitor closely for any respiratory symptoms, or dyspnea, and to come to ED if he develops any of the  symptoms.  Severe LVH:  -Due to hypertension, work-up as an outpatient per cardiology .  Ascending aorta aneurysm: Noted on CT chest. 4.3 cm -Follow-up outpatient  Thyroid nodularity:  - Noted on CT chest.  TSH WNL,Follow-up outpatient.  Hyperlipidemia: Continue statin  AKI on CKD stage III: -Creatinine: 1.69, GFR: 46 -Received IV fluidsin ED. Hold nephrotoxic medication valsartan-HCTZ for now.  Proved with IV fluids during hospital stay.  History of prostate cancer:  -He is followed as an outpatient by Dr. Gloriann Loan from [facility] urology. -Patient with BPH, he was started recently on Flomax, dose was moved to evening time given orthostasis.    Discharge Condition:  stable   Follow UP   Follow-up Information    [name], MD Follow up.   Specialty: Internal Medicine Contact information: [address] [place] [address] [place] [address] [phone]  Minus Breeding, MD Follow up.   Specialty: Cardiology Why: Hospital follow-up scheduled for 03/18/2020 at 9:00am. Please arrive 15 minutes early for check-in. If this date/time does not work for you, please call our office to reschedule. Our office will also contact you to help arrange heart monitor. Contact information: [address] [place] [address] [phone]                 Discharge Instructions  and  Discharge Medications     Discharge Instructions    Amb referral to AFIB Clinic   Complete by: As directed    Discharge instructions   Complete by: As directed    Follow with Primary MD [name], MD in 14 days   Get CBC, CMP, 2 view Chest X ray checked  by Primary MD next visit.    Activity: As tolerated with Full fall precautions use walker/cane & assistance as needed   Disposition Home    Diet: Heart Healthy , with feeding assistance and aspiration precautions.  On your next visit with your primary care physician please Get Medicines reviewed and  adjusted.   Please request your Prim.MD to go over all Hospital Tests and Procedure/Radiological results at the follow up, please get all Hospital records sent to your Prim MD by signing hospital release before you go home.   If you experience worsening of your admission symptoms, develop shortness of breath, life threatening emergency, suicidal or homicidal thoughts you must seek medical attention immediately by calling 911 or calling your MD immediately  if symptoms less severe.  You Must read complete instructions/literature along with all the possible adverse reactions/side effects for all the Medicines you take and that have been prescribed to you. Take any new Medicines after you have completely understood and accpet all the possible adverse reactions/side effects.   Do not drive, operating heavy machinery, perform activities at heights, swimming or participation in water activities or provide baby sitting services if your were admitted for syncope or siezures until you have seen by Primary MD or a Neurologist and advised to do so again.  Do not drive when taking Pain medications.    Do not take more than prescribed Pain, Sleep and Anxiety Medications  Special Instructions: If you have smoked or chewed Tobacco  in the last 2 yrs please stop smoking, stop any regular Alcohol  and or any Recreational drug use.  Wear Seat belts while driving.   Please note  You were cared for by a hospitalist during your hospital stay. If you have any questions about your discharge medications or the care you received while you were in the hospital after you are discharged, you can call the unit and asked to speak with the hospitalist on call if the hospitalist that took care of you is not available. Once you are discharged, your primary care physician will handle any further medical issues. Please note that NO REFILLS for any discharge medications will be authorized once you are discharged, as it is  imperative that you return to your primary care physician (or establish a relationship with a primary care physician if you do not have one) for your aftercare needs so that they can reassess your need for medications and monitor your lab values.   Increase activity slowly   Complete by: As directed      Allergies as of 02/07/2020      Reactions   Shellfish Allergy Nausea And Vomiting      Medication  List    STOP taking these medications   amLODipine 10 MG tablet Commonly known as: NORVASC   valsartan-hydrochlorothiazide 320-25 MG tablet Commonly known as: DIOVAN-HCT     TAKE these medications   aspirin EC 81 MG tablet Take 81 mg by mouth daily.   atorvastatin 20 MG tablet Commonly known as: LIPITOR Take 20 mg by mouth daily.   dexamethasone 6 MG tablet Commonly known as: DECADRON Take 1 tablet (6 mg total) by mouth daily. Start taking on: February 08, 2020   diltiazem 240 MG 24 hr capsule Commonly known as: CARDIZEM CD Take 1 capsule (240 mg total) by mouth daily. Start taking on: February 08, 2020   pantoprazole 40 MG tablet Commonly known as: Protonix Take 1 tablet (40 mg total) by mouth daily.   tamsulosin 0.4 MG Caps capsule Commonly known as: FLOMAX Take 1 capsule (0.4 mg total) by mouth daily after supper. What changed: when to take this         Diet and Activity recommendation: See Discharge Instructions above   Consults obtained -  Cardiolgoy   Major procedures and Radiology Reports - PLEASE review detailed and final reports for all details, in brief -      CT Head Wo Contrast  Result Date: 02/03/2020 CLINICAL DATA:  Syncope with minor head trauma EXAM: CT HEAD WITHOUT CONTRAST TECHNIQUE: Contiguous axial images were obtained from the base of the skull through the vertex without intravenous contrast. COMPARISON:  None. FINDINGS: Brain: No evidence of acute infarction, hemorrhage, hydrocephalus, extra-axial collection or mass lesion/mass  effect. Vascular: No hyperdense vessel or unexpected calcification. Skull: Normal. Negative for fracture or focal lesion. Sinuses/Orbits: Partially covered right maxillary sinus which is essentially completely opacified where seen. There is internal high and low-density components likely from mucosal edema and trapped secretions. IMPRESSION: 1. No evidence of intracranial injury. 2. Essentially complete opacification of the right maxillary sinus that is new from a 2010 neck CT Electronically Signed   By: Monte Fantasia M.D.   On: 02/03/2020 04:59   CT Angio Chest PE W and/or Wo Contrast  Result Date: 02/03/2020 CLINICAL DATA:  Syncope, tachycardia, dizziness, elevated D-dimer EXAM: CT ANGIOGRAPHY CHEST WITH CONTRAST TECHNIQUE: Multidetector CT imaging of the chest was performed using the standard protocol during bolus administration of intravenous contrast. Multiplanar CT image reconstructions and MIPs were obtained to evaluate the vascular anatomy. CONTRAST:  82mL OMNIPAQUE IOHEXOL 350 MG/ML SOLN COMPARISON:  None. FINDINGS: Cardiovascular: There is excellent opacification of the pulmonary arterial tree and there is no intraluminal filling defect identified to suggest acute pulmonary embolism. The central pulmonary arteries are enlarged in keeping with changes of pulmonary arterial hypertension. There is mild coronary artery calcification. There is mild global cardiomegaly with severe left ventricular hypertrophy. There is mild aneurysmal dilation of the a thoracic aorta which measures 4.3 cm in greatest dimension in its ascending segment. The aorta assumes a more normal caliber in its descending segment measuring 3.2 cm just beyond the takeoff of the left subclavian artery and 2.9 cm at the level of the left atrium. Mediastinum/Nodes: There is nodular enlargement of the right thyroid gland, not well assessed on this examination and new since prior neck CT examination of 04/29/2009. No pathologic thoracic  adenopathy. The esophagus is unremarkable. Lungs/Pleura: Mild bibasilar atelectasis. There is mild thickening of the peribronchovascular interstitium likely representing trace bilateral perihilar pulmonary edema. No confluent pulmonary infiltrate. No pneumothorax or pleural effusion. The central airways are widely patent. Upper Abdomen: Unremarkable  Musculoskeletal: No acute bone abnormality. Review of the MIP images confirms the above findings. IMPRESSION: No pulmonary embolism. Severe left ventricular hypertrophy. Pulmonary arterial hypertension, possibly the result of diastolic dysfunction,. This could be better assessed with echocardiography, if indicated. Trace perihilar pulmonary edema, likely cardiogenic in nature. 4.3 cm ascending aortic aneurysm. Right thyroid nodularity, poorly assessed on this examination. This could be better assessed with dedicated thyroid sonography once the patient's acute issues have resolved, if clinically indicated. Aortic Atherosclerosis (ICD10-I70.0). Aortic aneurysm NOS (ICD10-I71.9). Electronically Signed   By: Fidela Salisbury MD   On: 02/03/2020 07:02   DG Chest Port 1 View  Result Date: 02/06/2020 CLINICAL DATA:  History of COVID-19. EXAM: PORTABLE CHEST 1 VIEW COMPARISON:  Chest radiograph 09/16/2013. FINDINGS: Monitoring leads overlie the patient. Cardiac contours upper limits of normal. Mild bilateral peripheral airspace opacities. No pleural effusion or pneumothorax. Thoracic spine degenerative changes. IMPRESSION: Mild bilateral peripheral airspace opacities may represent atypical infectious process. Electronically Signed   By: Lovey Newcomer M.D.   On: 02/06/2020 09:44   VAS US CAROTID  Result Date: 02/04/2020 Carotid Arterial Duplex Study Indications:       Syncope. Risk Factors:      Hypertension, hyperlipidemia. Comparison Study:  no prior Performing Technologist: Abram Sander RVS  Examination Guidelines: A complete evaluation includes B-mode imaging, spectral  Doppler, color Doppler, and power Doppler as needed of all accessible portions of each vessel. Bilateral testing is considered an integral part of a complete examination. Limited examinations for reoccurring indications may be performed as noted.  Right Carotid Findings: +----------+--------+--------+--------+------------------+--------+           PSV cm/sEDV cm/sStenosisPlaque DescriptionComments +----------+--------+--------+--------+------------------+--------+ CCA Prox  102     7               heterogenous               +----------+--------+--------+--------+------------------+--------+ CCA Distal87      14              heterogenous               +----------+--------+--------+--------+------------------+--------+ ICA Prox  80      22      1-39%   heterogenous               +----------+--------+--------+--------+------------------+--------+ ICA Distal115     32                                         +----------+--------+--------+--------+------------------+--------+ ECA       75                                                 +----------+--------+--------+--------+------------------+--------+ +----------+--------+-------+--------+-------------------+           PSV cm/sEDV cmsDescribeArm Pressure (mmHG) +----------+--------+-------+--------+-------------------+ [id number]                                        +----------+--------+-------+--------+-------------------+ +---------+--------+--+--------+--+---------+ VertebralPSV cm/s58EDV cm/s12Antegrade +---------+--------+--+--------+--+---------+  Left Carotid Findings: +----------+--------+--------+--------+------------------+--------+           PSV cm/sEDV cm/sStenosisPlaque DescriptionComments +----------+--------+--------+--------+------------------+--------+ CCA Prox  142     16  heterogenous                +----------+--------+--------+--------+------------------+--------+ CCA Distal104     17              heterogenous               +----------+--------+--------+--------+------------------+--------+ ICA Prox  65      18      1-39%   heterogenous               +----------+--------+--------+--------+------------------+--------+ ICA Distal84      22                                         +----------+--------+--------+--------+------------------+--------+ ECA       105                                                +----------+--------+--------+--------+------------------+--------+ +----------+--------+--------+--------+-------------------+           PSV cm/sEDV cm/sDescribeArm Pressure (mmHG) +----------+--------+--------+--------+-------------------+ Subclavian127                                         +----------+--------+--------+--------+-------------------+ +---------+--------+--+--------+--+---------+ VertebralPSV cm/s51EDV cm/s14Antegrade +---------+--------+--+--------+--+---------+   Summary: Right Carotid: Velocities in the right ICA are consistent with a 1-39% stenosis. Left Carotid: Velocities in the left ICA are consistent with a 1-39% stenosis. Vertebrals: Bilateral vertebral arteries demonstrate antegrade flow. *See table(s) above for measurements and observations.  Electronically signed by Servando Snare MD on 02/04/2020 at 7:10:03 PM.    Final    ECHOCARDIOGRAM LIMITED  Result Date: 02/03/2020    ECHOCARDIOGRAM LIMITED REPORT   Patient Name:   DAMARIO GILLIE Date of Exam: 02/03/2020 Medical Rec #:  [id number]       Height:       77.0 in Accession #:    [id number]      Weight:       247.6 lb Date of Birth:  1950-10-17       BSA:          2.449 m Patient Age:    33 years        BP:           143/69 mmHg Patient Gender: M               HR:           79 bpm. Exam Location:  Inpatient Procedure: Limited Echo, Cardiac Doppler and Color Doppler Indications:     Elevated Troponin  History:        Patient has prior history of Echocardiogram examinations, most                 recent 10/28/2013. Arrythmias:Atrial Fibrillation; Risk                 Factors:Hypertension, Dyslipidemia and Obesity. COVID+, CKD.  Sonographer:    Dustin Flock Referring Phys: [id number] [place]  1. Left ventricular ejection fraction, by estimation, is 60 to 65%. The left ventricle has normal function. The left ventricle has no regional wall motion abnormalities. There is moderate concentric left ventricular hypertrophy. Left ventricular diastolic parameters are consistent with Grade II diastolic dysfunction (  pseudonormalization).  2. Right ventricular systolic function is normal. The right ventricular size is normal.  3. Trivial mitral valve regurgitation.  4. There is mild calcification of the aortic valve. There is mild thickening of the aortic valve. Aortic valve regurgitation is mild. Mild aortic valve sclerosis is present, with no evidence of aortic valve stenosis. Comparison(s): No prior Echocardiogram. FINDINGS  Left Ventricle: Left ventricular ejection fraction, by estimation, is 60 to 65%. The left ventricle has normal function. The left ventricle has no regional wall motion abnormalities. There is moderate concentric left ventricular hypertrophy. Left ventricular diastolic parameters are consistent with Grade II diastolic dysfunction (pseudonormalization). Right Ventricle: The right ventricular size is normal. Right ventricular systolic function is normal. Pericardium: There is no evidence of pericardial effusion. Mitral Valve: There is mild thickening of the mitral valve leaflet(s). Mild mitral annular calcification. Trivial mitral valve regurgitation. Tricuspid Valve: The tricuspid valve is normal in structure. Tricuspid valve regurgitation is not demonstrated. Aortic Valve: There is mild calcification of the aortic valve. There is mild thickening of the aortic valve.  Aortic valve regurgitation is mild. Aortic regurgitation PHT measures 476 msec. Mild aortic valve sclerosis is present, with no evidence of aortic valve stenosis. Pulmonic Valve: The pulmonic valve was not assessed. Aorta: The aortic root is normal in size and structure. LEFT VENTRICLE PLAX 2D LVIDd:         4.00 cm  Diastology LVIDs:         2.70 cm  LV e' medial:    4.13 cm/s LV PW:         1.70 cm  LV E/e' medial:  10.7 LV IVS:        1.70 cm  LV e' lateral:   6.09 cm/s LVOT diam:     2.30 cm  LV E/e' lateral: 7.2 LVOT Area:     4.15 cm  RIGHT VENTRICLE RV S prime:     5.00 cm/s LEFT ATRIUM         Index LA diam:    3.60 cm 1.47 cm/m  AORTIC VALVE AI PHT:      476 msec  AORTA Ao Root diam: 3.60 cm MITRAL VALVE MV Area (PHT): 4.80 cm    SHUNTS MV Decel Time: 158 msec    Systemic Diam: 2.30 cm MV E velocity: 44.10 cm/s MV A velocity: 43.30 cm/s MV E/A ratio:  1.02 Gwyndolyn Kaufman MD Electronically signed by Gwyndolyn Kaufman MD Signature Date/Time: 02/03/2020/3:29:33 PM    Final     Micro Results    Recent Results (from the past 240 hour(s))  SARS Coronavirus 2 by RT PCR (hospital order, performed in [facility] hospital lab) Nasopharyngeal Nasopharyngeal Swab     Status: Abnormal   Collection Time: 02/03/20  5:51 AM   Specimen: Nasopharyngeal Swab  Result Value Ref Range Status   SARS Coronavirus 2 POSITIVE (A) NEGATIVE Final    Comment: emailed L. Berdik RN 8:30 02/03/20 ([email or web address]) (NOTE) SARS-CoV-2 target nucleic acids are DETECTED  SARS-CoV-2 RNA is generally detectable in upper respiratory specimens  during the acute phase of infection.  Positive results are indicative  of the presence of the identified virus, but do not rule out bacterial infection or co-infection with other pathogens not detected by the test.  Clinical correlation with patient history and  other diagnostic information is necessary to determine patient infection status.  The expected result is negative.  Fact Sheet  for Patients:   StrictlyIdeas.no   Fact Sheet for Healthcare Providers:  BankingDealers.co.za    This test is not yet approved or cleared by the [place] and  has been authorized for detection and/or diagnosis of SARS-CoV-2 by FDA under an Emergency Use Authorization (EUA).  This EUA will remain in effect (meaning this test can be used) for the duration of  the  COVID-19 declaration under Section 564(b)(1) of the Act, 21 U.S.C. section 360-bbb-3(b)(1), unless the authorization is terminated or revoked sooner.  Performed at [facility], [place] [address], [place], [place] [address]        Today   Subjective:   Kevin Merritt today has no headache,no chest abdominal pain,no new weakness tingling or numbness, feels much better wants to go home today.  He was ambulated in the hallway today, with no hypoxia lightheadedness or dizziness.  Objective:   Blood pressure (!) 149/80, pulse 84, temperature 98.8 F (37.1 C), temperature source Oral, resp. rate 17, height 6\' 5"  (1.956 m), weight 123.6 kg, SpO2 99 %.   Intake/Output Summary (Last 24 hours) at 02/07/2020 1339 Last data filed at 02/07/2020 1230 Gross per 24 hour  Intake 770 ml  Output 2200 ml  Net -1430 ml    Exam Awake Alert, Oriented x 3, No new F.N deficits, Normal affect Symmetrical Chest wall movement, Good air movement bilaterally, CTAB RRR,No Gallops,Rubs or new Murmurs, No Parasternal Heave +ve B.Sounds, Abd Soft, Non tender, No rebound -guarding or rigidity. No Cyanosis, Clubbing or edema, No new Rash or bruise  Data Review   CBC w Diff:  Lab Results  Component Value Date   WBC 4.4 02/07/2020   HGB 11.3 (L) 02/07/2020   HCT 33.5 (L) 02/07/2020   PLT 179 02/07/2020   LYMPHOPCT 36 02/03/2020   MONOPCT 9 02/03/2020   EOSPCT 0 02/03/2020   BASOPCT 0 02/03/2020    CMP:  Lab Results  Component Value Date   NA 139 02/07/2020   K 3.9  02/07/2020   CL 104 02/07/2020   CO2 25 02/07/2020   BUN 9 02/07/2020   CREATININE 0.98 02/07/2020   PROT 6.5 02/07/2020   ALBUMIN 3.1 (L) 02/07/2020   BILITOT 0.6 02/07/2020   ALKPHOS 59 02/07/2020   AST 71 (H) 02/07/2020   ALT 57 (H) 02/07/2020  .   Total Time in preparing paper work, data evaluation and todays exam - 51 minutes  Phillips Climes M.D on 02/07/2020 at 1:39 PM  [facility]   Office  [phone]

## 2020-02-07 NOTE — Care Management (Signed)
Requested [place] orders from MD. Kaylyn Layer of [place]

## 2020-02-07 NOTE — Discharge Instructions (Signed)
Patient scheduled for outpatient Remdesivir infusions at 3pm on Monday 9/27 at [facility]. Please inform the patient to park at [place], as staff will be escorting the patient through the [place] entrance of the hospital. Appointments take approximately 45 minutes.    There is a wave flag banner located near the entrance on [address]. Turn into this entrance and immediately turn left and park in 1 of the 5 designated Covid Infusion Parking spots. There is a phone number on the sign, please call and let the staff know what spot you are in and we will come out and get you. For questions call [phone].  Thanks.       Person Under Monitoring Name: Kevin Merritt  Location: [place] [address]   Infection Prevention Recommendations for Individuals Confirmed to have, or Being Evaluated for, 2019 Novel Coronavirus (COVID-19) Infection Who Receive Care at Home  Individuals who are confirmed to have, or are being evaluated for, COVID-19 should follow the prevention steps below until a healthcare provider or local or state health department says they can return to normal activities.  Stay home except to get medical care You should restrict activities outside your home, except for getting medical care. Do not go to work, school, or public areas, and do not use public transportation or taxis.  Call ahead before visiting your doctor Before your medical appointment, call the healthcare provider and tell them that you have, or are being evaluated for, COVID-19 infection. This will help the healthcare provider's office take steps to keep other people from getting infected. Ask your healthcare provider to call the local or state health department.  Monitor your symptoms Seek prompt medical attention if your illness is worsening (e.g., difficulty breathing). Before going to your medical appointment, call the healthcare provider and tell them that you have,  or are being evaluated for, COVID-19 infection. Ask your healthcare provider to call the local or state health department.  Wear a facemask You should wear a facemask that covers your nose and mouth when you are in the same room with other people and when you visit a healthcare provider. People who live with or visit you should also wear a facemask while they are in the same room with you.  Separate yourself from other people in your home As much as possible, you should stay in a different room from other people in your home. Also, you should use a separate bathroom, if available.  Avoid sharing household items You should not share dishes, drinking glasses, cups, eating utensils, towels, bedding, or other items with other people in your home. After using these items, you should wash them thoroughly with soap and water.  Cover your coughs and sneezes Cover your mouth and nose with a tissue when you cough or sneeze, or you can cough or sneeze into your sleeve. Throw used tissues in a lined trash can, and immediately wash your hands with soap and water for at least 20 seconds or use an alcohol-based hand rub.  Wash your [facility] your hands often and thoroughly with soap and water for at least 20 seconds. You can use an alcohol-based hand sanitizer if soap and water are not available and if your hands are not visibly dirty. Avoid touching your eyes, nose, and mouth with unwashed hands.   Prevention Steps for Caregivers and Household Members of Individuals Confirmed to have, or Being Evaluated for, COVID-19 Infection Being Cared for in  the Home  If you live with, or provide care at home for, a person confirmed to have, or being evaluated for, COVID-19 infection please follow these guidelines to prevent infection:  Follow healthcare provider's instructions Make sure that you understand and can help the patient follow any healthcare provider instructions for all care.  Provide for the  patient's basic needs You should help the patient with basic needs in the home and provide support for getting groceries, prescriptions, and other personal needs.  Monitor the patient's symptoms If they are getting sicker, call his or her medical provider and tell them that the patient has, or is being evaluated for, COVID-19 infection. This will help the healthcare provider's office take steps to keep other people from getting infected. Ask the healthcare provider to call the local or state health department.  Limit the number of people who have contact with the patient  If possible, have only one caregiver for the patient.  Other household members should stay in another home or place of residence. If this is not possible, they should stay  in another room, or be separated from the patient as much as possible. Use a separate bathroom, if available.  Restrict visitors who do not have an essential need to be in the home.  Keep older adults, very young children, and other sick people away from the patient Keep older adults, very young children, and those who have compromised immune systems or chronic health conditions away from the patient. This includes people with chronic heart, lung, or kidney conditions, diabetes, and cancer.  Ensure good ventilation Make sure that shared spaces in the home have good air flow, such as from an air conditioner or an opened window, weather permitting.  Wash your hands often  Wash your hands often and thoroughly with soap and water for at least 20 seconds. You can use an alcohol based hand sanitizer if soap and water are not available and if your hands are not visibly dirty.  Avoid touching your eyes, nose, and mouth with unwashed hands.  Use disposable paper towels to dry your hands. If not available, use dedicated cloth towels and replace them when they become wet.  Wear a facemask and gloves  Wear a disposable facemask at all times in the room  and gloves when you touch or have contact with the patient's blood, body fluids, and/or secretions or excretions, such as sweat, saliva, sputum, nasal mucus, vomit, urine, or feces.  Ensure the mask fits over your nose and mouth tightly, and do not touch it during use.  Throw out disposable facemasks and gloves after using them. Do not reuse.  Wash your hands immediately after removing your facemask and gloves.  If your personal clothing becomes contaminated, carefully remove clothing and launder. Wash your hands after handling contaminated clothing.  Place all used disposable facemasks, gloves, and other waste in a lined container before disposing them with other household waste.  Remove gloves and wash your hands immediately after handling these items.  Do not share dishes, glasses, or other household items with the patient  Avoid sharing household items. You should not share dishes, drinking glasses, cups, eating utensils, towels, bedding, or other items with a patient who is confirmed to have, or being evaluated for, COVID-19 infection.  After the person uses these items, you should wash them thoroughly with soap and water.  Wash laundry thoroughly  Immediately remove and wash clothes or bedding that have blood, body fluids, and/or secretions or excretions,  such as sweat, saliva, sputum, nasal mucus, vomit, urine, or feces, on them.  Wear gloves when handling laundry from the patient.  Read and follow directions on labels of laundry or clothing items and detergent. In general, wash and dry with the warmest temperatures recommended on the label.  Clean all areas the individual has used often  Clean all touchable surfaces, such as counters, tabletops, doorknobs, bathroom fixtures, toilets, phones, keyboards, tablets, and bedside tables, every day. Also, clean any surfaces that may have blood, body fluids, and/or secretions or excretions on them.  Wear gloves when cleaning surfaces the  patient has come in contact with.  Use a diluted bleach solution (e.g., dilute bleach with 1 part bleach and 10 parts water) or a household disinfectant with a label that says EPA-registered for coronaviruses. To make a bleach solution at home, add 1 tablespoon of bleach to 1 quart (4 cups) of water. For a larger supply, add  cup of bleach to 1 gallon (16 cups) of water.  Read labels of cleaning products and follow recommendations provided on product labels. Labels contain instructions for safe and effective use of the cleaning product including precautions you should take when applying the product, such as wearing gloves or eye protection and making sure you have good ventilation during use of the product.  Remove gloves and wash hands immediately after cleaning.  Monitor yourself for signs and symptoms of illness Caregivers and household members are considered close contacts, should monitor their health, and will be asked to limit movement outside of the home to the extent possible. Follow the monitoring steps for close contacts listed on the symptom monitoring form.   ? If you have additional questions, contact your local health department or call the epidemiologist on call at [phone] (available 24/7). ? This guidance is subject to change. For the most up-to-date guidance from [facility], please refer to their website: [email or web address]

## 2020-02-07 NOTE — Progress Notes (Addendum)
Patient scheduled for outpatient Remdesivir infusions at 3pm on Monday 9/27 at [facility]. Please inform the patient to park at [place], as staff will be escorting the patient through the [place] entrance of the hospital. Appointments take approximately 45 minutes.    There is a wave flag banner located near the entrance on [address]. Turn into this entrance and immediately turn left and park in 1 of the 5 designated Covid Infusion Parking spots. There is a phone number on the sign, please call and let the staff know what spot you are in and we will come out and get you. For questions call [phone].  Thanks.

## 2020-02-08 ENCOUNTER — Ambulatory Visit (HOSPITAL_COMMUNITY)
Admit: 2020-02-08 | Discharge: 2020-02-08 | Disposition: A | Discharge status: Home / Self Care | Payer: Medicare HMO | Attending: Pulmonary Disease | Admitting: Pulmonary Disease

## 2020-02-08 MED ORDER — EPINEPHRINE 0.3 MG/0.3ML IJ SOAJ: 0.3000 mg | Freq: Once | INTRAMUSCULAR | Status: DC | PRN

## 2020-02-08 MED ORDER — SODIUM CHLORIDE 0.9 % IV SOLN
100.0000 mg | Freq: Once | INTRAVENOUS | Status: AC
  Administered 2020-02-08: 100 mg via INTRAVENOUS
  Filled 2020-02-08: qty 20

## 2020-02-08 MED ORDER — METHYLPREDNISOLONE SODIUM SUCC 125 MG IJ SOLR: 125.0000 mg | Freq: Once | INTRAMUSCULAR | Status: DC | PRN

## 2020-02-08 MED ORDER — FAMOTIDINE IN NACL 20-0.9 MG/50ML-% IV SOLN: 20.0000 mg | Freq: Once | INTRAVENOUS | Status: DC | PRN

## 2020-02-08 MED ORDER — SODIUM CHLORIDE 0.9 % IV SOLN: INTRAVENOUS | Status: DC | PRN

## 2020-02-08 MED ORDER — ALBUTEROL SULFATE HFA 108 (90 BASE) MCG/ACT IN AERS: 2.0000 | INHALATION_SPRAY | Freq: Once | RESPIRATORY_TRACT | Status: DC | PRN

## 2020-02-08 MED ORDER — DIPHENHYDRAMINE HCL 50 MG/ML IJ SOLN: 50.0000 mg | Freq: Once | INTRAMUSCULAR | Status: DC | PRN

## 2020-02-08 NOTE — Progress Notes (Signed)
  Diagnosis: COVID-19  Physician: Dr. Joya Gaskins  Procedure: Covid Infusion Clinic Med: remdesivir infusion - Provided patient with remdesivir fact sheet for patients, parents and caregivers prior to infusion.  Complications: No immediate complications noted.  Discharge: Discharged home   [place] 02/08/2020

## 2020-02-09 ENCOUNTER — Ambulatory Visit (HOSPITAL_COMMUNITY)
Admission: RE | Admit: 2020-02-09 | Discharge: 2020-02-09 | Disposition: A | Discharge status: Home / Self Care | Payer: Medicare HMO | Source: Ambulatory Visit | Attending: Pulmonary Disease | Admitting: Pulmonary Disease

## 2020-02-09 DIAGNOSIS — J1282 Pneumonia due to coronavirus disease 2019: Secondary | ICD-10-CM | POA: Insufficient documentation

## 2020-02-09 DIAGNOSIS — U071 COVID-19: Secondary | ICD-10-CM | POA: Insufficient documentation

## 2020-02-09 MED ORDER — SODIUM CHLORIDE 0.9 % IV SOLN
100.0000 mg | Freq: Once | INTRAVENOUS | Status: AC
  Administered 2020-02-09: 100 mg via INTRAVENOUS
  Filled 2020-02-09: qty 20

## 2020-02-09 MED ORDER — DIPHENHYDRAMINE HCL 50 MG/ML IJ SOLN: 50.0000 mg | Freq: Once | INTRAMUSCULAR | Status: DC | PRN

## 2020-02-09 MED ORDER — EPINEPHRINE 0.3 MG/0.3ML IJ SOAJ: 0.3000 mg | Freq: Once | INTRAMUSCULAR | Status: DC | PRN

## 2020-02-09 MED ORDER — FAMOTIDINE IN NACL 20-0.9 MG/50ML-% IV SOLN: 20.0000 mg | Freq: Once | INTRAVENOUS | Status: DC | PRN

## 2020-02-09 MED ORDER — ALBUTEROL SULFATE HFA 108 (90 BASE) MCG/ACT IN AERS: 2.0000 | INHALATION_SPRAY | Freq: Once | RESPIRATORY_TRACT | Status: DC | PRN

## 2020-02-09 MED ORDER — METHYLPREDNISOLONE SODIUM SUCC 125 MG IJ SOLR: 125.0000 mg | Freq: Once | INTRAMUSCULAR | Status: DC | PRN

## 2020-02-09 MED ORDER — SODIUM CHLORIDE 0.9 % IV SOLN: INTRAVENOUS | Status: DC | PRN

## 2020-02-09 NOTE — Progress Notes (Signed)
  Diagnosis: COVID-19  Physician: Dr. Asencion Noble  Procedure: Covid Infusion Clinic Med: remdesivir infusion - Provided patient with remdesivir fact sheet for patients, parents and caregivers prior to infusion.  Complications: No immediate complications noted.  Discharge: Discharged home   Kevin Merritt 02/09/2020

## 2020-02-09 NOTE — Discharge Instructions (Signed)
10 Things You Can Do to Manage Your COVID-19 Symptoms at Home If you have possible or confirmed COVID-19: 1. Stay home from work and school. And stay away from other public places. If you must go out, avoid using any kind of public transportation, ridesharing, or taxis. 2. Monitor your symptoms carefully. If your symptoms get worse, call your healthcare provider immediately. 3. Get rest and stay hydrated. 4. If you have a medical appointment, call the healthcare provider ahead of time and tell them that you have or may have COVID-19. 5. For medical emergencies, call 911 and notify the dispatch personnel that you have or may have COVID-19. 6. Cover your cough and sneezes with a tissue or use the inside of your elbow. 7. Wash your hands often with soap and water for at least 20 seconds or clean your hands with an alcohol-based hand sanitizer that contains at least 60% alcohol. 8. As much as possible, stay in a specific room and away from other people in your home. Also, you should use a separate bathroom, if available. If you need to be around other people in or outside of the home, wear a mask. 9. Avoid sharing personal items with other people in your household, like dishes, towels, and bedding. 10. Clean all surfaces that are touched often, like counters, tabletops, and doorknobs. Use household cleaning sprays or wipes according to the label instructions. cdc.gov/coronavirus 11/12/2018 This information is not intended to replace advice given to you by your health care provider. Make sure you discuss any questions you have with your health care provider. Document Revised: 04/16/2019 Document Reviewed: 04/16/2019 Elsevier Patient Education  2020 Elsevier Inc.  

## 2020-02-10 ENCOUNTER — Ambulatory Visit (HOSPITAL_COMMUNITY)
Admit: 2020-02-10 | Discharge: 2020-02-10 | Disposition: A | Discharge status: Home / Self Care | Payer: Medicare HMO | Source: Ambulatory Visit | Attending: Pulmonary Disease | Admitting: Pulmonary Disease

## 2020-02-10 DIAGNOSIS — J1282 Pneumonia due to coronavirus disease 2019: Secondary | ICD-10-CM | POA: Insufficient documentation

## 2020-02-10 DIAGNOSIS — N183 Chronic kidney disease, stage 3 unspecified: Secondary | ICD-10-CM | POA: Diagnosis not present

## 2020-02-10 DIAGNOSIS — E785 Hyperlipidemia, unspecified: Secondary | ICD-10-CM | POA: Diagnosis not present

## 2020-02-10 DIAGNOSIS — U071 COVID-19: Secondary | ICD-10-CM | POA: Diagnosis not present

## 2020-02-10 DIAGNOSIS — E7800 Pure hypercholesterolemia, unspecified: Secondary | ICD-10-CM | POA: Diagnosis not present

## 2020-02-10 DIAGNOSIS — I4891 Unspecified atrial fibrillation: Secondary | ICD-10-CM | POA: Diagnosis not present

## 2020-02-10 DIAGNOSIS — I951 Orthostatic hypotension: Secondary | ICD-10-CM | POA: Diagnosis not present

## 2020-02-10 DIAGNOSIS — I1310 Hypertensive heart and chronic kidney disease without heart failure, with stage 1 through stage 4 chronic kidney disease, or unspecified chronic kidney disease: Secondary | ICD-10-CM | POA: Diagnosis not present

## 2020-02-10 DIAGNOSIS — M170 Bilateral primary osteoarthritis of knee: Secondary | ICD-10-CM | POA: Diagnosis not present

## 2020-02-10 MED ORDER — METHYLPREDNISOLONE SODIUM SUCC 125 MG IJ SOLR: 125.0000 mg | Freq: Once | INTRAMUSCULAR | Status: DC | PRN

## 2020-02-10 MED ORDER — SODIUM CHLORIDE 0.9 % IV SOLN
100.0000 mg | Freq: Once | INTRAVENOUS | Status: AC
  Administered 2020-02-10: 100 mg via INTRAVENOUS
  Filled 2020-02-10: qty 20

## 2020-02-10 MED ORDER — ALBUTEROL SULFATE HFA 108 (90 BASE) MCG/ACT IN AERS: 2.0000 | INHALATION_SPRAY | Freq: Once | RESPIRATORY_TRACT | Status: DC | PRN

## 2020-02-10 MED ORDER — FAMOTIDINE IN NACL 20-0.9 MG/50ML-% IV SOLN: 20.0000 mg | Freq: Once | INTRAVENOUS | Status: DC | PRN

## 2020-02-10 MED ORDER — SODIUM CHLORIDE 0.9 % IV SOLN: INTRAVENOUS | Status: DC | PRN

## 2020-02-10 MED ORDER — EPINEPHRINE 0.3 MG/0.3ML IJ SOAJ: 0.3000 mg | Freq: Once | INTRAMUSCULAR | Status: DC | PRN

## 2020-02-10 MED ORDER — DIPHENHYDRAMINE HCL 50 MG/ML IJ SOLN: 50.0000 mg | Freq: Once | INTRAMUSCULAR | Status: DC | PRN

## 2020-02-10 NOTE — Progress Notes (Signed)
°  Diagnosis: COVID-19  Physician: Dr Joya Gaskins  Procedure: Covid Infusion Clinic Med: remdesivir infusion - Provided patient with remdesivir fact sheet for patients, parents and caregivers prior to infusion.  Complications: No immediate complications noted.  Discharge: Discharged home   [place] 02/10/2020

## 2020-02-10 NOTE — Discharge Instructions (Signed)
10 Things You Can Do to Manage Your COVID-19 Symptoms at Home If you have possible or confirmed COVID-19: 1. Stay home from work and school. And stay away from other public places. If you must go out, avoid using any kind of public transportation, ridesharing, or taxis. 2. Monitor your symptoms carefully. If your symptoms get worse, call your healthcare provider immediately. 3. Get rest and stay hydrated. 4. If you have a medical appointment, call the healthcare provider ahead of time and tell them that you have or may have COVID-19. 5. For medical emergencies, call 911 and notify the dispatch personnel that you have or may have COVID-19. 6. Cover your cough and sneezes with a tissue or use the inside of your elbow. 7. Wash your hands often with soap and water for at least 20 seconds or clean your hands with an alcohol-based hand sanitizer that contains at least 60% alcohol. 8. As much as possible, stay in a specific room and away from other people in your home. Also, you should use a separate bathroom, if available. If you need to be around other people in or outside of the home, wear a mask. 9. Avoid sharing personal items with other people in your household, like dishes, towels, and bedding. 10. Clean all surfaces that are touched often, like counters, tabletops, and doorknobs. Use household cleaning sprays or wipes according to the label instructions. cdc.gov/coronavirus 11/12/2018 This information is not intended to replace advice given to you by your health care provider. Make sure you discuss any questions you have with your health care provider. Document Revised: 04/16/2019 Document Reviewed: 04/16/2019 Elsevier Patient Education  2020 Elsevier Inc.  

## 2020-02-12 DIAGNOSIS — U071 COVID-19: Secondary | ICD-10-CM | POA: Diagnosis not present

## 2020-02-12 DIAGNOSIS — E785 Hyperlipidemia, unspecified: Secondary | ICD-10-CM | POA: Diagnosis not present

## 2020-02-12 DIAGNOSIS — J1282 Pneumonia due to coronavirus disease 2019: Secondary | ICD-10-CM | POA: Diagnosis not present

## 2020-02-12 DIAGNOSIS — M170 Bilateral primary osteoarthritis of knee: Secondary | ICD-10-CM | POA: Diagnosis not present

## 2020-02-12 DIAGNOSIS — I951 Orthostatic hypotension: Secondary | ICD-10-CM | POA: Diagnosis not present

## 2020-02-12 DIAGNOSIS — N183 Chronic kidney disease, stage 3 unspecified: Secondary | ICD-10-CM | POA: Diagnosis not present

## 2020-02-12 DIAGNOSIS — I4891 Unspecified atrial fibrillation: Secondary | ICD-10-CM | POA: Diagnosis not present

## 2020-02-12 DIAGNOSIS — E7800 Pure hypercholesterolemia, unspecified: Secondary | ICD-10-CM | POA: Diagnosis not present

## 2020-02-12 DIAGNOSIS — I1310 Hypertensive heart and chronic kidney disease without heart failure, with stage 1 through stage 4 chronic kidney disease, or unspecified chronic kidney disease: Secondary | ICD-10-CM | POA: Diagnosis not present

## 2020-02-15 DIAGNOSIS — E7800 Pure hypercholesterolemia, unspecified: Secondary | ICD-10-CM | POA: Diagnosis not present

## 2020-02-15 DIAGNOSIS — J1282 Pneumonia due to coronavirus disease 2019: Secondary | ICD-10-CM | POA: Diagnosis not present

## 2020-02-15 DIAGNOSIS — N183 Chronic kidney disease, stage 3 unspecified: Secondary | ICD-10-CM | POA: Diagnosis not present

## 2020-02-15 DIAGNOSIS — E785 Hyperlipidemia, unspecified: Secondary | ICD-10-CM | POA: Diagnosis not present

## 2020-02-15 DIAGNOSIS — M170 Bilateral primary osteoarthritis of knee: Secondary | ICD-10-CM | POA: Diagnosis not present

## 2020-02-15 DIAGNOSIS — I1310 Hypertensive heart and chronic kidney disease without heart failure, with stage 1 through stage 4 chronic kidney disease, or unspecified chronic kidney disease: Secondary | ICD-10-CM | POA: Diagnosis not present

## 2020-02-15 DIAGNOSIS — I951 Orthostatic hypotension: Secondary | ICD-10-CM | POA: Diagnosis not present

## 2020-02-15 DIAGNOSIS — I4891 Unspecified atrial fibrillation: Secondary | ICD-10-CM | POA: Diagnosis not present

## 2020-02-15 DIAGNOSIS — U071 COVID-19: Secondary | ICD-10-CM | POA: Diagnosis not present

## 2020-02-17 DIAGNOSIS — I4891 Unspecified atrial fibrillation: Secondary | ICD-10-CM | POA: Diagnosis not present

## 2020-02-17 DIAGNOSIS — N183 Chronic kidney disease, stage 3 unspecified: Secondary | ICD-10-CM | POA: Diagnosis not present

## 2020-02-17 DIAGNOSIS — E785 Hyperlipidemia, unspecified: Secondary | ICD-10-CM | POA: Diagnosis not present

## 2020-02-17 DIAGNOSIS — I1310 Hypertensive heart and chronic kidney disease without heart failure, with stage 1 through stage 4 chronic kidney disease, or unspecified chronic kidney disease: Secondary | ICD-10-CM | POA: Diagnosis not present

## 2020-02-17 DIAGNOSIS — M170 Bilateral primary osteoarthritis of knee: Secondary | ICD-10-CM | POA: Diagnosis not present

## 2020-02-17 DIAGNOSIS — E7800 Pure hypercholesterolemia, unspecified: Secondary | ICD-10-CM | POA: Diagnosis not present

## 2020-02-17 DIAGNOSIS — U071 COVID-19: Secondary | ICD-10-CM | POA: Diagnosis not present

## 2020-02-17 DIAGNOSIS — I951 Orthostatic hypotension: Secondary | ICD-10-CM | POA: Diagnosis not present

## 2020-02-17 DIAGNOSIS — J1282 Pneumonia due to coronavirus disease 2019: Secondary | ICD-10-CM | POA: Diagnosis not present

## 2020-02-18 ENCOUNTER — Ambulatory Visit (INDEPENDENT_AMBULATORY_CARE_PROVIDER_SITE_OTHER): Payer: Medicare HMO

## 2020-02-18 DIAGNOSIS — I4891 Unspecified atrial fibrillation: Secondary | ICD-10-CM | POA: Diagnosis not present

## 2020-02-19 DIAGNOSIS — I951 Orthostatic hypotension: Secondary | ICD-10-CM | POA: Diagnosis not present

## 2020-02-19 DIAGNOSIS — J1282 Pneumonia due to coronavirus disease 2019: Secondary | ICD-10-CM | POA: Diagnosis not present

## 2020-02-19 DIAGNOSIS — I712 Thoracic aortic aneurysm, without rupture: Secondary | ICD-10-CM | POA: Diagnosis not present

## 2020-02-19 DIAGNOSIS — N179 Acute kidney failure, unspecified: Secondary | ICD-10-CM | POA: Diagnosis not present

## 2020-02-19 DIAGNOSIS — R55 Syncope and collapse: Secondary | ICD-10-CM | POA: Diagnosis not present

## 2020-02-19 DIAGNOSIS — U071 COVID-19: Secondary | ICD-10-CM | POA: Diagnosis not present

## 2020-02-19 DIAGNOSIS — M170 Bilateral primary osteoarthritis of knee: Secondary | ICD-10-CM | POA: Diagnosis not present

## 2020-02-19 DIAGNOSIS — I1310 Hypertensive heart and chronic kidney disease without heart failure, with stage 1 through stage 4 chronic kidney disease, or unspecified chronic kidney disease: Secondary | ICD-10-CM | POA: Diagnosis not present

## 2020-02-19 DIAGNOSIS — E041 Nontoxic single thyroid nodule: Secondary | ICD-10-CM | POA: Diagnosis not present

## 2020-02-19 DIAGNOSIS — N183 Chronic kidney disease, stage 3 unspecified: Secondary | ICD-10-CM | POA: Diagnosis not present

## 2020-02-19 DIAGNOSIS — I4891 Unspecified atrial fibrillation: Secondary | ICD-10-CM | POA: Diagnosis not present

## 2020-02-19 DIAGNOSIS — E7800 Pure hypercholesterolemia, unspecified: Secondary | ICD-10-CM | POA: Diagnosis not present

## 2020-02-19 DIAGNOSIS — C61 Malignant neoplasm of prostate: Secondary | ICD-10-CM | POA: Diagnosis not present

## 2020-02-19 DIAGNOSIS — Z8616 Personal history of COVID-19: Secondary | ICD-10-CM | POA: Diagnosis not present

## 2020-02-19 DIAGNOSIS — I10 Essential (primary) hypertension: Secondary | ICD-10-CM | POA: Diagnosis not present

## 2020-02-19 DIAGNOSIS — E785 Hyperlipidemia, unspecified: Secondary | ICD-10-CM | POA: Diagnosis not present

## 2020-02-23 ENCOUNTER — Telehealth: Payer: Self-pay | Admitting: Cardiology

## 2020-02-23 DIAGNOSIS — I951 Orthostatic hypotension: Secondary | ICD-10-CM | POA: Diagnosis not present

## 2020-02-23 DIAGNOSIS — I4891 Unspecified atrial fibrillation: Secondary | ICD-10-CM | POA: Diagnosis not present

## 2020-02-23 DIAGNOSIS — M170 Bilateral primary osteoarthritis of knee: Secondary | ICD-10-CM | POA: Diagnosis not present

## 2020-02-23 DIAGNOSIS — U071 COVID-19: Secondary | ICD-10-CM | POA: Diagnosis not present

## 2020-02-23 DIAGNOSIS — N183 Chronic kidney disease, stage 3 unspecified: Secondary | ICD-10-CM | POA: Diagnosis not present

## 2020-02-23 DIAGNOSIS — E7800 Pure hypercholesterolemia, unspecified: Secondary | ICD-10-CM | POA: Diagnosis not present

## 2020-02-23 DIAGNOSIS — E785 Hyperlipidemia, unspecified: Secondary | ICD-10-CM | POA: Diagnosis not present

## 2020-02-23 DIAGNOSIS — J1282 Pneumonia due to coronavirus disease 2019: Secondary | ICD-10-CM | POA: Diagnosis not present

## 2020-02-23 DIAGNOSIS — I1310 Hypertensive heart and chronic kidney disease without heart failure, with stage 1 through stage 4 chronic kidney disease, or unspecified chronic kidney disease: Secondary | ICD-10-CM | POA: Diagnosis not present

## 2020-02-23 NOTE — Telephone Encounter (Signed)
Please send a home stool guaiac card to make sure he has no GI bleeding since he has anemia.  He will need to be started on Eliquis but I think that he should have at least a remote visit with APP this week if possible to discuss with his wife in attendance if the patient would like.  (She asked many and good questions over the phone in the hospital when he was admitted.)

## 2020-02-23 NOTE — Telephone Encounter (Signed)
Received page from [employer] regarding abnormal EKG. Reported documented episode of Afib 80 bpm this morning around 6am for 60 second strip. This was auto triggered. Company and myself talked with the patient. He reported feeling like his heart was "out of rhythm" but otherwise was asymptomatic. He was not discharged on [facility], but had plans to monitor for recurrence as an outpatient. CHADsVasc of possibly 3 noted in the chart from prior admission. Will forward to PCP cardiologist and NL triage to follow up on strips and further management of the patient. Patient understood plan, was agreeable and thanked me for callback. ER precautions given.

## 2020-02-24 ENCOUNTER — Ambulatory Visit
Admission: RE | Admit: 2020-02-24 | Discharge: 2020-02-24 | Disposition: A | Discharge status: Home / Self Care | Payer: Medicare HMO | Source: Ambulatory Visit | Attending: Internal Medicine | Admitting: Internal Medicine

## 2020-02-24 ENCOUNTER — Other Ambulatory Visit: Payer: Self-pay | Admitting: Internal Medicine

## 2020-02-24 DIAGNOSIS — M533 Sacrococcygeal disorders, not elsewhere classified: Secondary | ICD-10-CM

## 2020-02-24 DIAGNOSIS — E042 Nontoxic multinodular goiter: Secondary | ICD-10-CM | POA: Diagnosis not present

## 2020-02-24 DIAGNOSIS — E041 Nontoxic single thyroid nodule: Secondary | ICD-10-CM | POA: Diagnosis not present

## 2020-02-24 DIAGNOSIS — M5136 Other intervertebral disc degeneration, lumbar region: Secondary | ICD-10-CM | POA: Diagnosis not present

## 2020-02-24 DIAGNOSIS — I499 Cardiac arrhythmia, unspecified: Secondary | ICD-10-CM | POA: Diagnosis not present

## 2020-02-24 DIAGNOSIS — Z8546 Personal history of malignant neoplasm of prostate: Secondary | ICD-10-CM | POA: Diagnosis not present

## 2020-02-24 DIAGNOSIS — I480 Paroxysmal atrial fibrillation: Secondary | ICD-10-CM | POA: Diagnosis not present

## 2020-02-24 DIAGNOSIS — Z23 Encounter for immunization: Secondary | ICD-10-CM | POA: Diagnosis not present

## 2020-02-24 NOTE — Telephone Encounter (Signed)
Per pt is on way to see Dr Delfina Redwood and is aware of Dr Hochrein's recommendations Will forward this note to Dr Polite./cy

## 2020-02-26 ENCOUNTER — Telehealth: Payer: Self-pay | Admitting: Cardiology

## 2020-02-26 DIAGNOSIS — J1282 Pneumonia due to coronavirus disease 2019: Secondary | ICD-10-CM | POA: Diagnosis not present

## 2020-02-26 DIAGNOSIS — I951 Orthostatic hypotension: Secondary | ICD-10-CM | POA: Diagnosis not present

## 2020-02-26 DIAGNOSIS — N183 Chronic kidney disease, stage 3 unspecified: Secondary | ICD-10-CM | POA: Diagnosis not present

## 2020-02-26 DIAGNOSIS — M170 Bilateral primary osteoarthritis of knee: Secondary | ICD-10-CM | POA: Diagnosis not present

## 2020-02-26 DIAGNOSIS — U071 COVID-19: Secondary | ICD-10-CM | POA: Diagnosis not present

## 2020-02-26 DIAGNOSIS — I4891 Unspecified atrial fibrillation: Secondary | ICD-10-CM | POA: Diagnosis not present

## 2020-02-26 DIAGNOSIS — I1310 Hypertensive heart and chronic kidney disease without heart failure, with stage 1 through stage 4 chronic kidney disease, or unspecified chronic kidney disease: Secondary | ICD-10-CM | POA: Diagnosis not present

## 2020-02-26 DIAGNOSIS — E7800 Pure hypercholesterolemia, unspecified: Secondary | ICD-10-CM | POA: Diagnosis not present

## 2020-02-26 DIAGNOSIS — E785 Hyperlipidemia, unspecified: Secondary | ICD-10-CM | POA: Diagnosis not present

## 2020-02-26 NOTE — Telephone Encounter (Signed)
New message:      Patient returning call back from yesterday.

## 2020-02-26 NOTE — Telephone Encounter (Signed)
Pt states he had the guaiac sample collected when he visited Dr. Lina Sar office 10/13. Pt states [name] prescribed him Eliquis and wanted to know if Dr. Percival Spanish was in agreement. Informed pt that Dr. Percival Spanish also approved of starting eliquis pending the negative fecal occult blood test, per his remarks in the chart earlier this week. Pt scheduled for follow up appt with Almyra Deforest, PA-C on 02/29/20 at 8:45 a.m. per Dr. Rosezella Florida recommendations.   In a separate call, I reached out to Dr. Lina Sar office to see if occult blood test had resulted yet but had to leave a message.

## 2020-02-29 ENCOUNTER — Encounter: Payer: Self-pay | Admitting: Physician Assistant

## 2020-02-29 ENCOUNTER — Telehealth: Payer: Self-pay

## 2020-02-29 ENCOUNTER — Telehealth (INDEPENDENT_AMBULATORY_CARE_PROVIDER_SITE_OTHER): Payer: Medicare HMO | Admitting: Physician Assistant

## 2020-02-29 ENCOUNTER — Telehealth: Payer: Self-pay | Admitting: Cardiology

## 2020-02-29 VITALS — BP 148/79 | HR 93 | Ht 77.0 in | Wt 265.0 lb

## 2020-02-29 DIAGNOSIS — I1310 Hypertensive heart and chronic kidney disease without heart failure, with stage 1 through stage 4 chronic kidney disease, or unspecified chronic kidney disease: Secondary | ICD-10-CM | POA: Diagnosis not present

## 2020-02-29 DIAGNOSIS — E7800 Pure hypercholesterolemia, unspecified: Secondary | ICD-10-CM | POA: Diagnosis not present

## 2020-02-29 DIAGNOSIS — R42 Dizziness and giddiness: Secondary | ICD-10-CM

## 2020-02-29 DIAGNOSIS — I10 Essential (primary) hypertension: Secondary | ICD-10-CM

## 2020-02-29 DIAGNOSIS — E785 Hyperlipidemia, unspecified: Secondary | ICD-10-CM

## 2020-02-29 DIAGNOSIS — I712 Thoracic aortic aneurysm, without rupture, unspecified: Secondary | ICD-10-CM

## 2020-02-29 DIAGNOSIS — U071 COVID-19: Secondary | ICD-10-CM | POA: Diagnosis not present

## 2020-02-29 DIAGNOSIS — J1282 Pneumonia due to coronavirus disease 2019: Secondary | ICD-10-CM | POA: Diagnosis not present

## 2020-02-29 DIAGNOSIS — I4891 Unspecified atrial fibrillation: Secondary | ICD-10-CM | POA: Diagnosis not present

## 2020-02-29 DIAGNOSIS — D649 Anemia, unspecified: Secondary | ICD-10-CM

## 2020-02-29 DIAGNOSIS — I480 Paroxysmal atrial fibrillation: Secondary | ICD-10-CM | POA: Diagnosis not present

## 2020-02-29 DIAGNOSIS — N183 Chronic kidney disease, stage 3 unspecified: Secondary | ICD-10-CM

## 2020-02-29 DIAGNOSIS — M170 Bilateral primary osteoarthritis of knee: Secondary | ICD-10-CM | POA: Diagnosis not present

## 2020-02-29 DIAGNOSIS — I951 Orthostatic hypotension: Secondary | ICD-10-CM | POA: Diagnosis not present

## 2020-02-29 MED ORDER — PANTOPRAZOLE SODIUM 40 MG PO TBEC: 40.0000 mg | DELAYED_RELEASE_TABLET | Freq: Every day | ORAL | 3 refills | Status: DC

## 2020-02-29 MED ORDER — DILTIAZEM HCL ER COATED BEADS 240 MG PO CP24: 240.0000 mg | ORAL_CAPSULE | Freq: Every day | ORAL | 3 refills | Status: DC

## 2020-02-29 NOTE — Telephone Encounter (Signed)
Home Health Verbal Orders - Caller/Agency: Spoke with Clair Gulling, PT with [facility] Number: [phone]   Clair Gulling is requesting the patients hard stop max HR limit when exercising during physical therapy?  Will send to [employer] for advisement.

## 2020-02-29 NOTE — Telephone Encounter (Signed)
Home Health Verbal Orders - Caller/Agency:  Clair Gulling with [facility] Number: [phone] He is requesting a call back with some info on pt heart rate.  He would like to know his hard limit?

## 2020-02-29 NOTE — Telephone Encounter (Signed)
  Patient Consent for Virtual Visit         Kevin Merritt has provided verbal consent on 02/29/2020 for a virtual visit (video or telephone).   CONSENT FOR VIRTUAL VISIT FOR:  Kevin Merritt  By participating in this virtual visit I agree to the following:  I hereby voluntarily request, consent and authorize [facility] and its employed or contracted physicians, physician assistants, nurse practitioners or other licensed health care professionals (the Practitioner), to provide me with telemedicine health care services (the "Services") as deemed necessary by the treating Practitioner. I acknowledge and consent to receive the Services by the Practitioner via telemedicine. I understand that the telemedicine visit will involve communicating with the Practitioner through live audiovisual communication technology and the disclosure of certain medical information by electronic transmission. I acknowledge that I have been given the opportunity to request an in-person assessment or other available alternative prior to the telemedicine visit and am voluntarily participating in the telemedicine visit.  I understand that I have the right to withhold or withdraw my consent to the use of telemedicine in the course of my care at any time, without affecting my right to future care or treatment, and that the Practitioner or I may terminate the telemedicine visit at any time. I understand that I have the right to inspect all information obtained and/or recorded in the course of the telemedicine visit and may receive copies of available information for a reasonable fee.  I understand that some of the potential risks of receiving the Services via telemedicine include:  Marland Kitchen Delay or interruption in medical evaluation due to technological equipment failure or disruption; . Information transmitted may not be sufficient (e.g. poor resolution of images) to allow for appropriate medical decision making by the  Practitioner; and/or  . In rare instances, security protocols could fail, causing a breach of personal health information.  Furthermore, I acknowledge that it is my responsibility to provide information about my medical history, conditions and care that is complete and accurate to the best of my ability. I acknowledge that Practitioner's advice, recommendations, and/or decision may be based on factors not within their control, such as incomplete or inaccurate data provided by me or distortions of diagnostic images or specimens that may result from electronic transmissions. I understand that the practice of medicine is not an exact science and that Practitioner makes no warranties or guarantees regarding treatment outcomes. I acknowledge that a copy of this consent can be made available to me via my patient portal ([facility]), or I can request a printed copy by calling the office of [facility].    I understand that my insurance will be billed for this visit.   I have read or had this consent read to me. . I understand the contents of this consent, which adequately explains the benefits and risks of the Services being provided via telemedicine.  . I have been provided ample opportunity to ask questions regarding this consent and the Services and have had my questions answered to my satisfaction. . I give my informed consent for the services to be provided through the use of telemedicine in my medical care

## 2020-02-29 NOTE — Progress Notes (Signed)
Virtual Visit via Telephone Note   This visit type was conducted due to national recommendations for restrictions regarding the COVID-19 Pandemic (e.g. social distancing) in an effort to limit this patient's exposure and mitigate transmission in our community.  Due to his co-morbid illnesses, this patient is at least at moderate risk for complications without adequate follow up.  This format is felt to be most appropriate for this patient at this time.  The patient did not have access to video technology/had technical difficulties with video requiring transitioning to audio format only (telephone).  All issues noted in this document were discussed and addressed.  No physical exam could be performed with this format.  Please refer to the patient's chart for his  consent to telehealth for [facility].    Date:  03/02/2020   ID:  Kevin Merritt, DOB [birth year], MRN [id number] The patient was identified using 2 identifiers.  Patient Location: Home Provider Location: Office/Clinic  PCP:  [name], MD  Cardiologist:  Minus Breeding, MD  Electrophysiologist:  None   Evaluation Performed:  Follow-Up Visit  Chief Complaint:  Follow up  History of Present Illness:    Kevin Merritt is a 69 y.o. male with past medical history of HTN, HLD, CKD stage III, obesity, newly diagnosed A. Fib, TAA and a history of prostate cancer s/p radiation.  He was previously seen by Dr. Ellyn Hack in 2015.  Myoview obtained in 10/2013 was low risk.  Echocardiogram at the time showed moderate LVH, EF 65 to 70%, grade 1 DD, trivial AI.  Patient was recently admitted to the hospital on 02/01/2020 with increasing dizziness and syncope.  EMS noted he was in new atrial fibrillation with RVR.  On arrival to the ED, he was also found to have positive orthostasis with systolic blood pressure dropping from 130 down to 84.  Lab work obtained in the ED revealed he was positive for COVID-19.  Creatinine mildly elevated at 1.69.   White blood cell count low at 3.4.  TSH normal.  CTA was negative for PE however does show mild bibasilar atelectasis, mild thickening of peribronchovascular interstitium likely representing trace bilateral perihilar pulmonary edema without infiltrate, severe LVH and PAH possibly result of diastolic dysfunction, 4.3 cm ascending aortic aneurysm, right thyroid nodularity, aortic atherosclerosis.  Fortunately, he converted in the emergency room.  Anticoagulation was not started during the hospitalization as it was suspected his A. fib was isolated event.  Outpatient 4-week event monitor was arranged.  Limited echocardiogram obtained during this admission on 02/03/2020 showed EF 60 to 65%, grade 2 DD, moderate LVH, mild AI. Since discharge, her monitor has picked up recurrence of atrial fibrillation.  He was seen at Dr. Lina Sar office on 02/24/2020, a guaiac stool sample was collected to make sure he does not have further GI bleed and he was started on Eliquis.  Patient was contacted via virtual visit.  He still hasn't started on the Eliquis yet, we discussed the importance of using the Eliquis for stroke prevention.  He will discontinue aspirin to start on Eliquis today.  I recommended repeat CBC in 2 weeks to make sure he does not have any worsening anemia.  Otherwise he has been feeling well since he left the hospital.  He still has some rare episodes of dizziness when he moves around too quickly.  Systolic blood pressure is mildly elevated today and so is his heart rate.  I did not try to increase on the diltiazem dose in fear of worsening  his orthostatic hypotension.  We will wait for the heart monitor report to decide whether or not to increase the diltiazem based on his average heart rate and A. fib burden.  The last day he will wear the heart monitor is on November 5, he plan to return the heart monitor on November 6.  His appointment with Dr. Percival Spanish is on November 5.  If Dr. Percival Spanish has any open slots, I  recommended move his appointment back by 1 to 2 weeks, however if there is no open slot, we'll keep the current appointment scheduled.  The patient does not have symptoms concerning for COVID-19 infection (fever, chills, cough, or new shortness of breath).    Past Medical History:  Diagnosis Date   CKD (chronic kidney disease), stage III (HCC)    High cholesterol    Hypertension    Prostate cancer [facility])    Past Surgical History:  Procedure Laterality Date   COLONOSCOPY W/ POLYPECTOMY  2012   HERNIA REPAIR Right    Age 13   NM MYOVIEW LTD  10/15/2013   7 min, 8.5 METs, 57%, ASx ST depressions - No ischemia / infarction.   THYROIDECTOMY     Age 65   TONSILLECTOMY     Age 57   TRANSTHORACIC ECHOCARDIOGRAM  10/28/2013   Nl LV Size & function; EF 65-70%, mod Conc LVH with Gd 2 DD     Current Meds  Medication Sig   atorvastatin (LIPITOR) 20 MG tablet Take 20 mg by mouth daily.   diltiazem (CARDIZEM CD) 240 MG 24 hr capsule Take 1 capsule (240 mg total) by mouth daily.   tamsulosin (FLOMAX) 0.4 MG CAPS capsule Take 1 capsule (0.4 mg total) by mouth daily after supper.   [DISCONTINUED] aspirin EC 81 MG tablet Take 81 mg by mouth daily.   [DISCONTINUED] diltiazem (CARDIZEM CD) 240 MG 24 hr capsule Take 1 capsule (240 mg total) by mouth daily.     Allergies:   Shellfish allergy   Social History   Tobacco Use   Smoking status: Never Smoker   Smokeless tobacco: Never Used  Substance Use Topics   Alcohol use: No   Drug use: No     Family Hx: The patient's family history includes Diabetes (age of onset: 71) in his sister; Heart failure (age of onset: 46) in his mother; Hypertension in his mother and sister.  ROS:   Please see the history of present illness.     All other systems reviewed and are negative.   Prior CV studies:   The following studies were reviewed today:   Echo 02/03/2020 1. Left ventricular ejection fraction, by estimation, is 60 to 65%.  The  left ventricle has normal function. The left ventricle has no regional  wall motion abnormalities. There is moderate concentric left ventricular  hypertrophy. Left ventricular  diastolic parameters are consistent with Grade II diastolic dysfunction  (pseudonormalization).  2. Right ventricular systolic function is normal. The right ventricular  size is normal.  3. Trivial mitral valve regurgitation.  4. There is mild calcification of the aortic valve. There is mild  thickening of the aortic valve. Aortic valve regurgitation is mild. Mild  aortic valve sclerosis is present, with no evidence of aortic valve  stenosis.    Labs/Other Tests and Data Reviewed:    EKG:  An ECG dated 02/03/2020 was personally reviewed today and demonstrated:  atrial fibrillation  Recent Labs: 02/03/2020: B Natriuretic Peptide 147.2; TSH 1.459 02/04/2020: Magnesium 1.9 02/07/2020:  ALT 57; BUN 9; Creatinine, Ser 0.98; Hemoglobin 11.3; Platelets 179; Potassium 3.9; Sodium 139   Recent Lipid Panel No results found for: CHOL, TRIG, HDL, CHOLHDL, LDLCALC, LDLDIRECT  Wt Readings from Last 3 Encounters:  02/29/20 265 lb (120.2 kg)  02/07/20 272 lb 7 oz (123.6 kg)  11/18/13 247 lb 9.6 oz (112.3 kg)     Risk Assessment/Calculations:     CHA2DS2-VASc Score = 2  This indicates a 2.2% annual risk of stroke. The patient's score is based upon: CHF History: 0 HTN History: 1 Diabetes History: 0 Stroke History: 0 Vascular Disease History: 0 Age Score: 1 Gender Score: 0     Objective:    Vital Signs:  BP (!) 148/79    Pulse 93    Ht 6\' 5"  (1.956 m)    Wt 265 lb (120.2 kg)    BMI 31.42 kg/m    VITAL SIGNS:  reviewed   ASSESSMENT & PLAN:    1. Paroxysmal atrial fibrillation: I encouraged the patient to start on the Eliquis, we will discontinue aspirin.  Heart rate was borderline elevated on the current dose of diltiazem, however I am hesitant to further increase the diltiazem dose given the recent  orthostatic dizziness.  Obtain CBC in 2 weeks.  Continue heart monitor, based on the heart monitor result, may need to increase the diltiazem dose.  2. Hypertension: Blood pressure mildly elevated today, however he had orthostatic hypotension in the hospital.  3. Hyperlipidemia: On Lipitor  4. CKD stage III: Stable on last lab work  5. Thoracic aortic aneurysm: Recent image showed thoracic aorta measuring at 43 mm  6. Orthostatic dizziness: Continue to have some degree of dizzy spell.  COVID-19 Education: The signs and symptoms of COVID-19 were discussed with the patient and how to seek care for testing (follow up with PCP or arrange E-visit).  The importance of social distancing was discussed today.  Time:   Today, I have spent 13 minutes with the patient with telehealth technology discussing the above problems.     Medication Adjustments/Labs and Tests Ordered: Current medicines are reviewed at length with the patient today.  Concerns regarding medicines are outlined above.   Tests Ordered: Orders Placed This Encounter  Procedures   CBC    Medication Changes: Meds ordered this encounter  Medications   diltiazem (CARDIZEM CD) 240 MG 24 hr capsule    Sig: Take 1 capsule (240 mg total) by mouth daily.    Dispense:  90 capsule    Refill:  3   pantoprazole (PROTONIX) 40 MG tablet    Sig: Take 1 tablet (40 mg total) by mouth daily.    Dispense:  90 tablet    Refill:  3    Follow Up:  In Person in 2 week(s)  Signed, Almyra Deforest, [place]  03/02/2020 10:58 PM    [facility]

## 2020-02-29 NOTE — Patient Instructions (Addendum)
Medication Instructions:   STOP Aspirin   START Eliquis 5 mg tonight. Take 1 tablet 2 times a day going forward.  RESTART Pantoprazole (Protonix) 40 mg daily  *If you need a refill on your cardiac medications before your next appointment, please call your pharmacy*  Lab Work: Your physician recommends that you return for lab work in 2 weeks:   CBC  If you have labs (blood work) drawn today and your tests are completely normal, you will receive your results only by: Marland Kitchen MyChart Message (if you have MyChart) OR . A paper copy in the mail If you have any lab test that is abnormal or we need to change your treatment, we will call you to review the results.  Testing/Procedures: NONE ordered at this time of appointment   Follow-Up: At [facility], you and your health needs are our priority.  As part of our continuing mission to provide you with exceptional heart care, we have created designated Provider Care Teams.  These Care Teams include your primary Cardiologist (physician) and Advanced Practice Providers (APPs -  Physician Assistants and Nurse Practitioners) who all work together to provide you with the care you need, when you need it.   Your next appointment:   As scheduled    The format for your next appointment:   In Person  Provider:   Minus Breeding, MD  Other Instructions

## 2020-03-02 ENCOUNTER — Telehealth: Payer: Self-pay | Admitting: Cardiology

## 2020-03-02 DIAGNOSIS — N183 Chronic kidney disease, stage 3 unspecified: Secondary | ICD-10-CM | POA: Diagnosis not present

## 2020-03-02 DIAGNOSIS — I4891 Unspecified atrial fibrillation: Secondary | ICD-10-CM | POA: Diagnosis not present

## 2020-03-02 DIAGNOSIS — I951 Orthostatic hypotension: Secondary | ICD-10-CM | POA: Diagnosis not present

## 2020-03-02 DIAGNOSIS — I1310 Hypertensive heart and chronic kidney disease without heart failure, with stage 1 through stage 4 chronic kidney disease, or unspecified chronic kidney disease: Secondary | ICD-10-CM | POA: Diagnosis not present

## 2020-03-02 DIAGNOSIS — M170 Bilateral primary osteoarthritis of knee: Secondary | ICD-10-CM | POA: Diagnosis not present

## 2020-03-02 DIAGNOSIS — E785 Hyperlipidemia, unspecified: Secondary | ICD-10-CM | POA: Diagnosis not present

## 2020-03-02 DIAGNOSIS — E7800 Pure hypercholesterolemia, unspecified: Secondary | ICD-10-CM | POA: Diagnosis not present

## 2020-03-02 DIAGNOSIS — U071 COVID-19: Secondary | ICD-10-CM | POA: Diagnosis not present

## 2020-03-02 DIAGNOSIS — J1282 Pneumonia due to coronavirus disease 2019: Secondary | ICD-10-CM | POA: Diagnosis not present

## 2020-03-02 NOTE — Telephone Encounter (Signed)
Pt c/o medication issue:  1. Name of Medication:  pantoprazole (PROTONIX) 40 MG tablet   2. How are you currently taking this medication (dosage and times per day)?   Resumed taking medication on Monday 02/29/2020.   3. Are you having a reaction (difficulty breathing--STAT)?   Not that he's aware of   4. What is your medication issue?  Would like to know more information regarding this medication, patient says he's on several medications and wants to know more about what side effects to look for if anything.

## 2020-03-02 NOTE — Telephone Encounter (Signed)
Stacy with [facility] [facility] is requesting additional information in order to process request for monitor. Please return call soon as she states the case will be closing tomorrow.

## 2020-03-02 NOTE — Telephone Encounter (Signed)
Spoke with patient regarding patients pantoprazole prescription. Patient states he was recently taken off of pantoprazole but then received a refill for the medication. Patient would like to know if he should continue taking the pantoprazole or not. Advised patient I would forward message to provider. Patient verbalized understanding.

## 2020-03-02 NOTE — Telephone Encounter (Signed)
Spoke with Marzetta Board at [facility] [facility] regarding approval for pt's heart monitor. Approval granted. States our office should receive documentation within a couple of hours.

## 2020-03-02 NOTE — Telephone Encounter (Signed)
Returned call to patient and informed him that I will get with Almyra Deforest, PA-C to verify that he wanted him to continue the Pantoprazole and give him a call back with the needed information.

## 2020-03-03 NOTE — Telephone Encounter (Signed)
Called patient after speaking with Almyra Deforest, PA-C about the pantoprazole. I informed Kevin Merritt that Isaac Laud does want him to restart the pantoprazole 40 mg daily. Patient verbalized understanding and thanked me for calling him back in a timely manner. All questions (if any) were answered.

## 2020-03-03 NOTE — Telephone Encounter (Signed)
Left message with Clair Gulling, currently set the HR max limit to 120 bpm until the patient can be seen back on the next follow up

## 2020-03-04 ENCOUNTER — Ambulatory Visit
Admission: RE | Admit: 2020-03-04 | Discharge: 2020-03-04 | Disposition: A | Discharge status: Home / Self Care | Payer: Medicare HMO | Source: Ambulatory Visit | Attending: Internal Medicine | Admitting: Internal Medicine

## 2020-03-04 ENCOUNTER — Other Ambulatory Visit: Payer: Self-pay | Admitting: Internal Medicine

## 2020-03-04 DIAGNOSIS — R059 Cough, unspecified: Secondary | ICD-10-CM | POA: Diagnosis not present

## 2020-03-04 DIAGNOSIS — I480 Paroxysmal atrial fibrillation: Secondary | ICD-10-CM | POA: Diagnosis not present

## 2020-03-04 DIAGNOSIS — I959 Hypotension, unspecified: Secondary | ICD-10-CM | POA: Diagnosis not present

## 2020-03-04 DIAGNOSIS — R0602 Shortness of breath: Secondary | ICD-10-CM | POA: Diagnosis not present

## 2020-03-10 ENCOUNTER — Telehealth: Payer: Self-pay | Admitting: Physician Assistant

## 2020-03-10 DIAGNOSIS — I4891 Unspecified atrial fibrillation: Secondary | ICD-10-CM | POA: Diagnosis not present

## 2020-03-10 DIAGNOSIS — I951 Orthostatic hypotension: Secondary | ICD-10-CM | POA: Diagnosis not present

## 2020-03-10 DIAGNOSIS — U071 COVID-19: Secondary | ICD-10-CM | POA: Diagnosis not present

## 2020-03-10 DIAGNOSIS — N182 Chronic kidney disease, stage 2 (mild): Secondary | ICD-10-CM | POA: Diagnosis not present

## 2020-03-10 DIAGNOSIS — J1282 Pneumonia due to coronavirus disease 2019: Secondary | ICD-10-CM | POA: Diagnosis not present

## 2020-03-10 DIAGNOSIS — N183 Chronic kidney disease, stage 3 unspecified: Secondary | ICD-10-CM | POA: Diagnosis not present

## 2020-03-10 DIAGNOSIS — E782 Mixed hyperlipidemia: Secondary | ICD-10-CM | POA: Diagnosis not present

## 2020-03-10 DIAGNOSIS — C61 Malignant neoplasm of prostate: Secondary | ICD-10-CM | POA: Diagnosis not present

## 2020-03-10 DIAGNOSIS — I10 Essential (primary) hypertension: Secondary | ICD-10-CM | POA: Diagnosis not present

## 2020-03-10 DIAGNOSIS — M170 Bilateral primary osteoarthritis of knee: Secondary | ICD-10-CM | POA: Diagnosis not present

## 2020-03-10 DIAGNOSIS — E785 Hyperlipidemia, unspecified: Secondary | ICD-10-CM | POA: Diagnosis not present

## 2020-03-10 DIAGNOSIS — E7800 Pure hypercholesterolemia, unspecified: Secondary | ICD-10-CM | POA: Diagnosis not present

## 2020-03-10 DIAGNOSIS — I1310 Hypertensive heart and chronic kidney disease without heart failure, with stage 1 through stage 4 chronic kidney disease, or unspecified chronic kidney disease: Secondary | ICD-10-CM | POA: Diagnosis not present

## 2020-03-10 NOTE — Telephone Encounter (Signed)
Paged by [facility]. Monitor detected 16 beats of NSVT. Underlying rhythm is sinus. It was auto-trigger event at 5:41pm today. I called the patient but did not picked up phone.

## 2020-03-11 ENCOUNTER — Ambulatory Visit: Payer: Medicare HMO | Admitting: Cardiology

## 2020-03-11 DIAGNOSIS — E785 Hyperlipidemia, unspecified: Secondary | ICD-10-CM | POA: Diagnosis not present

## 2020-03-11 DIAGNOSIS — I1310 Hypertensive heart and chronic kidney disease without heart failure, with stage 1 through stage 4 chronic kidney disease, or unspecified chronic kidney disease: Secondary | ICD-10-CM | POA: Diagnosis not present

## 2020-03-11 DIAGNOSIS — I4891 Unspecified atrial fibrillation: Secondary | ICD-10-CM | POA: Diagnosis not present

## 2020-03-11 DIAGNOSIS — E7800 Pure hypercholesterolemia, unspecified: Secondary | ICD-10-CM | POA: Diagnosis not present

## 2020-03-11 DIAGNOSIS — M170 Bilateral primary osteoarthritis of knee: Secondary | ICD-10-CM | POA: Diagnosis not present

## 2020-03-11 DIAGNOSIS — I951 Orthostatic hypotension: Secondary | ICD-10-CM | POA: Diagnosis not present

## 2020-03-11 DIAGNOSIS — N183 Chronic kidney disease, stage 3 unspecified: Secondary | ICD-10-CM | POA: Diagnosis not present

## 2020-03-11 DIAGNOSIS — U071 COVID-19: Secondary | ICD-10-CM | POA: Diagnosis not present

## 2020-03-11 DIAGNOSIS — J1282 Pneumonia due to coronavirus disease 2019: Secondary | ICD-10-CM | POA: Diagnosis not present

## 2020-03-11 NOTE — Telephone Encounter (Signed)
Return call to pt to follow up on paged received by Leanor Kail, PA. [employer] reported 16 beat NSVT around 5:41 pm yesterday afternoon.   Monitor report reviewed by Dr. Lujean Rave (DOD) that note 4 beat run of VTach. Nurse spoke to pt who report he doesn't recall any unusual symptoms outside of his normal light headedness with movement. MD made aware and recommend to keep scheduled appointment with Dr. Percival Spanish on 03/18/20.   Pt also report difficult sleeping and feeling like he's not getting enough air at night. Will route to MD to make aware.

## 2020-03-14 ENCOUNTER — Telehealth: Payer: Self-pay | Admitting: Cardiology

## 2020-03-14 NOTE — Telephone Encounter (Signed)
Pt advised to keep his 03/18/20 appt woth Dr. Percival Spanish.. we have a number of recordings from his monitor and it has been suggested by the APP that he keeps his appt.

## 2020-03-14 NOTE — Telephone Encounter (Signed)
New Message:      Pt wants to know if he is supposed to still keep his appt on 03-18-20? He said he though Dr Percival Spanish was not supposed to see him until he stopped wearing his Monitor.

## 2020-03-15 DIAGNOSIS — E7800 Pure hypercholesterolemia, unspecified: Secondary | ICD-10-CM | POA: Diagnosis not present

## 2020-03-15 DIAGNOSIS — I480 Paroxysmal atrial fibrillation: Secondary | ICD-10-CM | POA: Diagnosis not present

## 2020-03-15 DIAGNOSIS — N182 Chronic kidney disease, stage 2 (mild): Secondary | ICD-10-CM | POA: Diagnosis not present

## 2020-03-15 DIAGNOSIS — E782 Mixed hyperlipidemia: Secondary | ICD-10-CM | POA: Diagnosis not present

## 2020-03-15 DIAGNOSIS — I4891 Unspecified atrial fibrillation: Secondary | ICD-10-CM | POA: Diagnosis not present

## 2020-03-15 DIAGNOSIS — C61 Malignant neoplasm of prostate: Secondary | ICD-10-CM | POA: Diagnosis not present

## 2020-03-15 DIAGNOSIS — I10 Essential (primary) hypertension: Secondary | ICD-10-CM | POA: Diagnosis not present

## 2020-03-16 DIAGNOSIS — U071 COVID-19: Secondary | ICD-10-CM | POA: Diagnosis not present

## 2020-03-16 DIAGNOSIS — M170 Bilateral primary osteoarthritis of knee: Secondary | ICD-10-CM | POA: Diagnosis not present

## 2020-03-16 DIAGNOSIS — E7800 Pure hypercholesterolemia, unspecified: Secondary | ICD-10-CM | POA: Diagnosis not present

## 2020-03-16 DIAGNOSIS — I1310 Hypertensive heart and chronic kidney disease without heart failure, with stage 1 through stage 4 chronic kidney disease, or unspecified chronic kidney disease: Secondary | ICD-10-CM | POA: Diagnosis not present

## 2020-03-16 DIAGNOSIS — E785 Hyperlipidemia, unspecified: Secondary | ICD-10-CM | POA: Diagnosis not present

## 2020-03-16 DIAGNOSIS — I951 Orthostatic hypotension: Secondary | ICD-10-CM | POA: Diagnosis not present

## 2020-03-16 DIAGNOSIS — J1282 Pneumonia due to coronavirus disease 2019: Secondary | ICD-10-CM | POA: Diagnosis not present

## 2020-03-16 DIAGNOSIS — I4891 Unspecified atrial fibrillation: Secondary | ICD-10-CM | POA: Diagnosis not present

## 2020-03-16 DIAGNOSIS — N183 Chronic kidney disease, stage 3 unspecified: Secondary | ICD-10-CM | POA: Diagnosis not present

## 2020-03-17 DIAGNOSIS — I712 Thoracic aortic aneurysm, without rupture, unspecified: Secondary | ICD-10-CM | POA: Insufficient documentation

## 2020-03-17 DIAGNOSIS — I4819 Other persistent atrial fibrillation: Secondary | ICD-10-CM | POA: Insufficient documentation

## 2020-03-17 DIAGNOSIS — I480 Paroxysmal atrial fibrillation: Secondary | ICD-10-CM | POA: Insufficient documentation

## 2020-03-17 DIAGNOSIS — I951 Orthostatic hypotension: Secondary | ICD-10-CM

## 2020-03-17 DIAGNOSIS — N1831 Chronic kidney disease, stage 3a: Secondary | ICD-10-CM | POA: Insufficient documentation

## 2020-03-17 DIAGNOSIS — I4729 Other ventricular tachycardia: Secondary | ICD-10-CM | POA: Insufficient documentation

## 2020-03-17 DIAGNOSIS — N183 Chronic kidney disease, stage 3 unspecified: Secondary | ICD-10-CM | POA: Insufficient documentation

## 2020-03-17 DIAGNOSIS — I472 Ventricular tachycardia: Secondary | ICD-10-CM | POA: Insufficient documentation

## 2020-03-17 HISTORY — DX: Orthostatic hypotension: I95.1

## 2020-03-17 HISTORY — DX: Other ventricular tachycardia: I47.29

## 2020-03-17 HISTORY — DX: Paroxysmal atrial fibrillation: I48.0

## 2020-03-17 HISTORY — DX: Thoracic aortic aneurysm, without rupture, unspecified: I71.20

## 2020-03-17 NOTE — Progress Notes (Signed)
Cardiology Office Note   Date:  03/18/2020   ID:  Terelle Dobler, DOB December 30, 1950, MRN [id number]  PCP:  [name], MD  Cardiologist:   Minus Breeding, MD   Chief Complaint  Patient presents with  . Atrial Fibrillation      History of Present Illness: Kevin Merritt is a 69 y.o. male who was previously seen by Dr. Ellyn Hack in 2015.  Myoview obtained in 10/2013 was low risk.  Echocardiogram at the time showed moderate LVH, EF 65 to 70%, grade 1 DD, trivial AI.  The patient was recently admitted to the hospital on 02/01/2020 with increasing dizziness and syncope.  EMS noted he was in new atrial fibrillation with RVR.  On arrival to the ED, he was also found to have positive orthostasis with systolic blood pressure dropping from 130 down to 84.  Lab work obtained in the ED revealed he was positive for COVID-19.   He has a thoracic aneurysm of 4.3 cm.   He wore a monitor and was found recently to have NSVT.  The rhythm was sinus.  There was sinus tachycardia.  There was some baseline artifact.  He did have a 6-second run of nonsustained ventricular tachycardia and other shorter runs.  Of note during his evaluation in the hospital he had an echocardiogram which demonstrated an EF of 60 to 65%.  There was some moderate concentric left ventricular hypertrophy.    He has not felt any palpitations.  He is not having any presyncope or syncope.  He still wearing a monitor but this is the last day.  He denies any chest pressure, neck or arm discomfort.  He is not having cough or shortness of breath that he was having.  He is not having the dizzy orthostasis that he was having though he is occasionally lightheaded.  He has had no chest pressure, neck or arm discomfort.  He has had no weight gain or edema.  Past Medical History:  Diagnosis Date  . CKD (chronic kidney disease), stage III ([place])   . High cholesterol   . Hypertension   . Prostate cancer [facility])     Past Surgical History:  Procedure  Laterality Date  . COLONOSCOPY W/ POLYPECTOMY  2012  . HERNIA REPAIR Right    Age 34  . NM MYOVIEW LTD  10/15/2013   7 min, 8.5 METs, 57%, ASx ST depressions - No ischemia / infarction.  . THYROIDECTOMY     Age 89  . TONSILLECTOMY     Age 67  . TRANSTHORACIC ECHOCARDIOGRAM  10/28/2013   Nl LV Size & function; EF 65-70%, mod Conc LVH with Gd 2 DD     Current Outpatient Medications  Medication Sig Dispense Refill  . albuterol (VENTOLIN HFA) 108 (90 Base) MCG/ACT inhaler     . atorvastatin (LIPITOR) 20 MG tablet Take 20 mg by mouth daily.    Marland Kitchen diltiazem (CARDIZEM CD) 240 MG 24 hr capsule Take 1 capsule (240 mg total) by mouth daily. 90 capsule 3  . ELIQUIS 5 MG TABS tablet     . leuprolide, 6 Month, (ELIGARD) 45 MG injection Inject 45 mg into the skin every 6 (six) months.    . pantoprazole (PROTONIX) 40 MG tablet Take 1 tablet (40 mg total) by mouth daily. 90 tablet 3  . tamsulosin (FLOMAX) 0.4 MG CAPS capsule Take 1 capsule (0.4 mg total) by mouth daily after supper. 30 capsule 0   No current facility-administered medications for this visit.  Allergies:   Shellfish allergy    ROS:  Please see the history of present illness.   Otherwise, review of systems are positive for none.   All other systems are reviewed and negative.    PHYSICAL EXAM: VS:  BP (!) 122/58   Pulse 91   Ht 6\' 5"  (1.956 m)   Wt 258 lb 12.8 oz (117.4 kg)   SpO2 98%   BMI 30.69 kg/m  , BMI Body mass index is 30.69 kg/m. GENERAL:  Well appearing NECK:  No jugular venous distention, waveform within normal limits, carotid upstroke brisk and symmetric, no bruits, no thyromegaly LUNGS:  Clear to auscultation bilaterally CHEST:  Unremarkable HEART:  PMI not displaced or sustained,S1 and S2 within normal limits, no S3, no S4, no clicks, no rubs, soft apical systolic murmur heard best at the left upper sternal border, no diastolic murmurs ABD:  Flat, positive bowel sounds normal in frequency in pitch, no bruits,  no rebound, no guarding, no midline pulsatile mass, no hepatomegaly, no splenomegaly EXT:  2 plus pulses throughout, no edema, no cyanosis no clubbing   EKG:  EKG is not ordered today.   Recent Labs: 02/03/2020: B Natriuretic Peptide 147.2; TSH 1.459 02/04/2020: Magnesium 1.9 02/07/2020: ALT 57; BUN 9; Creatinine, Ser 0.98; Hemoglobin 11.3; Platelets 179; Potassium 3.9; Sodium 139    Lipid Panel No results found for: CHOL, TRIG, HDL, CHOLHDL, VLDL, LDLCALC, LDLDIRECT    Wt Readings from Last 3 Encounters:  03/18/20 258 lb 12.8 oz (117.4 kg)  02/29/20 265 lb (120.2 kg)  02/07/20 272 lb 7 oz (123.6 kg)      Other studies Reviewed: Additional studies/ records that were reviewed today include: Event monitor. Review of the above records demonstrates:  Please see elsewhere in the note.     ASSESSMENT AND PLAN:  Paroxysmal atrial fibrillation:    I do not see any evidence of fibrillation but I will wait to look at the results of the monitor when these are final.  I have also asked him to use a pulse oximeter that he has to see if we can pick up any rapid heart rates over the next couple of months.  If not and there is no evidence of fibrillation on the monitor probably discontinue the Eliquis when I see him back in a couple of months and start aspirin only.   Hypertension:  The BP is borderline.  Have asked him to lose weight rather than increasing medications.  I looked at her blood pressure diary and the systolics in the [id number].  If he cannot achieve this weight loss her blood pressures do not come down he might need the addition of another medication.   Hyperlipidemia:   He is being managed on Lipitor.   CKD stage III:   Creat was 1.38.    Thoracic aortic aneurysm:   I will follow this again with CT in the future.  He is a very tall man so this mild dilatation for his size.   Orthostatic dizziness:   We talked about this and he is not having any increased symptoms.  We talked about  precautions.   NSVT: I will check a magnesium and electrolytes.  I will also order perfusion imaging.  He has no symptoms related to this.  Current medicines are reviewed at length with the patient today.  The patient does not have concerns regarding medicines.  The following changes have been made:  no change  Labs/ tests ordered today include:  Orders Placed This Encounter  Procedures  . Magnesium  . Basic metabolic panel  . MYOCARDIAL PERFUSION IMAGING     Disposition:   FU with me or Almyra Deforest PA in two months.     Signed, Minus Breeding, MD  03/18/2020 9:31 AM    [place]

## 2020-03-18 ENCOUNTER — Encounter: Payer: Self-pay | Admitting: Cardiology

## 2020-03-18 ENCOUNTER — Encounter (HOSPITAL_COMMUNITY): Payer: Self-pay | Admitting: Cardiology

## 2020-03-18 ENCOUNTER — Ambulatory Visit (INDEPENDENT_AMBULATORY_CARE_PROVIDER_SITE_OTHER): Payer: Medicare HMO | Admitting: Cardiology

## 2020-03-18 ENCOUNTER — Other Ambulatory Visit: Payer: Self-pay

## 2020-03-18 VITALS — BP 122/58 | HR 91 | Ht 77.0 in | Wt 258.8 lb

## 2020-03-18 DIAGNOSIS — I951 Orthostatic hypotension: Secondary | ICD-10-CM | POA: Diagnosis not present

## 2020-03-18 DIAGNOSIS — I4729 Other ventricular tachycardia: Secondary | ICD-10-CM

## 2020-03-18 DIAGNOSIS — N1831 Chronic kidney disease, stage 3a: Secondary | ICD-10-CM | POA: Diagnosis not present

## 2020-03-18 DIAGNOSIS — I712 Thoracic aortic aneurysm, without rupture, unspecified: Secondary | ICD-10-CM

## 2020-03-18 DIAGNOSIS — I472 Ventricular tachycardia: Secondary | ICD-10-CM | POA: Diagnosis not present

## 2020-03-18 DIAGNOSIS — I10 Essential (primary) hypertension: Secondary | ICD-10-CM

## 2020-03-18 DIAGNOSIS — I480 Paroxysmal atrial fibrillation: Secondary | ICD-10-CM | POA: Diagnosis not present

## 2020-03-18 LAB — BASIC METABOLIC PANEL
BUN/Creatinine Ratio: 8 — ABNORMAL LOW (ref 10–24)
BUN: 10 mg/dL (ref 8–27)
CO2: 25 mmol/L (ref 20–29)
Calcium: 9.8 mg/dL (ref 8.6–10.2)
Chloride: 103 mmol/L (ref 96–106)
Creatinine, Ser: 1.28 mg/dL — ABNORMAL HIGH (ref 0.76–1.27)
GFR calc Af Amer: 65 mL/min/{1.73_m2} (ref >59)
GFR calc non Af Amer: 56 mL/min/{1.73_m2} — ABNORMAL LOW (ref >59)
Glucose: 90 mg/dL (ref 65–99)
Potassium: 4.9 mmol/L (ref 3.5–5.2)
Sodium: 142 mmol/L (ref 134–144)

## 2020-03-18 LAB — MAGNESIUM: Magnesium: 2.1 mg/dL (ref 1.6–2.3)

## 2020-03-18 NOTE — Patient Instructions (Signed)
Medication Instructions:  No changes *If you need a refill on your cardiac medications before your next appointment, please call your pharmacy*   Lab Work: CBC (previously ordered), BMET and Magnesium in our office today  If you have labs (blood work) drawn today and your tests are completely normal, you will receive your results only by: Marland Kitchen MyChart Message (if you have MyChart) OR . A paper copy in the mail If you have any lab test that is abnormal or we need to change your treatment, we will call you to review the results.   Testing/Procedures: Your physician has requested that you have a lexiscan myoview. A cardiac stress test is a cardiological test that measures the heart's ability to respond to stress in a controlled clinical environment. The stress response is induced by intravenous pharmacological stimulation. Please do not consume any caffeine (even "decaf" coffee or tea) 12 hours prior to your test. Please follow the instruction sheet as given.  For further information, please visit [email or web address].       Follow-Up: At [facility], you and your health needs are our priority.  As part of our continuing mission to provide you with exceptional heart care, we have created designated Provider Care Teams.  These Care Teams include your primary Cardiologist (physician) and Advanced Practice Providers (APPs -  Physician Assistants and Nurse Practitioners) who all work together to provide you with the care you need, when you need it.  We recommend signing up for the patient portal called "MyChart".  Sign up information is provided on this After Visit Summary.  MyChart is used to connect with patients for Virtual Visits (Telemedicine).  Patients are able to view lab/test results, encounter notes, upcoming appointments, etc.  Non-urgent messages can be sent to your provider as well.   To learn more about what you can do with MyChart, go to [email or web address].    Your next  appointment:   2 month(s)  The format for your next appointment:   In Person  Provider:   Minus Breeding, MD or Almyra Deforest, PA-C   Other Instructions None

## 2020-03-22 ENCOUNTER — Telehealth (HOSPITAL_COMMUNITY): Payer: Self-pay | Admitting: *Deleted

## 2020-03-22 NOTE — Telephone Encounter (Signed)
Close encounter 

## 2020-03-23 ENCOUNTER — Other Ambulatory Visit: Payer: Self-pay

## 2020-03-23 ENCOUNTER — Ambulatory Visit (HOSPITAL_COMMUNITY)
Admission: RE | Admit: 2020-03-23 | Discharge: 2020-03-23 | Disposition: A | Discharge status: Home / Self Care | Payer: Medicare HMO | Source: Ambulatory Visit | Attending: Internal Medicine | Admitting: Internal Medicine

## 2020-03-23 DIAGNOSIS — I472 Ventricular tachycardia: Secondary | ICD-10-CM | POA: Diagnosis not present

## 2020-03-23 DIAGNOSIS — Z8546 Personal history of malignant neoplasm of prostate: Secondary | ICD-10-CM | POA: Diagnosis not present

## 2020-03-23 DIAGNOSIS — R42 Dizziness and giddiness: Secondary | ICD-10-CM | POA: Diagnosis not present

## 2020-03-23 DIAGNOSIS — N183 Chronic kidney disease, stage 3 unspecified: Secondary | ICD-10-CM | POA: Insufficient documentation

## 2020-03-23 DIAGNOSIS — R5383 Other fatigue: Secondary | ICD-10-CM | POA: Diagnosis not present

## 2020-03-23 DIAGNOSIS — I129 Hypertensive chronic kidney disease with stage 1 through stage 4 chronic kidney disease, or unspecified chronic kidney disease: Secondary | ICD-10-CM | POA: Diagnosis not present

## 2020-03-23 DIAGNOSIS — R0609 Other forms of dyspnea: Secondary | ICD-10-CM | POA: Diagnosis not present

## 2020-03-23 LAB — MYOCARDIAL PERFUSION IMAGING
LV dias vol: 150 mL (ref 62–150)
LV sys vol: 75 mL
Peak HR: 92 {beats}/min
Rest HR: 71 {beats}/min
SDS: 0
SRS: 2
SSS: 2
TID: 1

## 2020-03-23 MED ORDER — TECHNETIUM TC 99M TETROFOSMIN IV KIT
31.5000 | PACK | Freq: Once | INTRAVENOUS | Status: AC | PRN
  Administered 2020-03-23: 31.5 via INTRAVENOUS
  Filled 2020-03-23: qty 32

## 2020-03-23 MED ORDER — TECHNETIUM TC 99M TETROFOSMIN IV KIT
10.8000 | PACK | Freq: Once | INTRAVENOUS | Status: AC | PRN
  Administered 2020-03-23: 10.8 via INTRAVENOUS
  Filled 2020-03-23: qty 11

## 2020-03-23 MED ORDER — REGADENOSON 0.4 MG/5ML IV SOLN
0.4000 mg | Freq: Once | INTRAVENOUS | Status: AC
  Administered 2020-03-23: 0.4 mg via INTRAVENOUS

## 2020-03-25 ENCOUNTER — Telehealth: Payer: Self-pay | Admitting: Cardiology

## 2020-03-25 DIAGNOSIS — E7800 Pure hypercholesterolemia, unspecified: Secondary | ICD-10-CM | POA: Diagnosis not present

## 2020-03-25 DIAGNOSIS — J1282 Pneumonia due to coronavirus disease 2019: Secondary | ICD-10-CM | POA: Diagnosis not present

## 2020-03-25 DIAGNOSIS — E785 Hyperlipidemia, unspecified: Secondary | ICD-10-CM | POA: Diagnosis not present

## 2020-03-25 DIAGNOSIS — I951 Orthostatic hypotension: Secondary | ICD-10-CM | POA: Diagnosis not present

## 2020-03-25 DIAGNOSIS — U071 COVID-19: Secondary | ICD-10-CM | POA: Diagnosis not present

## 2020-03-25 DIAGNOSIS — I4891 Unspecified atrial fibrillation: Secondary | ICD-10-CM | POA: Diagnosis not present

## 2020-03-25 DIAGNOSIS — M170 Bilateral primary osteoarthritis of knee: Secondary | ICD-10-CM | POA: Diagnosis not present

## 2020-03-25 DIAGNOSIS — N183 Chronic kidney disease, stage 3 unspecified: Secondary | ICD-10-CM | POA: Diagnosis not present

## 2020-03-25 DIAGNOSIS — I1310 Hypertensive heart and chronic kidney disease without heart failure, with stage 1 through stage 4 chronic kidney disease, or unspecified chronic kidney disease: Secondary | ICD-10-CM | POA: Diagnosis not present

## 2020-03-25 NOTE — Telephone Encounter (Signed)
Kevin Merritt is calling requesting to speak with a nurse in regards to a question about his recent test. Please advise.

## 2020-03-25 NOTE — Telephone Encounter (Signed)
Spoke to patient in regards to recent testing.   He would like sooner follow up to review and discuss medications.    VV scheduled with Almyra Deforest PA 11/23 to discuss further.     Nuc 11/10-negative for ischemia and no further workup per Dr. Percival Spanish

## 2020-03-31 DIAGNOSIS — I951 Orthostatic hypotension: Secondary | ICD-10-CM | POA: Diagnosis not present

## 2020-03-31 DIAGNOSIS — E7800 Pure hypercholesterolemia, unspecified: Secondary | ICD-10-CM | POA: Diagnosis not present

## 2020-03-31 DIAGNOSIS — J1282 Pneumonia due to coronavirus disease 2019: Secondary | ICD-10-CM | POA: Diagnosis not present

## 2020-03-31 DIAGNOSIS — I4891 Unspecified atrial fibrillation: Secondary | ICD-10-CM | POA: Diagnosis not present

## 2020-03-31 DIAGNOSIS — I1310 Hypertensive heart and chronic kidney disease without heart failure, with stage 1 through stage 4 chronic kidney disease, or unspecified chronic kidney disease: Secondary | ICD-10-CM | POA: Diagnosis not present

## 2020-03-31 DIAGNOSIS — N183 Chronic kidney disease, stage 3 unspecified: Secondary | ICD-10-CM | POA: Diagnosis not present

## 2020-03-31 DIAGNOSIS — M170 Bilateral primary osteoarthritis of knee: Secondary | ICD-10-CM | POA: Diagnosis not present

## 2020-03-31 DIAGNOSIS — E785 Hyperlipidemia, unspecified: Secondary | ICD-10-CM | POA: Diagnosis not present

## 2020-03-31 DIAGNOSIS — U071 COVID-19: Secondary | ICD-10-CM | POA: Diagnosis not present

## 2020-04-04 DIAGNOSIS — U071 COVID-19: Secondary | ICD-10-CM | POA: Diagnosis not present

## 2020-04-04 DIAGNOSIS — N183 Chronic kidney disease, stage 3 unspecified: Secondary | ICD-10-CM | POA: Diagnosis not present

## 2020-04-04 DIAGNOSIS — I4891 Unspecified atrial fibrillation: Secondary | ICD-10-CM | POA: Diagnosis not present

## 2020-04-04 DIAGNOSIS — E7800 Pure hypercholesterolemia, unspecified: Secondary | ICD-10-CM | POA: Diagnosis not present

## 2020-04-04 DIAGNOSIS — E785 Hyperlipidemia, unspecified: Secondary | ICD-10-CM | POA: Diagnosis not present

## 2020-04-04 DIAGNOSIS — J1282 Pneumonia due to coronavirus disease 2019: Secondary | ICD-10-CM | POA: Diagnosis not present

## 2020-04-04 DIAGNOSIS — I951 Orthostatic hypotension: Secondary | ICD-10-CM | POA: Diagnosis not present

## 2020-04-04 DIAGNOSIS — I1310 Hypertensive heart and chronic kidney disease without heart failure, with stage 1 through stage 4 chronic kidney disease, or unspecified chronic kidney disease: Secondary | ICD-10-CM | POA: Diagnosis not present

## 2020-04-04 DIAGNOSIS — M170 Bilateral primary osteoarthritis of knee: Secondary | ICD-10-CM | POA: Diagnosis not present

## 2020-04-05 ENCOUNTER — Other Ambulatory Visit: Payer: Self-pay | Admitting: Physician Assistant

## 2020-04-05 ENCOUNTER — Telehealth (INDEPENDENT_AMBULATORY_CARE_PROVIDER_SITE_OTHER): Payer: Medicare HMO | Admitting: Physician Assistant

## 2020-04-05 ENCOUNTER — Encounter: Payer: Self-pay | Admitting: Physician Assistant

## 2020-04-05 ENCOUNTER — Telehealth: Payer: Self-pay | Admitting: Physician Assistant

## 2020-04-05 VITALS — BP 141/76 | HR 86 | Ht 77.0 in | Wt 258.0 lb

## 2020-04-05 DIAGNOSIS — N183 Chronic kidney disease, stage 3 unspecified: Secondary | ICD-10-CM | POA: Diagnosis not present

## 2020-04-05 DIAGNOSIS — I712 Thoracic aortic aneurysm, without rupture, unspecified: Secondary | ICD-10-CM

## 2020-04-05 DIAGNOSIS — E785 Hyperlipidemia, unspecified: Secondary | ICD-10-CM | POA: Diagnosis not present

## 2020-04-05 DIAGNOSIS — I472 Ventricular tachycardia: Secondary | ICD-10-CM | POA: Diagnosis not present

## 2020-04-05 DIAGNOSIS — I10 Essential (primary) hypertension: Secondary | ICD-10-CM | POA: Diagnosis not present

## 2020-04-05 DIAGNOSIS — I4892 Unspecified atrial flutter: Secondary | ICD-10-CM

## 2020-04-05 DIAGNOSIS — I4729 Other ventricular tachycardia: Secondary | ICD-10-CM

## 2020-04-05 MED ORDER — DILTIAZEM HCL ER COATED BEADS 300 MG PO CP24: 300.0000 mg | ORAL_CAPSULE | Freq: Every day | ORAL | 3 refills | Status: DC

## 2020-04-05 NOTE — Telephone Encounter (Signed)
Patient forgot to ask at his appointment today if he needs to continue wearing his stockings. Please advise

## 2020-04-05 NOTE — Telephone Encounter (Signed)
Spoke to patient, he states he forgot to ask if he needs to continue wearing his compression stockings at his VV today with Almyra Deforest PA.   He states they were intially put on to prevent DVT in the hospital.      Per chart review:  Hx of orthostatic hypotension/dizziness-patient states this has resolved and is no longer an issue.   He also denies any swelling or edema.   Advised ok to try without them if uncomfortable.  If he starts having any edema or orthostatic dizziness, advised to put on to see if improved.     Patient verbalized understanding.

## 2020-04-05 NOTE — Progress Notes (Signed)
Virtual Visit via Telephone Note   This visit type was conducted due to national recommendations for restrictions regarding the COVID-19 Pandemic (e.g. social distancing) in an effort to limit this patient's exposure and mitigate transmission in our community.  Due to his co-morbid illnesses, this patient is at least at moderate risk for complications without adequate follow up.  This format is felt to be most appropriate for this patient at this time.  The patient did not have access to video technology/had technical difficulties with video requiring transitioning to audio format only (telephone).  All issues noted in this document were discussed and addressed.  No physical exam could be performed with this format.  Please refer to the patient's chart for his  consent to telehealth for [facility].    Date:  04/07/2020   ID:  Kevin Merritt, DOB April 17, 1951, MRN [id number] The patient was identified using 2 identifiers.  Patient Location: Home Provider Location: Home Office  PCP:  [name], MD  Cardiologist:  Minus Breeding, MD  Electrophysiologist:  None   Evaluation Performed:  Follow-Up Visit  Chief Complaint:  Follow up  History of Present Illness:    Kevin Merritt is a 69 y.o. male with past medical history of hypertension, hyperlipidemia, stage III CKD, TAA, recent COVID 59, A. fib and syncope.  Previous Myoview obtained in November 2015 was low risk.  Echocardiogram obtained in 2015 showed EF 65 to 70%, grade 1 DD, trivial AI.  Patient was admitted to the hospital in September with increased dizziness and syncope.  EMS noted he was in new atrial fibrillation with RVR.  On arrival, his orthostatic vital sign was positive with systolic blood pressure dropping from 130 down to 84 with changing the body positions.  He was also positive for COVID-19 as well.  CTA of the chest obtained on 02/01/2020 showed no PE, severe left ventricular hypertrophy, 4.3 cm ascending aortic  aneurysm.  Limited echocardiogram obtained on 02/03/2020 showed EF 60 to 65%, moderate LVH, grade 2 DD, normal RVEF and RV size, trivial mitral regurgitation mild AI.  Afterward, he wore a heart monitor the showed 6-second run of nonsustained VT, underlying rhythm was sinus rhythm. Given the nonsustained VT, a Myoview was ordered.  Myoview performed on 03/23/2020 showed EF 50%, no evidence of ischemia or infarction, overall low risk test.  Talking with the patient today, his blood pressure still borderline elevated.  Given the recently seen nonsustained VT and atrial flutter on the heart monitor, I decided to increase his diltiazem CD to 300 mg daily.  Given the presence of atrial flutter, we will continue him on the Eliquis.  Otherwise, he denies any recent chest pain or shortness of breath.  He does not have any prior history of acid reflux and would like to come off of Protonix.  He is aware that I have ordered a CBC last month, this has not been done.  He can do this CBC anytime prior to the next office visit with Dr. Percival Spanish.  This is to make sure he does not have any worsening anemia on the Eliquis.  Otherwise he can follow-up with Dr. Percival Spanish as previously scheduled in January.  The patient does not have symptoms concerning for COVID-19 infection (fever, chills, cough, or new shortness of breath).    Past Medical History:  Diagnosis Date  . CKD (chronic kidney disease), stage III ([place])   . High cholesterol   . Hypertension   . Prostate cancer ([place])  Past Surgical History:  Procedure Laterality Date  . COLONOSCOPY W/ POLYPECTOMY  2012  . HERNIA REPAIR Right    Age 89  . NM MYOVIEW LTD  10/15/2013   7 min, 8.5 METs, 57%, ASx ST depressions - No ischemia / infarction.  . THYROIDECTOMY     Age 39  . TONSILLECTOMY     Age 68  . TRANSTHORACIC ECHOCARDIOGRAM  10/28/2013   Nl LV Size & function; EF 65-70%, mod Conc LVH with Gd 2 DD     Current Meds  Medication Sig  . albuterol (VENTOLIN  HFA) 108 (90 Base) MCG/ACT inhaler   . atorvastatin (LIPITOR) 20 MG tablet Take 20 mg by mouth daily.  Marland Kitchen diltiazem (CARDIZEM CD) 300 MG 24 hr capsule Take 1 capsule (300 mg total) by mouth daily.  Marland Kitchen ELIQUIS 5 MG TABS tablet   . leuprolide, 6 Month, (ELIGARD) 45 MG injection Inject 45 mg into the skin every 6 (six) months.  . tamsulosin (FLOMAX) 0.4 MG CAPS capsule Take 1 capsule (0.4 mg total) by mouth daily after supper.  . [DISCONTINUED] diltiazem (CARDIZEM CD) 240 MG 24 hr capsule Take 1 capsule (240 mg total) by mouth daily.  . [DISCONTINUED] pantoprazole (PROTONIX) 40 MG tablet Take 1 tablet (40 mg total) by mouth daily.     Allergies:   Shellfish allergy   Social History   Tobacco Use  . Smoking status: Never Smoker  . Smokeless tobacco: Never Used  Substance Use Topics  . Alcohol use: No  . Drug use: No     Family Hx: The patient's family history includes Diabetes (age of onset: 50) in his sister; Heart failure (age of onset: 79) in his mother; Hypertension in his mother and sister.  ROS:   Please see the history of present illness.     All other systems reviewed and are negative.   Prior CV studies:   The following studies were reviewed today:  Echo 02/03/2020 1. Left ventricular ejection fraction, by estimation, is 60 to 65%. The  left ventricle has normal function. The left ventricle has no regional  wall motion abnormalities. There is moderate concentric left ventricular  hypertrophy. Left ventricular  diastolic parameters are consistent with Grade II diastolic dysfunction  (pseudonormalization).  2. Right ventricular systolic function is normal. The right ventricular  size is normal.  3. Trivial mitral valve regurgitation.  4. There is mild calcification of the aortic valve. There is mild  thickening of the aortic valve. Aortic valve regurgitation is mild. Mild  aortic valve sclerosis is present, with no evidence of aortic valve  stenosis.   Labs/Other  Tests and Data Reviewed:    EKG:  An ECG dated 02/03/2020 was personally reviewed today and demonstrated:  Atrial fibrillation with RVR  Recent Labs: 02/03/2020: B Natriuretic Peptide 147.2; TSH 1.459 02/07/2020: ALT 57; Hemoglobin 11.3; Platelets 179 03/18/2020: BUN 10; Creatinine, Ser 1.28; Magnesium 2.1; Potassium 4.9; Sodium 142   Recent Lipid Panel No results found for: CHOL, TRIG, HDL, CHOLHDL, LDLCALC, LDLDIRECT  Wt Readings from Last 3 Encounters:  04/05/20 258 lb (117 kg)  03/23/20 258 lb (117 kg)  03/18/20 258 lb 12.8 oz (117.4 kg)     Risk Assessment/Calculations:     CHA2DS2-VASc Score = 2  This indicates a 2.2% annual risk of stroke. The patient's score is based upon: CHF History: 0 HTN History: 1 Diabetes History: 0 Stroke History: 0 Vascular Disease History: 0 Age Score: 1 Gender Score: 0  Objective:    Vital Signs:  BP (!) 141/76   Pulse 86   Ht 6\' 5"  (1.956 m)   Wt 258 lb (117 kg)   SpO2 99%   BMI 30.59 kg/m    VITAL SIGNS:  reviewed  ASSESSMENT & PLAN:    1. PAF/atrial flutter: Patient had atrial fibrillation during the recent hospitalization, since discharge, heart monitor continue to show intermittent atrial flutter.  I recommended continue Eliquis 5 mg twice a day.  2. Nonsustained VT: intermittent episode of nonsustained VT on the recent heart monitor.  I will increase his diltiazem to 300 mg daily.  3. Hypertension: Blood pressure mildly elevated, increase diltiazem CD to 300 mg daily  4. Hyperlipidemia: Continue Lipitor  5. CKD stage III: Stable on recent lab work  6. Thoracic aortic aneurysm: Measuring 4.3 cm on the recent CTA obtained on 02/01/2020.   Shared Decision Making/Informed Consent        COVID-19 Education: The signs and symptoms of COVID-19 were discussed with the patient and how to seek care for testing (follow up with PCP or arrange E-visit).  The importance of social distancing was discussed today.  Time:    Today, I have spent 28 minutes with the patient with telehealth technology discussing the above problems.     Medication Adjustments/Labs and Tests Ordered: Current medicines are reviewed at length with the patient today.  Concerns regarding medicines are outlined above.   Tests Ordered: No orders of the defined types were placed in this encounter.   Medication Changes: Meds ordered this encounter  Medications  . diltiazem (CARDIZEM CD) 300 MG 24 hr capsule    Sig: Take 1 capsule (300 mg total) by mouth daily.    Dispense:  90 capsule    Refill:  3    New Rx dose change    Follow Up:  In Person in 2 month(s)  Signed, Almyra Deforest, [place]  04/07/2020 11:28 PM    [place]

## 2020-04-05 NOTE — Patient Instructions (Signed)
Medication Instructions:   INCREASE Diltiazem CD to 300 mg daily  STOP Pantoprazole  *If you need a refill on your cardiac medications before your next appointment, please call your pharmacy*  Lab Work: Complete blood work ordered during October 2021 Virtual Visit:  CBC If you have labs (blood work) drawn today and your tests are completely normal, you will receive your results only by: Marland Kitchen MyChart Message (if you have MyChart) OR . A paper copy in the mail If you have any lab test that is abnormal or we need to change your treatment, we will call you to review the results.  Testing/Procedures: NONE ordered at this time of appointment   Follow-Up: At [facility], you and your health needs are our priority.  As part of our continuing mission to provide you with exceptional heart care, we have created designated Provider Care Teams.  These Care Teams include your primary Cardiologist (physician) and Advanced Practice Providers (APPs -  Physician Assistants and Nurse Practitioners) who all work together to provide you with the care you need, when you need it.  Your next appointment:   Follow up as scheduled   The format for your next appointment:   In Person  Provider:   Minus Breeding, MD   Other Instructions

## 2020-04-14 DIAGNOSIS — C61 Malignant neoplasm of prostate: Secondary | ICD-10-CM | POA: Diagnosis not present

## 2020-04-15 DIAGNOSIS — D649 Anemia, unspecified: Secondary | ICD-10-CM | POA: Diagnosis not present

## 2020-04-15 LAB — CBC
Hematocrit: 31.8 % — ABNORMAL LOW (ref 37.5–51.0)
Hemoglobin: 10.8 g/dL — ABNORMAL LOW (ref 13.0–17.7)
MCH: 29.7 pg (ref 26.6–33.0)
MCHC: 34 g/dL (ref 31.5–35.7)
MCV: 87 fL (ref 79–97)
Platelets: 303 10*3/uL (ref 150–450)
RBC: 3.64 x10E6/uL — ABNORMAL LOW (ref 4.14–5.80)
RDW: 13.6 % (ref 11.6–15.4)
WBC: 4.2 10*3/uL (ref 3.4–10.8)

## 2020-04-21 DIAGNOSIS — Z5111 Encounter for antineoplastic chemotherapy: Secondary | ICD-10-CM | POA: Diagnosis not present

## 2020-04-21 DIAGNOSIS — N401 Enlarged prostate with lower urinary tract symptoms: Secondary | ICD-10-CM | POA: Diagnosis not present

## 2020-04-21 DIAGNOSIS — R351 Nocturia: Secondary | ICD-10-CM | POA: Diagnosis not present

## 2020-04-21 DIAGNOSIS — C61 Malignant neoplasm of prostate: Secondary | ICD-10-CM | POA: Diagnosis not present

## 2020-05-10 DIAGNOSIS — E7800 Pure hypercholesterolemia, unspecified: Secondary | ICD-10-CM | POA: Diagnosis not present

## 2020-05-10 DIAGNOSIS — I4891 Unspecified atrial fibrillation: Secondary | ICD-10-CM | POA: Diagnosis not present

## 2020-05-10 DIAGNOSIS — E782 Mixed hyperlipidemia: Secondary | ICD-10-CM | POA: Diagnosis not present

## 2020-05-10 DIAGNOSIS — C61 Malignant neoplasm of prostate: Secondary | ICD-10-CM | POA: Diagnosis not present

## 2020-05-10 DIAGNOSIS — I480 Paroxysmal atrial fibrillation: Secondary | ICD-10-CM | POA: Diagnosis not present

## 2020-05-10 DIAGNOSIS — I10 Essential (primary) hypertension: Secondary | ICD-10-CM | POA: Diagnosis not present

## 2020-05-10 DIAGNOSIS — N182 Chronic kidney disease, stage 2 (mild): Secondary | ICD-10-CM | POA: Diagnosis not present

## 2020-05-19 NOTE — Progress Notes (Signed)
Cardiology Office Note   Date:  05/20/2020   ID:  Shohei Foglia, DOB [birth year], MRN [id number]  PCP:  [name], MD  Cardiologist:   Minus Breeding, MD   Chief Complaint  Patient presents with  . Atrial Fibrillation      History of Present Illness: Kevin Merritt is a 70 y.o. male who was previously seen by Dr. Ellyn Hack in 2015.  Myoview obtained in 10/2013 was low risk.  Echocardiogram at the time showed moderate LVH, EF 65 to 70%, grade 1 DD, trivial AI.  The patient was recently admitted to the hospital on 02/01/2020 with increasing dizziness and syncope.  EMS noted he was in new atrial fibrillation with RVR.  On arrival to the ED, he was also found to have positive orthostasis with systolic blood pressure dropping from 130 down to 84.  Lab work obtained in the ED revealed he was positive for COVID-19.   He has a thoracic aneurysm of 4.3 cm.   He wore a monitor and was found recently to have NSVT.  The rhythm was sinus.  There was sinus tachycardia.  There was some baseline artifact.  He did have a 6-second run of nonsustained ventricular tachycardia and other shorter runs.  Of note during his evaluation in the hospital he had an echocardiogram which demonstrated an EF of 60 to 65%.  There was some moderate concentric left ventricular hypertrophy.  He wore a heart monitor the showed 6-second run of nonsustained VT, underlying rhythm was sinus rhythm. Given the nonsustained VT, a  Myoview was ordered.  Myoview performed on 03/23/2020 showed EF 50%, no evidence of ischemia or infarction, overall low risk test.    At the last visit he had his Cardizem increased.  He has not really noticed any palpitations.  He denies any cardiovascular symptoms.  He is not exercising as much as I would like but with his current level of activity he denies any chest discomfort, neck or arm discomfort.  He has no shortness of breath, PND or orthopnea.  Has had no presyncope or syncope.  He has had no weight  gain or edema.  He has had no problems taking the higher dose of Cardizem.   Past Medical History:  Diagnosis Date  . CKD (chronic kidney disease), stage III ([place])   . High cholesterol   . Hypertension   . Prostate cancer [facility])     Past Surgical History:  Procedure Laterality Date  . COLONOSCOPY W/ POLYPECTOMY  2012  . HERNIA REPAIR Right    Age 49  . NM MYOVIEW LTD  10/15/2013   7 min, 8.5 METs, 57%, ASx ST depressions - No ischemia / infarction.  . THYROIDECTOMY     Age 63  . TONSILLECTOMY     Age 31  . TRANSTHORACIC ECHOCARDIOGRAM  10/28/2013   Nl LV Size & function; EF 65-70%, mod Conc LVH with Gd 2 DD     Current Outpatient Medications  Medication Sig Dispense Refill  . albuterol (VENTOLIN HFA) 108 (90 Base) MCG/ACT inhaler     . aspirin EC 81 MG tablet Take 81 mg by mouth daily. Swallow whole.    Marland Kitchen atorvastatin (LIPITOR) 20 MG tablet Take 20 mg by mouth daily.    Marland Kitchen diltiazem (CARDIZEM CD) 300 MG 24 hr capsule Take 1 capsule (300 mg total) by mouth daily. 90 capsule 3  . leuprolide, 6 Month, (ELIGARD) 45 MG injection Inject 45 mg into the skin every 6 (six) months.    Marland Kitchen  tamsulosin (FLOMAX) 0.4 MG CAPS capsule Take 1 capsule (0.4 mg total) by mouth daily after supper. 30 capsule 0   No current facility-administered medications for this visit.    Allergies:   Shellfish allergy    ROS:  Please see the history of present illness.   Otherwise, review of systems are positive for none.   All other systems are reviewed and negative.    PHYSICAL EXAM: VS:  BP 128/62 (BP Location: Left Arm, Patient Position: Sitting, Cuff Size: Large)   Pulse 78   Ht 6\' 5"  (1.956 m)   Wt 256 lb (116.1 kg)   BMI 30.36 kg/m  , BMI Body mass index is 30.36 kg/m. GENERAL:  Well appearing NECK:  No jugular venous distention, waveform within normal limits, carotid upstroke brisk and symmetric, no bruits, no thyromegaly LUNGS:  Clear to auscultation bilaterally CHEST:  Unremarkable HEART:   PMI not displaced or sustained,S1 and S2 within normal limits, no S3, no S4, no clicks, no rubs, soft apical systolic murmur radiating slightly to the left upper sternal border, no diastolic murmurs ABD:  Flat, positive bowel sounds normal in frequency in pitch, no bruits, no rebound, no guarding, no midline pulsatile mass, no hepatomegaly, no splenomegaly EXT:  2 plus pulses throughout, no edema, no cyanosis no clubbing   EKG:  EKG is  ordered today. Sinus rhythm, rate 78, axis within normal limits, intervals within normal limits, inferior T wave inversions unchanged from previous except for the fact that that was fibrillation when he was in the emergency room  Recent Labs: 02/03/2020: B Natriuretic Peptide 147.2; TSH 1.459 02/07/2020: ALT 57 03/18/2020: BUN 10; Creatinine, Ser 1.28; Magnesium 2.1; Potassium 4.9; Sodium 142 04/15/2020: Hemoglobin 10.8; Platelets 303    Lipid Panel No results found for: CHOL, TRIG, HDL, CHOLHDL, VLDL, LDLCALC, LDLDIRECT    Wt Readings from Last 3 Encounters:  05/20/20 256 lb (116.1 kg)  04/05/20 258 lb (117 kg)  03/23/20 258 lb (117 kg)      Other studies Reviewed: Additional studies/ records that were reviewed today include:  Labs Review of the above records demonstrates:  Please see elsewhere in the note.     ASSESSMENT AND PLAN:  Paroxysmal atrial fibrillation/flutter:   He is not had any symptomatic recurrence.  He was in fibrillation only when he was in the hospital with Covid.  Given the absence of any recurrence I think it is most prudent to stop the Eliquis and just start aspirin 81 mg daily.  He can follow his own heart rate with a pulse oximeter and will alert me to any rapid rates that could be fibrillation.   Hypertension:  The BP is controlled.  No change in therapy.   MURMUR: He did have a slight thickening of his aortic valve that is likely because of his murmur on echo.  He had some mild LVH.  I will follow this  clinically.  Hyperlipidemia:    He is currently on Lipitor.  I will defer to [name], MD   CKD stage III:   Creat was 1.28 November which was down from 1.38.    NSVT:  He is on the Cardizem as above.  No change in therapy.  Thoracic aortic aneurysm:   This was 4.3 in Sept and I will repeat in one year.   Current medicines are reviewed at length with the patient today.  The patient does not have concerns regarding medicines.  The following changes have been made: As above  Labs/ tests ordered today include:   Orders Placed This Encounter  Procedures  . EKG 12-Lead     Disposition:   FU with me in 1 year   Signed, Rollene Rotunda, MD  05/20/2020 12:00 PM    [facility]

## 2020-05-20 ENCOUNTER — Ambulatory Visit (INDEPENDENT_AMBULATORY_CARE_PROVIDER_SITE_OTHER): Payer: Medicare HMO | Admitting: Cardiology

## 2020-05-20 ENCOUNTER — Encounter: Payer: Self-pay | Admitting: Cardiology

## 2020-05-20 ENCOUNTER — Other Ambulatory Visit: Payer: Self-pay

## 2020-05-20 VITALS — BP 128/62 | HR 78 | Ht 77.0 in | Wt 256.0 lb

## 2020-05-20 DIAGNOSIS — E785 Hyperlipidemia, unspecified: Secondary | ICD-10-CM | POA: Diagnosis not present

## 2020-05-20 DIAGNOSIS — I10 Essential (primary) hypertension: Secondary | ICD-10-CM

## 2020-05-20 DIAGNOSIS — N1831 Chronic kidney disease, stage 3a: Secondary | ICD-10-CM | POA: Diagnosis not present

## 2020-05-20 DIAGNOSIS — I480 Paroxysmal atrial fibrillation: Secondary | ICD-10-CM

## 2020-05-20 DIAGNOSIS — I4729 Other ventricular tachycardia: Secondary | ICD-10-CM

## 2020-05-20 DIAGNOSIS — I472 Ventricular tachycardia: Secondary | ICD-10-CM

## 2020-05-20 NOTE — Patient Instructions (Signed)
Medication Instructions:  STOP ELIQUIS   START ASPIRIN 81 MG DAILY   *If you need a refill on your cardiac medications before your next appointment, please call your pharmacy*  Lab Work: NONE   Testing/Procedures: NONE  Follow-Up: At [facility], you and your health needs are our priority.  As part of our continuing mission to provide you with exceptional heart care, we have created designated Provider Care Teams.  These Care Teams include your primary Cardiologist (physician) and Advanced Practice Providers (APPs -  Physician Assistants and Nurse Practitioners) who all work together to provide you with the care you need, when you need it.  We recommend signing up for the patient portal called "MyChart".  Sign up information is provided on this After Visit Summary.  MyChart is used to connect with patients for Virtual Visits (Telemedicine).  Patients are able to view lab/test results, encounter notes, upcoming appointments, etc.  Non-urgent messages can be sent to your provider as well.   To learn more about what you can do with MyChart, go to [email or web address].    Your next appointment:   12 month(s) You will receive a reminder letter in the mail two months in advance. If you don't receive a letter, please call our office to schedule the follow-up appointment.  The format for your next appointment:   In Person  Provider:   You may see Minus Breeding, MD or one of the following Advanced Practice Providers on your designated Care Team:    Rosaria Ferries, PA-C  Jory Sims, DNP, ANP

## 2020-06-07 DIAGNOSIS — N182 Chronic kidney disease, stage 2 (mild): Secondary | ICD-10-CM | POA: Diagnosis not present

## 2020-06-07 DIAGNOSIS — E7800 Pure hypercholesterolemia, unspecified: Secondary | ICD-10-CM | POA: Diagnosis not present

## 2020-06-07 DIAGNOSIS — E782 Mixed hyperlipidemia: Secondary | ICD-10-CM | POA: Diagnosis not present

## 2020-06-07 DIAGNOSIS — C61 Malignant neoplasm of prostate: Secondary | ICD-10-CM | POA: Diagnosis not present

## 2020-06-07 DIAGNOSIS — I10 Essential (primary) hypertension: Secondary | ICD-10-CM | POA: Diagnosis not present

## 2020-06-07 DIAGNOSIS — I480 Paroxysmal atrial fibrillation: Secondary | ICD-10-CM | POA: Diagnosis not present

## 2020-06-07 DIAGNOSIS — I4891 Unspecified atrial fibrillation: Secondary | ICD-10-CM | POA: Diagnosis not present

## 2020-06-16 DIAGNOSIS — H524 Presbyopia: Secondary | ICD-10-CM | POA: Diagnosis not present

## 2020-07-01 DIAGNOSIS — Z9889 Other specified postprocedural states: Secondary | ICD-10-CM | POA: Diagnosis not present

## 2020-07-01 DIAGNOSIS — R0601 Orthopnea: Secondary | ICD-10-CM | POA: Diagnosis not present

## 2020-07-01 DIAGNOSIS — I10 Essential (primary) hypertension: Secondary | ICD-10-CM | POA: Diagnosis not present

## 2020-07-01 DIAGNOSIS — Z8719 Personal history of other diseases of the digestive system: Secondary | ICD-10-CM | POA: Diagnosis not present

## 2020-07-01 DIAGNOSIS — R1030 Lower abdominal pain, unspecified: Secondary | ICD-10-CM | POA: Diagnosis not present

## 2020-07-04 ENCOUNTER — Telehealth: Payer: Self-pay | Admitting: Cardiology

## 2020-07-04 DIAGNOSIS — I4891 Unspecified atrial fibrillation: Secondary | ICD-10-CM | POA: Diagnosis not present

## 2020-07-04 DIAGNOSIS — E782 Mixed hyperlipidemia: Secondary | ICD-10-CM | POA: Diagnosis not present

## 2020-07-04 DIAGNOSIS — E7800 Pure hypercholesterolemia, unspecified: Secondary | ICD-10-CM | POA: Diagnosis not present

## 2020-07-04 DIAGNOSIS — N182 Chronic kidney disease, stage 2 (mild): Secondary | ICD-10-CM | POA: Diagnosis not present

## 2020-07-04 DIAGNOSIS — I10 Essential (primary) hypertension: Secondary | ICD-10-CM | POA: Diagnosis not present

## 2020-07-04 DIAGNOSIS — C61 Malignant neoplasm of prostate: Secondary | ICD-10-CM | POA: Diagnosis not present

## 2020-07-04 DIAGNOSIS — I480 Paroxysmal atrial fibrillation: Secondary | ICD-10-CM | POA: Diagnosis not present

## 2020-07-04 NOTE — Telephone Encounter (Signed)
*  STAT* If patient is at the pharmacy, call can be transferred to refill team.   1. Which medications need to be refilled? (please list name of each medication and dose if known) diltiazem (CARDIZEM CD) 300 MG 24 hr capsule  2. Which pharmacy/location (including street and city if local pharmacy) is medication to be sent to? [facility] - [place], [place] - [place] [address]  3. Do they need a 30 day or 90 day supply? 90 day

## 2020-07-05 MED ORDER — DILTIAZEM HCL ER COATED BEADS 300 MG PO CP24
300.0000 mg | ORAL_CAPSULE | Freq: Every day | ORAL | 3 refills | Status: DC
Start: 2020-07-05 — End: 2020-10-06

## 2020-07-06 DIAGNOSIS — Z8719 Personal history of other diseases of the digestive system: Secondary | ICD-10-CM | POA: Diagnosis not present

## 2020-07-06 DIAGNOSIS — R1030 Lower abdominal pain, unspecified: Secondary | ICD-10-CM | POA: Diagnosis not present

## 2020-07-06 DIAGNOSIS — R109 Unspecified abdominal pain: Secondary | ICD-10-CM | POA: Diagnosis not present

## 2020-07-11 DIAGNOSIS — I10 Essential (primary) hypertension: Secondary | ICD-10-CM | POA: Diagnosis not present

## 2020-07-21 DIAGNOSIS — I712 Thoracic aortic aneurysm, without rupture: Secondary | ICD-10-CM | POA: Diagnosis not present

## 2020-07-21 DIAGNOSIS — R600 Localized edema: Secondary | ICD-10-CM | POA: Diagnosis not present

## 2020-07-21 DIAGNOSIS — E8770 Fluid overload, unspecified: Secondary | ICD-10-CM | POA: Diagnosis not present

## 2020-07-21 DIAGNOSIS — I480 Paroxysmal atrial fibrillation: Secondary | ICD-10-CM | POA: Diagnosis not present

## 2020-07-21 DIAGNOSIS — E7800 Pure hypercholesterolemia, unspecified: Secondary | ICD-10-CM | POA: Diagnosis not present

## 2020-07-21 DIAGNOSIS — I10 Essential (primary) hypertension: Secondary | ICD-10-CM | POA: Diagnosis not present

## 2020-07-21 DIAGNOSIS — C61 Malignant neoplasm of prostate: Secondary | ICD-10-CM | POA: Diagnosis not present

## 2020-07-22 DIAGNOSIS — C61 Malignant neoplasm of prostate: Secondary | ICD-10-CM | POA: Diagnosis not present

## 2020-07-28 NOTE — Telephone Encounter (Signed)
I have called and spoke with Kevin Merritt, blood work from last Thursday shows sodium 142, potassium 4.4, creatinine 0.96, BUN 12, CO2 34.  At this time, I recommend continue with the diltiazem dosage.  He does have a history of orthostatic hypotension however this occurred in the setting of dehydration and COVID-19 infection last year.  So far he has not had any dizzy spell of feeling of passing out on the 40 mg daily of Lasix.  We discussed the importance of salt restriction and fluid restriction.  If swelling continues despite leg elevation, salt restriction and daily Lasix, he has been instructed to contact us and we can try to work him in in the future.  Otherwise he is scheduled to see [name] back tomorrow.

## 2020-07-28 NOTE — Telephone Encounter (Signed)
Spoke with pt and pt experienced some swelling and made appt with  Dr Delfina Redwood whom ordered  blood work and what sounds like pt having  elevated BNP and pt  was started on Furosemide 40 mg on 07/22/20 Per pt swelling is not as bad but does note more in right foot than left .Pt was wandering if swelling could be coming from Diltiazem  Per pt HR was 88 and B/P was 150/70 Pt would like a phone call Will forward  Message to [place]

## 2020-07-29 DIAGNOSIS — R351 Nocturia: Secondary | ICD-10-CM | POA: Diagnosis not present

## 2020-07-29 DIAGNOSIS — C61 Malignant neoplasm of prostate: Secondary | ICD-10-CM | POA: Diagnosis not present

## 2020-08-02 DIAGNOSIS — I480 Paroxysmal atrial fibrillation: Secondary | ICD-10-CM | POA: Diagnosis not present

## 2020-08-02 DIAGNOSIS — R600 Localized edema: Secondary | ICD-10-CM | POA: Diagnosis not present

## 2020-08-25 DIAGNOSIS — R600 Localized edema: Secondary | ICD-10-CM | POA: Diagnosis not present

## 2020-08-25 DIAGNOSIS — I480 Paroxysmal atrial fibrillation: Secondary | ICD-10-CM | POA: Diagnosis not present

## 2020-09-05 DIAGNOSIS — M2041 Other hammer toe(s) (acquired), right foot: Secondary | ICD-10-CM | POA: Diagnosis not present

## 2020-09-05 DIAGNOSIS — B351 Tinea unguium: Secondary | ICD-10-CM | POA: Diagnosis not present

## 2020-09-05 DIAGNOSIS — B353 Tinea pedis: Secondary | ICD-10-CM | POA: Diagnosis not present

## 2020-09-05 DIAGNOSIS — I739 Peripheral vascular disease, unspecified: Secondary | ICD-10-CM | POA: Diagnosis not present

## 2020-09-16 ENCOUNTER — Telehealth: Payer: Self-pay | Admitting: Physician Assistant

## 2020-09-16 DIAGNOSIS — I10 Essential (primary) hypertension: Secondary | ICD-10-CM | POA: Diagnosis not present

## 2020-09-16 NOTE — Telephone Encounter (Signed)
I spoke with Mr. Kevin Merritt.  There is a question of accuracy with his blood pressure cuff reading, it appears his blood pressure cuff at home always read high however when he went to Dr. Lina Sar office today his blood pressure is actually 130/70 which is considered quite normal.  [name] has recommended the patient to get a [employer] blood pressure cuff.  As for his leg edema, I am not confident that he is leg edema is related to the diltiazem.  He was previously on combination of lisinopril and amlodipine before he was switched to diltiazem.  Amlodipine is likely to cause more leg swelling than the diltiazem.  I last increase his diltiazem CD to 300 mg daily in November 2021, his leg edema did not significantly increased on till March 2022.  We talked about salt restriction, fluid restriction between 32 to 64 ounces, leg elevation and compression stocking.  I would recommend follow-up in the next 2 to 3 weeks to reassess lower extremity edema in this case.  When he does purchase a new blood pressure cuff, he should also bring his cuff to the next office visit as well so we can calibrate it against the one we have in the office.  I will check with my staff to arrange follow-up.

## 2020-09-19 NOTE — Telephone Encounter (Signed)
Patient has appointment with Dr Percival Spanish 5/26

## 2020-09-19 NOTE — Telephone Encounter (Signed)
Message sent to [facility] scheduling pool to arrange follow up

## 2020-09-27 DIAGNOSIS — Z20822 Contact with and (suspected) exposure to covid-19: Secondary | ICD-10-CM | POA: Diagnosis not present

## 2020-10-05 DIAGNOSIS — M7989 Other specified soft tissue disorders: Secondary | ICD-10-CM | POA: Insufficient documentation

## 2020-10-05 HISTORY — DX: Other specified soft tissue disorders: M79.89

## 2020-10-05 NOTE — Progress Notes (Signed)
Cardiology Office Note   Date:  10/06/2020   ID:  Kevin Merritt, DOB 27-Nov-1950, MRN [id number]  PCP:  [name], MD  Cardiologist:   Minus Breeding, MD   Chief Complaint  Patient presents with  . Edema     History of Present Illness: Kevin Merritt is a 70 y.o. male who was previously seen by Dr. Ellyn Hack in 2015.  Myoview obtained in 10/2013 was low risk.  Echocardiogram at the time showed moderate LVH, EF 65 to 70%, grade 1 DD, trivial AI.  The patient was admitted to the hospital on 02/01/2020 with increasing dizziness and syncope.  EMS noted he was in new atrial fibrillation with RVR.  On arrival to the ED, he was also found to have positive orthostasis with systolic blood pressure dropping from 130 down to 84.  Lab work obtained in the ED revealed he was positive for COVID-19.   He has a thoracic aneurysm of 4.3 cm.   He wore a monitor and was found to have NSVT.  The rhythm was sinus.  There was sinus tachycardia.  There was some baseline artifact.  He did have a 6-second run of nonsustained ventricular tachycardia and other shorter runs.  Of note during his evaluation in the hospital he had an echocardiogram which demonstrated an EF of 60 to 65%.  There was some moderate concentric left ventricular hypertrophy.  He wore a heart monitor the showed 6-second run of nonsustained VT, underlying rhythm was sinus rhythm. Given the nonsustained VT, a  Myoview was ordered.  Myoview performed on 03/23/2020 showed EF 50%, no evidence of ischemia or infarction, overall low risk test.    He called recently because he wondered if his BP cuff at home was over estimating his BP.  Also he had some lower extremity edema.  He brought his blood pressure cuff today and actually does work well.  We verified that his readings are accurate and his systolic is elevated.  His swelling has not gotten better in his legs despite 40 mg of Lasix.  He does get some pain in his legs and has some diminished pulses  as addressed below.  The pain is sometimes at rest or when he walks.  Its more often in his feet and in his calves.  He is not describing any new shortness of breath, PND or orthopnea.  Has had no new palpitations, presyncope or syncope.  He has gained about 11 pounds and he does drink quite a bit of water and may not watch his salt as carefully as I would like.   Past Medical History:  Diagnosis Date  . CKD (chronic kidney disease), stage III ([place])   . High cholesterol   . Hypertension   . Prostate cancer [facility])     Past Surgical History:  Procedure Laterality Date  . COLONOSCOPY W/ POLYPECTOMY  2012  . HERNIA REPAIR Right    Age 58  . NM MYOVIEW LTD  10/15/2013   7 min, 8.5 METs, 57%, ASx ST depressions - No ischemia / infarction.  . THYROIDECTOMY     Age 89  . TONSILLECTOMY     Age 88  . TRANSTHORACIC ECHOCARDIOGRAM  10/28/2013   Nl LV Size & function; EF 65-70%, mod Conc LVH with Gd 2 DD     Current Outpatient Medications  Medication Sig Dispense Refill  . albuterol (VENTOLIN HFA) 108 (90 Base) MCG/ACT inhaler     . aspirin EC 81 MG tablet Take 81 mg  by mouth daily. Swallow whole.    Marland Kitchen atorvastatin (LIPITOR) 20 MG tablet Take 20 mg by mouth daily.    . furosemide (LASIX) 40 MG tablet     . leuprolide, 6 Month, (ELIGARD) 45 MG injection Inject 45 mg into the skin every 6 (six) months.    . tamsulosin (FLOMAX) 0.4 MG CAPS capsule Take 1 capsule (0.4 mg total) by mouth daily after supper. 30 capsule 0  . metoprolol (TOPROL XL) 200 MG 24 hr tablet Take 1 tablet (200 mg total) by mouth daily. 30 tablet 6   No current facility-administered medications for this visit.    Allergies:   Shellfish allergy    ROS:  Please see the history of present illness.   Otherwise, review of systems are positive for none.   All other systems are reviewed and negative.    PHYSICAL EXAM: VS:  BP (!) 170/82   Pulse 80   Ht 6\' 5"  (9.629 m)   Wt 267 lb 3.2 oz (121.2 kg)   SpO2 99%   BMI 31.69  kg/m  , BMI Body mass index is 31.69 kg/m. GENERAL:  Well appearing NECK:  No jugular venous distention, waveform within normal limits, carotid upstroke brisk and symmetric, no bruits, no thyromegaly LUNGS:  Clear to auscultation bilaterally CHEST:  Unremarkable HEART:  PMI not displaced or sustained,S1 and S2 within normal limits, no S3, no S4, no clicks, no rubs, brief apical systolic murmur radiating slightly to the left upper sternal border, no diastolic murmurs ABD:  Flat, positive bowel sounds normal in frequency in pitch, no bruits, no rebound, no guarding, no midline pulsatile mass, no hepatomegaly, no splenomegaly EXT:  2 plus pulses upper and diminished dorsalis pedis and posterior tibialis bilaterally, mild bilateral leg edema, no cyanosis no clubbing   EKG:  EKG is not ordered today.   Recent Labs: 02/03/2020: B Natriuretic Peptide 147.2; TSH 1.459 02/07/2020: ALT 57 03/18/2020: BUN 10; Creatinine, Ser 1.28; Magnesium 2.1; Potassium 4.9; Sodium 142 04/15/2020: Hemoglobin 10.8; Platelets 303    Lipid Panel No results found for: CHOL, TRIG, HDL, CHOLHDL, VLDL, LDLCALC, LDLDIRECT    Wt Readings from Last 3 Encounters:  10/06/20 267 lb 3.2 oz (121.2 kg)  05/20/20 256 lb (116.1 kg)  04/05/20 258 lb (117 kg)      Other studies Reviewed: Additional studies/ records that were reviewed today include:  Labs Review of the above records demonstrates:  Please see elsewhere in the note.     ASSESSMENT AND PLAN:  Edema:   Today I Minna switch his Cardizem to Toprol-XL 200 mg daily to see if this helps with his leg swelling.  In addition we had a long conversation about decreasing his water intake and we talked about salt in the food.  Paroxysmal atrial fibrillation/flutter:   He has had no symptoms consistent with paroxysms.  No change in therapy.  Hypertension:  The BP is not controlled.  I did make any changes above.    Hyperlipidemia:    He is currently on Lipitor.   I will  defer to [name], MD   CKD stage III:   Creat was 1.19 in March.  I will check this again today.  He might need diuretic added to his meds for better blood pressure control but I like to make sure he is stable renal function.   NSVT: This will be managed with a beta-blocker.   Thoracic aortic aneurysm:   This was 4.3 in September 2021 and  I will consider repeating this this fall.   Leg pain: I will check ABIs.  Current medicines are reviewed at length with the patient today.  The patient does not have concerns regarding medicines.  The following changes have been made:   As above  Labs/ tests ordered today include:     Orders Placed This Encounter  Procedures  . Basic metabolic panel  . VAS [place] ABI WITH/WO TBI     Disposition:   FU with me Almyra Deforest PA in 3 months.   Signed, Minus Breeding, MD  10/06/2020 5:58 PM    [facility]

## 2020-10-06 ENCOUNTER — Ambulatory Visit: Payer: Medicare HMO | Admitting: Cardiology

## 2020-10-06 ENCOUNTER — Other Ambulatory Visit: Payer: Self-pay

## 2020-10-06 ENCOUNTER — Encounter: Payer: Self-pay | Admitting: Cardiology

## 2020-10-06 VITALS — BP 170/82 | HR 80 | Ht 77.0 in | Wt 267.2 lb

## 2020-10-06 DIAGNOSIS — I712 Thoracic aortic aneurysm, without rupture, unspecified: Secondary | ICD-10-CM

## 2020-10-06 DIAGNOSIS — I472 Ventricular tachycardia: Secondary | ICD-10-CM | POA: Diagnosis not present

## 2020-10-06 DIAGNOSIS — I10 Essential (primary) hypertension: Secondary | ICD-10-CM

## 2020-10-06 DIAGNOSIS — I4729 Other ventricular tachycardia: Secondary | ICD-10-CM

## 2020-10-06 DIAGNOSIS — N183 Chronic kidney disease, stage 3 unspecified: Secondary | ICD-10-CM

## 2020-10-06 DIAGNOSIS — M7989 Other specified soft tissue disorders: Secondary | ICD-10-CM | POA: Diagnosis not present

## 2020-10-06 DIAGNOSIS — E785 Hyperlipidemia, unspecified: Secondary | ICD-10-CM

## 2020-10-06 MED ORDER — METOPROLOL SUCCINATE ER 200 MG PO TB24: 200.0000 mg | ORAL_TABLET | Freq: Every day | ORAL | 6 refills | Status: DC

## 2020-10-06 NOTE — Patient Instructions (Signed)
Medication Instructions:  STOP CARDIZEM START: METOPROLOL SUCCINATE (TOPROL XL) 200 MG DAILY *If you need a refill on your cardiac medications before your next appointment, please call your pharmacy*  Lab Work: BMET- TODAY  If you have labs (blood work) drawn today and your tests are completely normal, you will receive your results only by: Marland Kitchen MyChart Message (if you have MyChart) OR . A paper copy in the mail If you have any lab test that is abnormal or we need to change your treatment, we will call you to review the results.  Testing/Procedures: Your physician has requested that you have an ankle brachial index (ABI). During this test an ultrasound and blood pressure cuff are used to evaluate the arteries that supply the arms and legs with blood. Allow thirty minutes for this exam. There are no restrictions or special instructions. This will take place at [address], [address].   Follow-Up: At [facility], you and your health needs are our priority.  As part of our continuing mission to provide you with exceptional heart care, we have created designated Provider Care Teams.  These Care Teams include your primary Cardiologist (physician) and Advanced Practice Providers (APPs -  Physician Assistants and Nurse Practitioners) who all work together to provide you with the care you need, when you need it.  Your next appointment:   3 month(s)  The format for your next appointment:   In Person  Provider:   Almyra Deforest PA-C

## 2020-10-07 ENCOUNTER — Telehealth: Payer: Self-pay | Admitting: Cardiology

## 2020-10-07 LAB — BASIC METABOLIC PANEL
BUN/Creatinine Ratio: 14 (ref 10–24)
BUN: 13 mg/dL (ref 8–27)
CO2: 26 mmol/L (ref 20–29)
Calcium: 10 mg/dL (ref 8.6–10.2)
Chloride: 100 mmol/L (ref 96–106)
Creatinine, Ser: 0.95 mg/dL (ref 0.76–1.27)
Glucose: 82 mg/dL (ref 65–99)
Potassium: 4.4 mmol/L (ref 3.5–5.2)
Sodium: 141 mmol/L (ref 134–144)
eGFR: 85 mL/min/{1.73_m2} (ref >59)

## 2020-10-07 NOTE — Telephone Encounter (Signed)
New message:    Patient calling to about getting a recliner chair. Please call patient.

## 2020-10-07 NOTE — Telephone Encounter (Signed)
This RN returned patient's call, patient reports he forgot to ask Dr. Percival Spanish yesterday about whether a recliner would be an appropriate part of his management of swelling, and if he might be able to have a prescription written to have it covered by insurance. This RN told patient his message would be passed on to Dr. Percival Spanish for review. Pt verbalized understanding.

## 2020-10-09 NOTE — Telephone Encounter (Signed)
Medicare as far as I know does not pay for recliners. Yes, I would ask him to keep his feet elevated.  We can write the letter but I don't think that it will help.

## 2020-10-11 ENCOUNTER — Encounter: Payer: Self-pay | Admitting: *Deleted

## 2020-10-11 DIAGNOSIS — I480 Paroxysmal atrial fibrillation: Secondary | ICD-10-CM | POA: Diagnosis not present

## 2020-10-11 DIAGNOSIS — C61 Malignant neoplasm of prostate: Secondary | ICD-10-CM | POA: Diagnosis not present

## 2020-10-11 DIAGNOSIS — E7800 Pure hypercholesterolemia, unspecified: Secondary | ICD-10-CM | POA: Diagnosis not present

## 2020-10-11 DIAGNOSIS — I4891 Unspecified atrial fibrillation: Secondary | ICD-10-CM | POA: Diagnosis not present

## 2020-10-11 DIAGNOSIS — E782 Mixed hyperlipidemia: Secondary | ICD-10-CM | POA: Diagnosis not present

## 2020-10-11 DIAGNOSIS — N182 Chronic kidney disease, stage 2 (mild): Secondary | ICD-10-CM | POA: Diagnosis not present

## 2020-10-11 DIAGNOSIS — I10 Essential (primary) hypertension: Secondary | ICD-10-CM | POA: Diagnosis not present

## 2020-10-11 NOTE — Telephone Encounter (Signed)
Spoke with pt, aware of dr hochrein's recommendations. He wanted to let dr hochrin know since the change from diltiazem to metoprolol he is having increased SOB. He told the first dose of metoprolol Friday and by Saturday he noticed the SOB. This can occur with exertion or at rest. He reports he is having to use his inhaler more than he has in the past and it will only last a short period of time. He has not noticed any racing or skipping of his heart. Aware dr Percival Spanish is not in the office but will forward to him to review. Aware letter will be generated and mailed to his home address.

## 2020-10-11 NOTE — Telephone Encounter (Signed)
He can stop his metoprolol and resume Cardizem at 240 mg daily.

## 2020-10-12 ENCOUNTER — Other Ambulatory Visit (HOSPITAL_COMMUNITY): Payer: Self-pay | Admitting: Cardiology

## 2020-10-12 DIAGNOSIS — I739 Peripheral vascular disease, unspecified: Secondary | ICD-10-CM

## 2020-10-12 MED ORDER — DILTIAZEM HCL ER COATED BEADS 120 MG PO CP24: 120.0000 mg | ORAL_CAPSULE | Freq: Every day | ORAL | 3 refills | Status: DC

## 2020-10-12 NOTE — Telephone Encounter (Signed)
OK to try 120 mg Cardizem daily.

## 2020-10-12 NOTE — Telephone Encounter (Signed)
Spoke with pt, aware of dr hochrein's recommendations. He is concerned about the swelling associated with the dose of 240 mg, he wants to know if the dose can be lower. Aware will forward to dr hochrein.

## 2020-10-12 NOTE — Telephone Encounter (Signed)
Spoke with pt, aware of dr hochrein's recommendation. New script sent to the pharmacy

## 2020-10-13 ENCOUNTER — Telehealth: Payer: Self-pay | Admitting: Cardiology

## 2020-10-13 NOTE — Telephone Encounter (Signed)
    Pt c/o medication issue:  1. Name of Medication: 120 mg Cardizem   2. How are you currently taking this medication (dosage and times per day)?   3. Are you having a reaction (difficulty breathing--STAT)?   4. What is your medication issue? Pt would like to speak with Hilda Blades again regarding this med, he said he has some questions to ask her

## 2020-10-13 NOTE — Telephone Encounter (Signed)
Spoke with pt, he called to report since he did not take the metoprolol yesterday he did not have any SOB. He wanted to know what time of the day to take the diltiazem. Patient aware as long as it is the same time everyday the times does not matter.

## 2020-10-17 DIAGNOSIS — B353 Tinea pedis: Secondary | ICD-10-CM | POA: Diagnosis not present

## 2020-10-17 DIAGNOSIS — B351 Tinea unguium: Secondary | ICD-10-CM | POA: Diagnosis not present

## 2020-10-17 DIAGNOSIS — M2041 Other hammer toe(s) (acquired), right foot: Secondary | ICD-10-CM | POA: Diagnosis not present

## 2020-10-17 DIAGNOSIS — I739 Peripheral vascular disease, unspecified: Secondary | ICD-10-CM | POA: Diagnosis not present

## 2020-10-21 ENCOUNTER — Other Ambulatory Visit: Payer: Self-pay

## 2020-10-21 ENCOUNTER — Ambulatory Visit (HOSPITAL_COMMUNITY)
Admission: RE | Admit: 2020-10-21 | Discharge: 2020-10-21 | Disposition: A | Discharge status: Home / Self Care | Payer: Medicare HMO | Source: Ambulatory Visit | Attending: Cardiology | Admitting: Cardiology

## 2020-10-21 DIAGNOSIS — M7989 Other specified soft tissue disorders: Secondary | ICD-10-CM | POA: Insufficient documentation

## 2020-10-21 DIAGNOSIS — I10 Essential (primary) hypertension: Secondary | ICD-10-CM | POA: Diagnosis not present

## 2020-10-21 DIAGNOSIS — I739 Peripheral vascular disease, unspecified: Secondary | ICD-10-CM

## 2020-10-21 DIAGNOSIS — E785 Hyperlipidemia, unspecified: Secondary | ICD-10-CM | POA: Diagnosis not present

## 2020-10-25 ENCOUNTER — Other Ambulatory Visit: Payer: Self-pay

## 2020-10-25 ENCOUNTER — Ambulatory Visit (INDEPENDENT_AMBULATORY_CARE_PROVIDER_SITE_OTHER): Payer: Medicare HMO | Admitting: Cardiovascular Disease

## 2020-10-25 ENCOUNTER — Encounter: Payer: Self-pay | Admitting: Cardiovascular Disease

## 2020-10-25 VITALS — BP 160/84 | HR 76 | Ht 77.0 in | Wt 263.2 lb

## 2020-10-25 DIAGNOSIS — E785 Hyperlipidemia, unspecified: Secondary | ICD-10-CM

## 2020-10-25 DIAGNOSIS — I739 Peripheral vascular disease, unspecified: Secondary | ICD-10-CM | POA: Diagnosis not present

## 2020-10-25 DIAGNOSIS — C61 Malignant neoplasm of prostate: Secondary | ICD-10-CM | POA: Diagnosis not present

## 2020-10-25 DIAGNOSIS — I480 Paroxysmal atrial fibrillation: Secondary | ICD-10-CM

## 2020-10-25 DIAGNOSIS — I10 Essential (primary) hypertension: Secondary | ICD-10-CM

## 2020-10-25 NOTE — Patient Instructions (Signed)
Medication Instructions:  Your physician recommends that you continue on your current medications as directed. Please refer to the Current Medication list given to you today.  *If you need a refill on your cardiac medications before your next appointment, please call your pharmacy*   Follow-Up: At [facility], you and your health needs are our priority.  As part of our continuing mission to provide you with exceptional heart care, we have created designated Provider Care Teams.  These Care Teams include your primary Cardiologist (physician) and Advanced Practice Providers (APPs -  Physician Assistants and Nurse Practitioners) who all work together to provide you with the care you need, when you need it.  We recommend signing up for the patient portal called "MyChart".  Sign up information is provided on this After Visit Summary.  MyChart is used to connect with patients for Virtual Visits (Telemedicine).  Patients are able to view lab/test results, encounter notes, upcoming appointments, etc.  Non-urgent messages can be sent to your provider as well.   To learn more about what you can do with MyChart, go to [email or web address].    Your next appointment:   12 month(s)  The format for your next appointment:   In Person  Provider:   Kathlyn Sacramento, MD   Other Instructions   Heart-Healthy Eating Plan Heart-healthy meal planning includes: Eating less unhealthy fats. Eating more healthy fats. Making other changes in your diet. What are tips for following this plan? Cooking Avoid frying your food. Try to bake, boil, grill, or broil it instead. You can also reduce fat by: Removing the skin from poultry. Removing all visible fats from meats. Steaming vegetables in water or broth. Meal planning  At meals, divide your plate into four equal parts: Fill one-half of your plate with vegetables and green salads. Fill one-fourth of your plate with whole grains. Fill one-fourth of your  plate with lean protein foods. Eat 4-5 servings of vegetables per day. A serving of vegetables is: 1 cup of raw or cooked vegetables. 2 cups of raw leafy greens. Eat 4-5 servings of fruit per day. A serving of fruit is: 1 medium whole fruit.  cup of dried fruit.  cup of fresh, frozen, or canned fruit.  cup of 100% fruit juice. Eat more foods that have soluble fiber. These are apples, broccoli, carrots, beans, peas, and barley. Try to get 20-30 g of fiber per day. Eat 4-5 servings of nuts, legumes, and seeds per week: 1 serving of dried beans or legumes equals  cup after being cooked. 1 serving of nuts is  cup. 1 serving of seeds equals 1 tablespoon.  General information Eat more home-cooked food. Eat less restaurant, buffet, and fast food. Limit or avoid alcohol. Limit foods that are high in starch and sugar. Avoid fried foods. Lose weight if you are overweight. Keep track of how much salt (sodium) you eat. This is important if you have high blood pressure. Ask your doctor to tell you more about this. Try to add vegetarian meals each week. Fats Choose healthy fats. These include olive oil and canola oil, flaxseeds, walnuts, almonds, and seeds. Eat more omega-3 fats. These include salmon, mackerel, sardines, tuna, flaxseed oil, and ground flaxseeds. Try to eat fish at least 2 times each week. Check food labels. Avoid foods with trans fats or high amounts of saturated fat. Limit saturated fats. These are often found in animal products, such as meats, butter, and cream. These are also found in plant foods, such as  palm oil, palm kernel oil, and coconut oil. Avoid foods with partially hydrogenated oils in them. These have trans fats. Examples are stick margarine, some tub margarines, cookies, crackers, and other baked goods. What foods can I eat? Fruits All fresh, canned (in natural juice), or frozen fruits. Vegetables Fresh or frozen vegetables (raw, steamed, roasted, or  grilled). Green salads. Grains Most grains. Choose whole wheat and whole grains most of the time. Rice andpasta, including brown rice and pastas made with whole wheat. Meats and other proteins Lean, well-trimmed beef, veal, pork, and lamb. Chicken and [place] without skin. All fish and shellfish. Wild duck, rabbit, pheasant, and venison. Egg whites or low-cholesterol egg substitutes. Dried beans, peas, lentils, and tofu. Seedsand most nuts. Dairy Low-fat or nonfat cheeses, including ricotta and mozzarella. Skim or 1% milk that is liquid, powdered, or evaporated. Buttermilk that is made with low-fatmilk. Nonfat or low-fat yogurt. Fats and oils Non-hydrogenated (trans-free) margarines. Vegetable oils, including soybean, sesame, sunflower, olive, peanut, safflower, corn, canola, and cottonseed. Salad dressings or mayonnaisemade with a vegetable oil. Beverages Mineral water. Coffee and tea. Diet carbonated beverages. Sweets and desserts Sherbet, gelatin, and fruit ice. Small amounts of dark chocolate. Limit all sweets and desserts. Seasonings and condiments All seasonings and condiments. The items listed above may not be a complete list of foods and drinks you can eat. Contact a dietitian for more options. What foods should I avoid? Fruits Canned fruit in heavy syrup. Fruit in cream or butter sauce. Fried fruit. Limitcoconut. Vegetables Vegetables cooked in cheese, cream, or butter sauce. Fried vegetables. Grains Breads that are made with saturated or trans fats, oils, or whole milk. Croissants. Sweet rolls. Donuts. High-fat crackers,such as cheese crackers. Meats and other proteins Fatty meats, such as hot dogs, ribs, sausage, bacon, rib-eye roast or steak. High-fat deli meats, such as salami and bologna. Caviar. Domestic duck andgoose. Organ meats, such as liver. Dairy Cream, sour cream, cream cheese, and creamed cottage cheese. Whole-milk cheeses. Whole or 2% milk that is liquid, evaporated,  or condensed. Whole buttermilk. Cream sauce or high-fat cheese sauce. Yogurt that is made fromwhole milk. Fats and oils Meat fat, or shortening. Cocoa butter, hydrogenated oils, palm oil, coconut oil, palm kernel oil. Solid fats and shortenings, including bacon fat, salt pork, lard, and butter. Nondairy cream substitutes. Salad dressings with cheeseor sour cream. Beverages Regular sodas and juice drinks with added sugar. Sweets and desserts Frosting. Pudding. Cookies. Cakes. Pies. Milk chocolate or white chocolate.Buttered syrups. Full-fat ice cream or ice cream drinks. The items listed above may not be a complete list of foods and drinks to avoid. Contact a dietitian for more information. Summary Heart-healthy meal planning includes eating less unhealthy fats, eating more healthy fats, and making other changes in your diet. Eat a balanced diet. This includes fruits and vegetables, low-fat or nonfat dairy, lean protein, nuts and legumes, whole grains, and heart-healthy oils and fats. This information is not intended to replace advice given to you by your health care provider. Make sure you discuss any questions you have with your healthcare provider. Document Revised: 07/04/2017 Document Reviewed: 06/07/2017 Elsevier Patient Education  2022 [employer].

## 2020-10-25 NOTE — Progress Notes (Signed)
Cardiology Office Note   Date:  10/25/2020   ID:  Kevin Merritt, DOB October 25, 1950, MRN [id number]  PCP:  [name], MD  Cardiologist:  Dr. Percival Spanish  No chief complaint on file.     History of Present Illness: Kevin Merritt is a 70 y.o. male who was referred by Dr. Percival Spanish for evaluation management of peripheral arterial disease. He has known history of paroxysmal atrial fibrillation, orthostatic hypotension, thoracic aortic aneurysm of 4.3 cm, chronic kidney disease, essential hypertension, hyperlipidemia and prostate cancer. He recently had significant leg swelling and thus was switched from diltiazem to Toprol.  However, he did not tolerate Toprol due to increased shortness of breath and was placed back on diltiazem at a reduced dose.  The patient complains of bilateral discomfort at the ankles slightly worse on the right side than the left side.  No significant calf or thigh discomfort.  Symptoms are random and can happen at rest or with exertion.  He has no lower extremity ulceration.  He had recent noninvasive vascular studies which showed an ABI of 0.78 on the right and 0.91 on the left.  Duplex showed no significant infrainguinal disease with the exception of distal right ATA occlusion.  Past Medical History:  Diagnosis Date   CKD (chronic kidney disease), stage III ([facility])    High cholesterol    Hypertension    Prostate cancer [facility])     Past Surgical History:  Procedure Laterality Date   COLONOSCOPY W/ POLYPECTOMY  2012   HERNIA REPAIR Right    Age 12   NM MYOVIEW LTD  10/15/2013   7 min, 8.5 METs, 57%, ASx ST depressions - No ischemia / infarction.   THYROIDECTOMY     Age 39   TONSILLECTOMY     Age 38   TRANSTHORACIC ECHOCARDIOGRAM  10/28/2013   Nl LV Size & function; EF 65-70%, mod Conc LVH with Gd 2 DD     Current Outpatient Medications  Medication Sig Dispense Refill   albuterol (VENTOLIN HFA) 108 (90 Base) MCG/ACT inhaler      aspirin EC 81 MG  tablet Take 81 mg by mouth daily. Swallow whole.     atorvastatin (LIPITOR) 20 MG tablet Take 20 mg by mouth daily.     diltiazem (CARDIZEM CD) 120 MG 24 hr capsule Take 1 capsule (120 mg total) by mouth daily. 90 capsule 3   furosemide (LASIX) 40 MG tablet      leuprolide, 6 Month, (ELIGARD) 45 MG injection Inject 45 mg into the skin every 6 (six) months.     tamsulosin (FLOMAX) 0.4 MG CAPS capsule Take 1 capsule (0.4 mg total) by mouth daily after supper. 30 capsule 0   No current facility-administered medications for this visit.    Allergies:   Shellfish allergy    Social History:  The patient  reports that he has never smoked. He has never used smokeless tobacco. He reports that he does not drink alcohol and does not use drugs.   Family History:  The patient's family history includes Diabetes (age of onset: 72) in his sister; Heart failure (age of onset: 36) in his mother; Hypertension in his mother and sister.    ROS:  Please see the history of present illness.   Otherwise, review of systems are positive for none.   All other systems are reviewed and negative.    PHYSICAL EXAM: VS:  BP (!) 160/84 (BP Location: Left Arm, Patient Position: Sitting, Cuff Size: Normal)  Pulse 76   Ht 6\' 5"  (1.956 m)   Wt 263 lb 3.2 oz (119.4 kg)   BMI 31.21 kg/m  , BMI Body mass index is 31.21 kg/m. GEN: Well nourished, well developed, in no acute distress  HEENT: normal  Neck: no JVD, carotid bruits, or masses Cardiac: RRR; no  rubs, or gallops, 1 out of 6 systolic murmur in the aortic area.  Mild bilateral leg edema Respiratory:  clear to auscultation bilaterally, normal work of breathing GI: soft, nontender, nondistended, + BS MS: no deformity or atrophy  Skin: warm and dry, no rash Neuro:  Strength and sensation are intact Psych: euthymic mood, full affect Vascular: Femoral pulses normal bilaterally.  Posterior tibial is palpable on both sides.   EKG:  EKG is not ordered  today.    Recent Labs: 02/03/2020: B Natriuretic Peptide 147.2; TSH 1.459 02/07/2020: ALT 57 03/18/2020: Magnesium 2.1 04/15/2020: Hemoglobin 10.8; Platelets 303 10/06/2020: BUN 13; Creatinine, Ser 0.95; Potassium 4.4; Sodium 141    Lipid Panel No results found for: CHOL, TRIG, HDL, CHOLHDL, VLDL, LDLCALC, LDLDIRECT    Wt Readings from Last 3 Encounters:  10/25/20 263 lb 3.2 oz (119.4 kg)  10/06/20 267 lb 3.2 oz (121.2 kg)  05/20/20 256 lb (116.1 kg)      No flowsheet data found.    ASSESSMENT AND PLAN:  1.  Peripheral arterial disease: The patient does have evidence of peripheral arterial disease based on noninvasive vascular studies.  However, I do not think his disease is critical.  Current symptoms of bilateral ankle discomfort seems atypical overall and unlikely to be true claudication.  He does have palpable pulses distally.  At this stage of the disease, I do not recommend any invasive work-up.  Recommend treatment of risk factors.  I also discussed with him the importance of healthy lifestyle changes and the importance of at least 30 minutes of walking daily.  2.  Bilateral leg edema: Suspect a component of chronic venous insufficiency and also treatment with diltiazem is playing a role.  I talked with him about importance of intermittent leg elevation during the day.  3.  Paroxysmal atrial fibrillation: He seems to be in sinus rhythm.  4.  Essential hypertension: Blood pressure is mildly elevated today.  Continue to monitor and adjust meds as needed.  5.  Hyperlipidemia: Continue treatment with atorvastatin with a target LDL of less than 70 given the presence of peripheral arterial disease.  6.  Thoracic aortic aneurysm: Agree with periodic monitoring.    Disposition:   FU with me in 1 year  Signed,  Kathlyn Sacramento, MD  10/25/2020 8:59 AM    [place]

## 2020-10-26 DIAGNOSIS — Z20822 Contact with and (suspected) exposure to covid-19: Secondary | ICD-10-CM | POA: Diagnosis not present

## 2020-11-01 DIAGNOSIS — E782 Mixed hyperlipidemia: Secondary | ICD-10-CM | POA: Diagnosis not present

## 2020-11-01 DIAGNOSIS — I4891 Unspecified atrial fibrillation: Secondary | ICD-10-CM | POA: Diagnosis not present

## 2020-11-01 DIAGNOSIS — R351 Nocturia: Secondary | ICD-10-CM | POA: Diagnosis not present

## 2020-11-01 DIAGNOSIS — R3912 Poor urinary stream: Secondary | ICD-10-CM | POA: Diagnosis not present

## 2020-11-01 DIAGNOSIS — R350 Frequency of micturition: Secondary | ICD-10-CM | POA: Diagnosis not present

## 2020-11-01 DIAGNOSIS — C61 Malignant neoplasm of prostate: Secondary | ICD-10-CM | POA: Diagnosis not present

## 2020-11-01 DIAGNOSIS — I480 Paroxysmal atrial fibrillation: Secondary | ICD-10-CM | POA: Diagnosis not present

## 2020-11-01 DIAGNOSIS — I10 Essential (primary) hypertension: Secondary | ICD-10-CM | POA: Diagnosis not present

## 2020-11-01 DIAGNOSIS — N182 Chronic kidney disease, stage 2 (mild): Secondary | ICD-10-CM | POA: Diagnosis not present

## 2020-11-01 DIAGNOSIS — E7800 Pure hypercholesterolemia, unspecified: Secondary | ICD-10-CM | POA: Diagnosis not present

## 2020-12-20 DIAGNOSIS — M2041 Other hammer toe(s) (acquired), right foot: Secondary | ICD-10-CM | POA: Diagnosis not present

## 2020-12-20 DIAGNOSIS — B351 Tinea unguium: Secondary | ICD-10-CM | POA: Diagnosis not present

## 2020-12-20 DIAGNOSIS — B353 Tinea pedis: Secondary | ICD-10-CM | POA: Diagnosis not present

## 2020-12-23 DIAGNOSIS — Z20822 Contact with and (suspected) exposure to covid-19: Secondary | ICD-10-CM | POA: Diagnosis not present

## 2021-01-06 ENCOUNTER — Ambulatory Visit: Payer: Medicare HMO | Admitting: Cardiology

## 2021-01-09 ENCOUNTER — Ambulatory Visit: Payer: Medicare HMO | Admitting: Physician Assistant

## 2021-01-26 DIAGNOSIS — C61 Malignant neoplasm of prostate: Secondary | ICD-10-CM | POA: Diagnosis not present

## 2021-02-02 DIAGNOSIS — C61 Malignant neoplasm of prostate: Secondary | ICD-10-CM | POA: Diagnosis not present

## 2021-02-07 DIAGNOSIS — I10 Essential (primary) hypertension: Secondary | ICD-10-CM | POA: Diagnosis not present

## 2021-02-07 DIAGNOSIS — E7800 Pure hypercholesterolemia, unspecified: Secondary | ICD-10-CM | POA: Diagnosis not present

## 2021-02-07 DIAGNOSIS — I480 Paroxysmal atrial fibrillation: Secondary | ICD-10-CM | POA: Diagnosis not present

## 2021-02-07 DIAGNOSIS — E782 Mixed hyperlipidemia: Secondary | ICD-10-CM | POA: Diagnosis not present

## 2021-02-07 DIAGNOSIS — I4891 Unspecified atrial fibrillation: Secondary | ICD-10-CM | POA: Diagnosis not present

## 2021-02-07 DIAGNOSIS — N182 Chronic kidney disease, stage 2 (mild): Secondary | ICD-10-CM | POA: Diagnosis not present

## 2021-02-09 DIAGNOSIS — Z23 Encounter for immunization: Secondary | ICD-10-CM | POA: Diagnosis not present

## 2021-02-09 DIAGNOSIS — I5189 Other ill-defined heart diseases: Secondary | ICD-10-CM | POA: Diagnosis not present

## 2021-02-09 DIAGNOSIS — Z0000 Encounter for general adult medical examination without abnormal findings: Secondary | ICD-10-CM | POA: Diagnosis not present

## 2021-02-09 DIAGNOSIS — C61 Malignant neoplasm of prostate: Secondary | ICD-10-CM | POA: Diagnosis not present

## 2021-02-09 DIAGNOSIS — I712 Thoracic aortic aneurysm, without rupture: Secondary | ICD-10-CM | POA: Diagnosis not present

## 2021-02-09 DIAGNOSIS — I4891 Unspecified atrial fibrillation: Secondary | ICD-10-CM | POA: Diagnosis not present

## 2021-02-09 DIAGNOSIS — E7800 Pure hypercholesterolemia, unspecified: Secondary | ICD-10-CM | POA: Diagnosis not present

## 2021-02-09 DIAGNOSIS — I10 Essential (primary) hypertension: Secondary | ICD-10-CM | POA: Diagnosis not present

## 2021-02-16 ENCOUNTER — Other Ambulatory Visit: Payer: Self-pay

## 2021-02-16 NOTE — Telephone Encounter (Signed)
This is Dr. Hochrein's pt. °

## 2021-02-20 DIAGNOSIS — I480 Paroxysmal atrial fibrillation: Secondary | ICD-10-CM | POA: Diagnosis not present

## 2021-02-20 DIAGNOSIS — E782 Mixed hyperlipidemia: Secondary | ICD-10-CM | POA: Diagnosis not present

## 2021-02-20 DIAGNOSIS — I10 Essential (primary) hypertension: Secondary | ICD-10-CM | POA: Diagnosis not present

## 2021-02-20 DIAGNOSIS — E7800 Pure hypercholesterolemia, unspecified: Secondary | ICD-10-CM | POA: Diagnosis not present

## 2021-02-20 DIAGNOSIS — I4891 Unspecified atrial fibrillation: Secondary | ICD-10-CM | POA: Diagnosis not present

## 2021-02-20 DIAGNOSIS — N182 Chronic kidney disease, stage 2 (mild): Secondary | ICD-10-CM | POA: Diagnosis not present

## 2021-02-23 MED ORDER — DILTIAZEM HCL ER COATED BEADS 120 MG PO CP24: 120.0000 mg | ORAL_CAPSULE | Freq: Every day | ORAL | 3 refills | Status: DC

## 2021-02-24 DIAGNOSIS — D649 Anemia, unspecified: Secondary | ICD-10-CM | POA: Diagnosis not present

## 2021-03-13 DIAGNOSIS — B351 Tinea unguium: Secondary | ICD-10-CM | POA: Diagnosis not present

## 2021-03-13 DIAGNOSIS — B353 Tinea pedis: Secondary | ICD-10-CM | POA: Diagnosis not present

## 2021-03-13 DIAGNOSIS — M2041 Other hammer toe(s) (acquired), right foot: Secondary | ICD-10-CM | POA: Diagnosis not present

## 2021-03-16 IMAGING — CT CT HEAD W/O CM
4 series · 16 of 47 positions shown, 18 images · non-contrast
Comparison: None.

CLINICAL DATA: Syncope with minor head trauma

EXAM:
CT HEAD WITHOUT CONTRAST
TECHNIQUE: Contiguous axial images were obtained from the base of the skull
through the vertex without intravenous contrast.

[Series 3: head wo · axial · 0.41mm/px · z∈[-71,+49]mm · 7 of 32 slices shown, 9 images]
[im 4/32  brain]
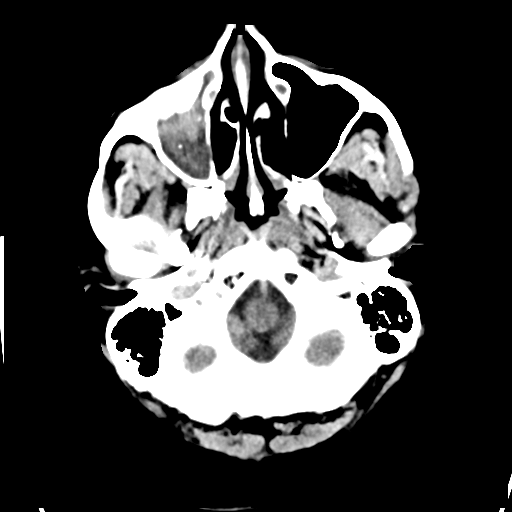
[im 4/32  bone]
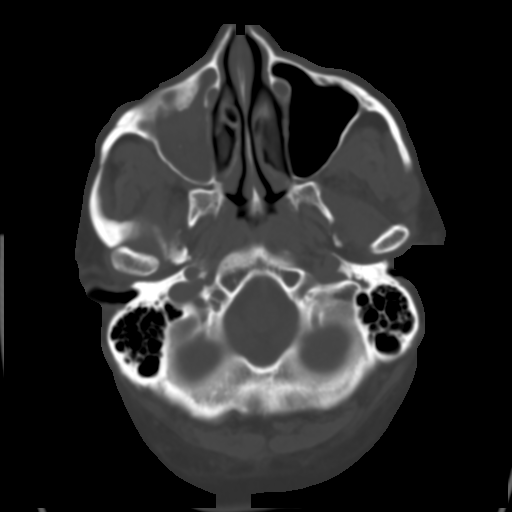
[im 8/32  brain]
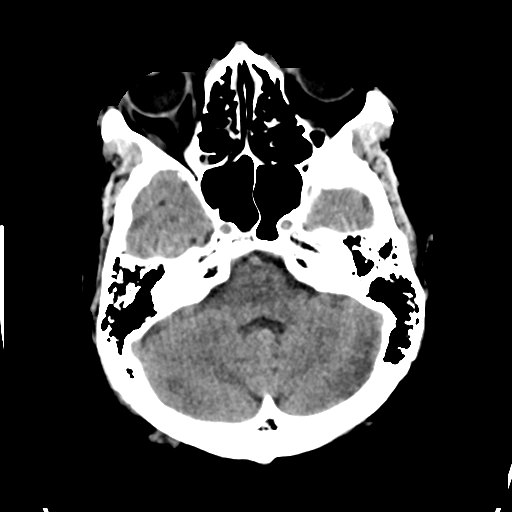
[im 12/32  brain]
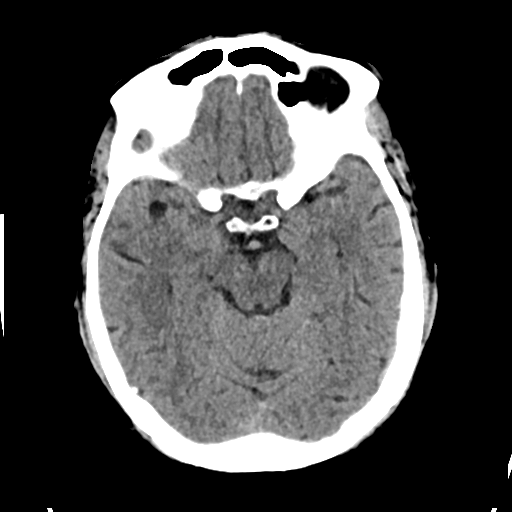
[im 16/32  brain]
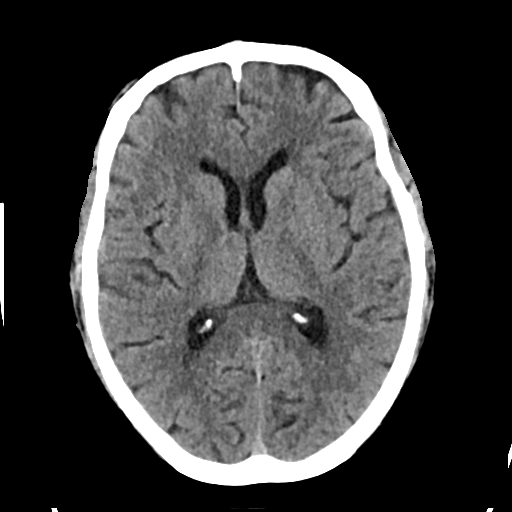
[im 20/32  brain]
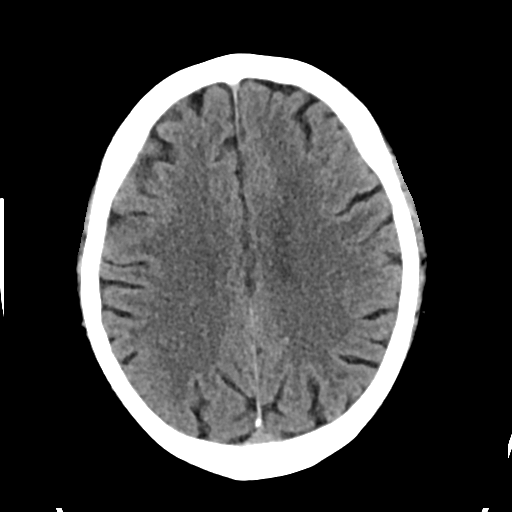
[im 20/32  bone]
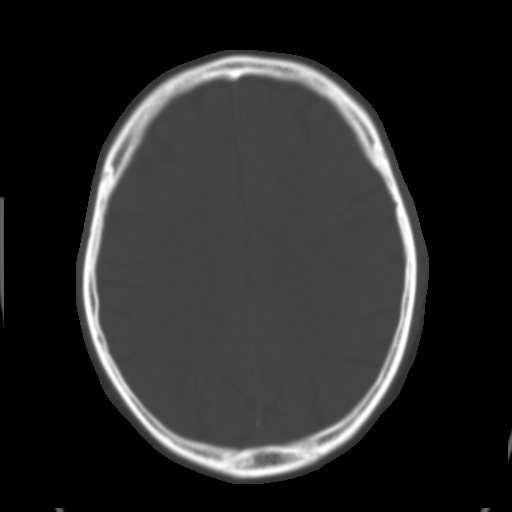
[im 24/32  brain]
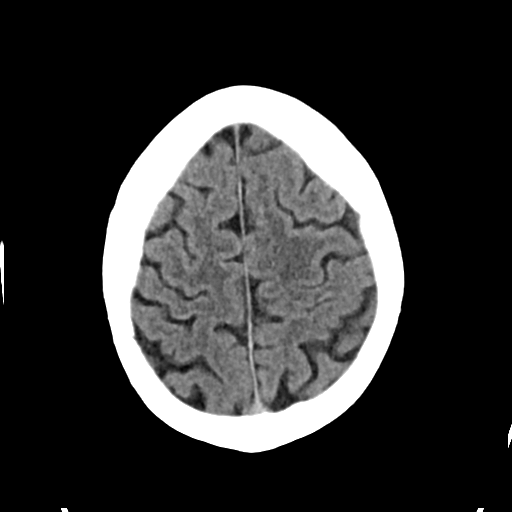
[im 28/32  brain]
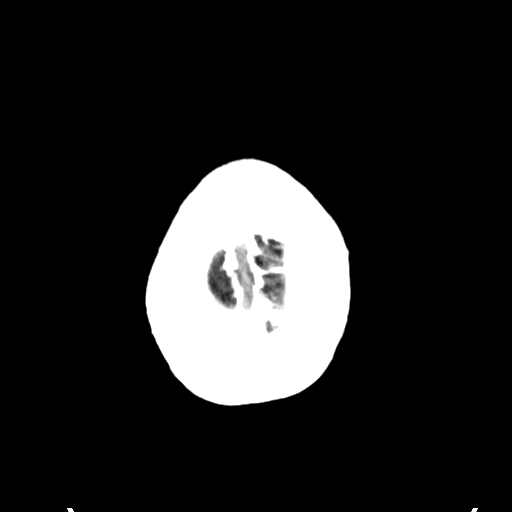

[Series 4: head bone · axial · 0.41mm/px · z∈[-72,-40]mm · 3 of 80 slices shown]
[im 8/80  bone]
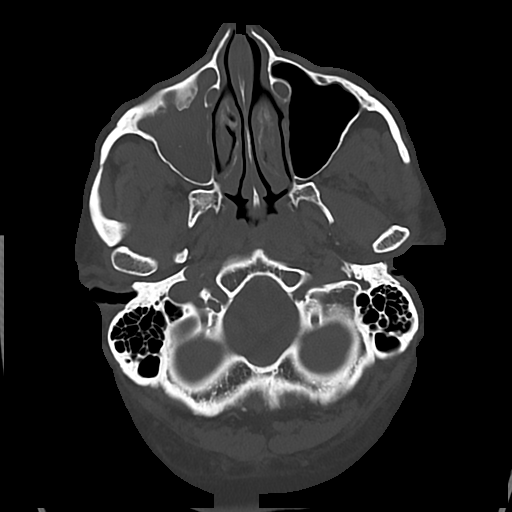
[im 16/80  bone]
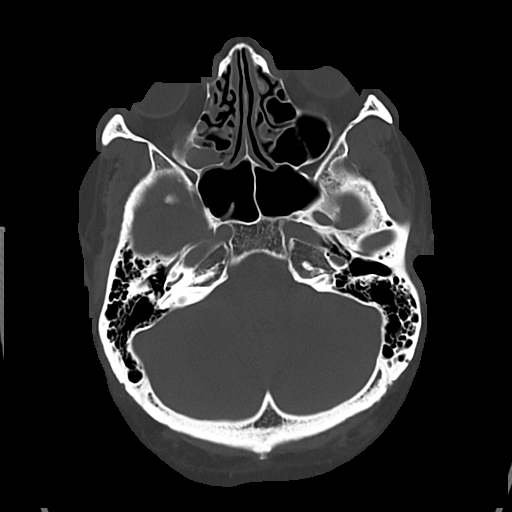
[im 24/80  bone]
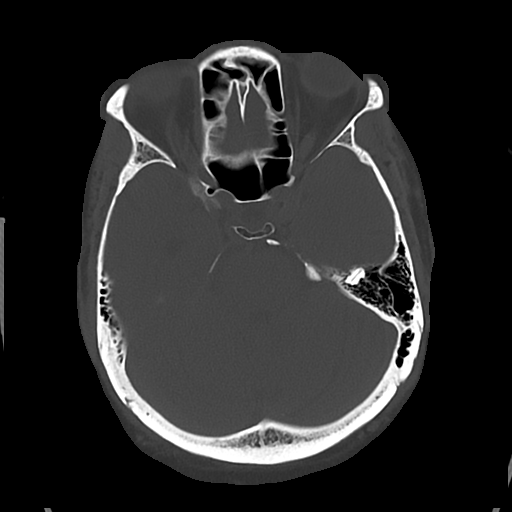

[Series 5: cor soft · coronal · 0.31mm/px · 3 of 72 slices shown]
[im 24/72  brain]
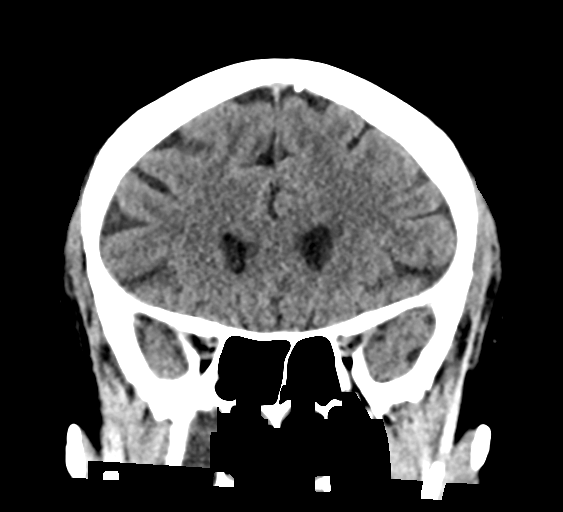
[im 32/72  brain]
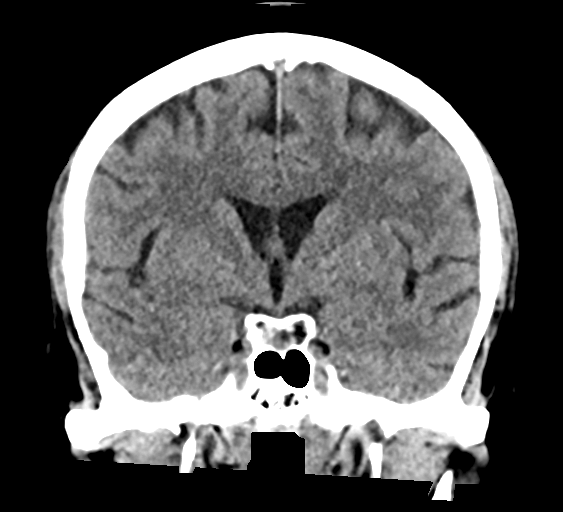
[im 40/72  brain]
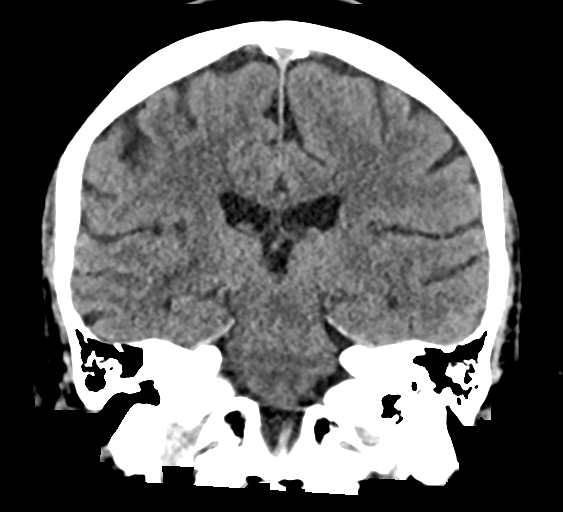

[Series 6: sag soft · sagittal · 0.30mm/px · 3 of 57 slices shown]
[im 19/57  brain]
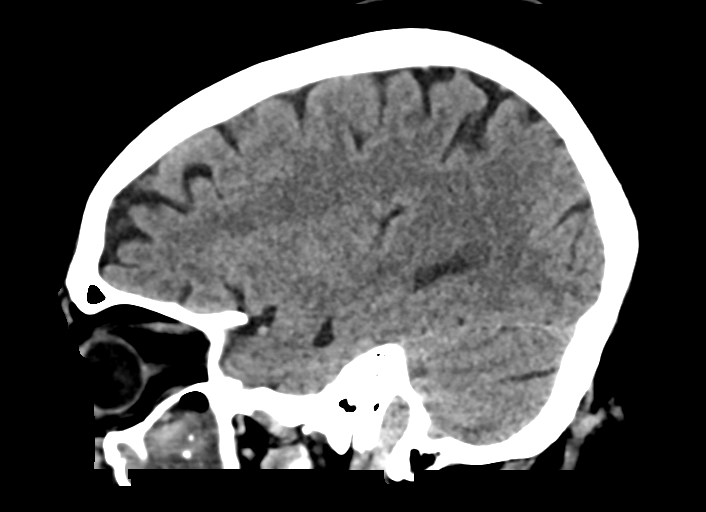
[im 29/57  brain]
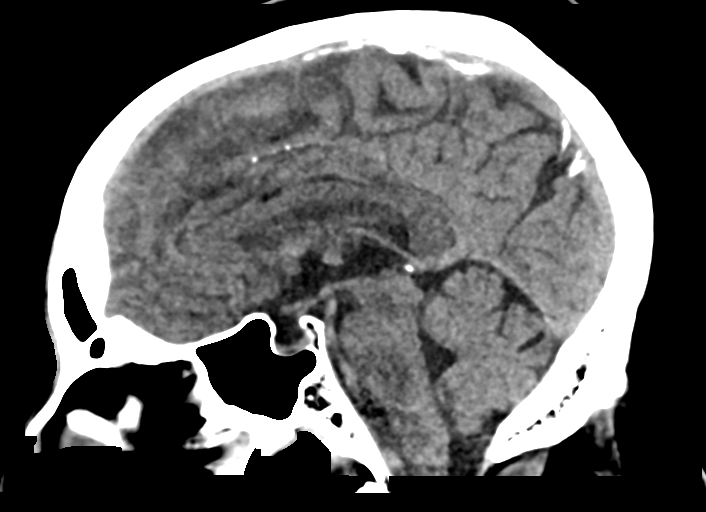
[im 38/57  brain]
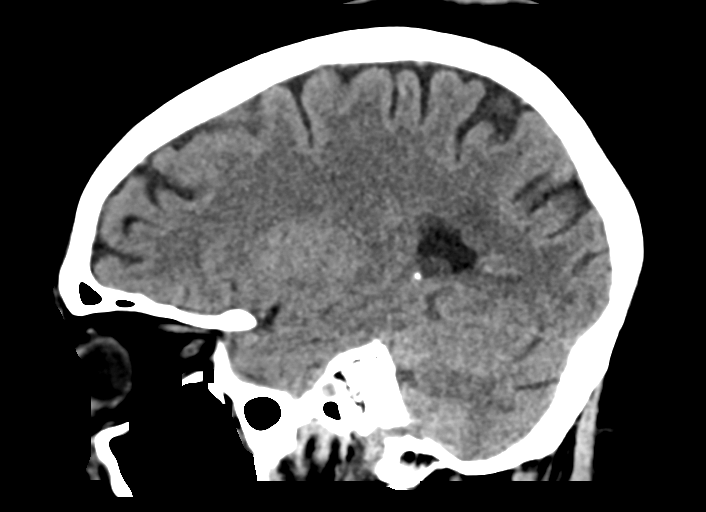

[16 of 47 positions shown; findings below may reference images not displayed]

FINDINGS: Brain: No evidence of acute infarction, hemorrhage, hydrocephalus,
extra-axial collection or mass lesion/mass effect.

Vascular: No hyperdense vessel or unexpected calcification.

Skull: Normal. Negative for fracture or focal lesion.

Sinuses/Orbits: Partially covered right maxillary sinus which is
essentially completely opacified where seen. There is internal high
and low-density components likely from mucosal edema and trapped
secretions.
IMPRESSION: 1. No evidence of intracranial injury.
2. Essentially complete opacification of the right maxillary sinus
that is new from a 1636 neck CT

## 2021-03-19 IMAGING — DX DG CHEST 1V PORT
2 series · 2 of 2 positions shown · non-contrast
Comparison: Chest radiograph 09/16/2013.

CLINICAL DATA: History of C3O2C-D9.

EXAM:
PORTABLE CHEST 1 VIEW

[chest ap (1 of 2)]
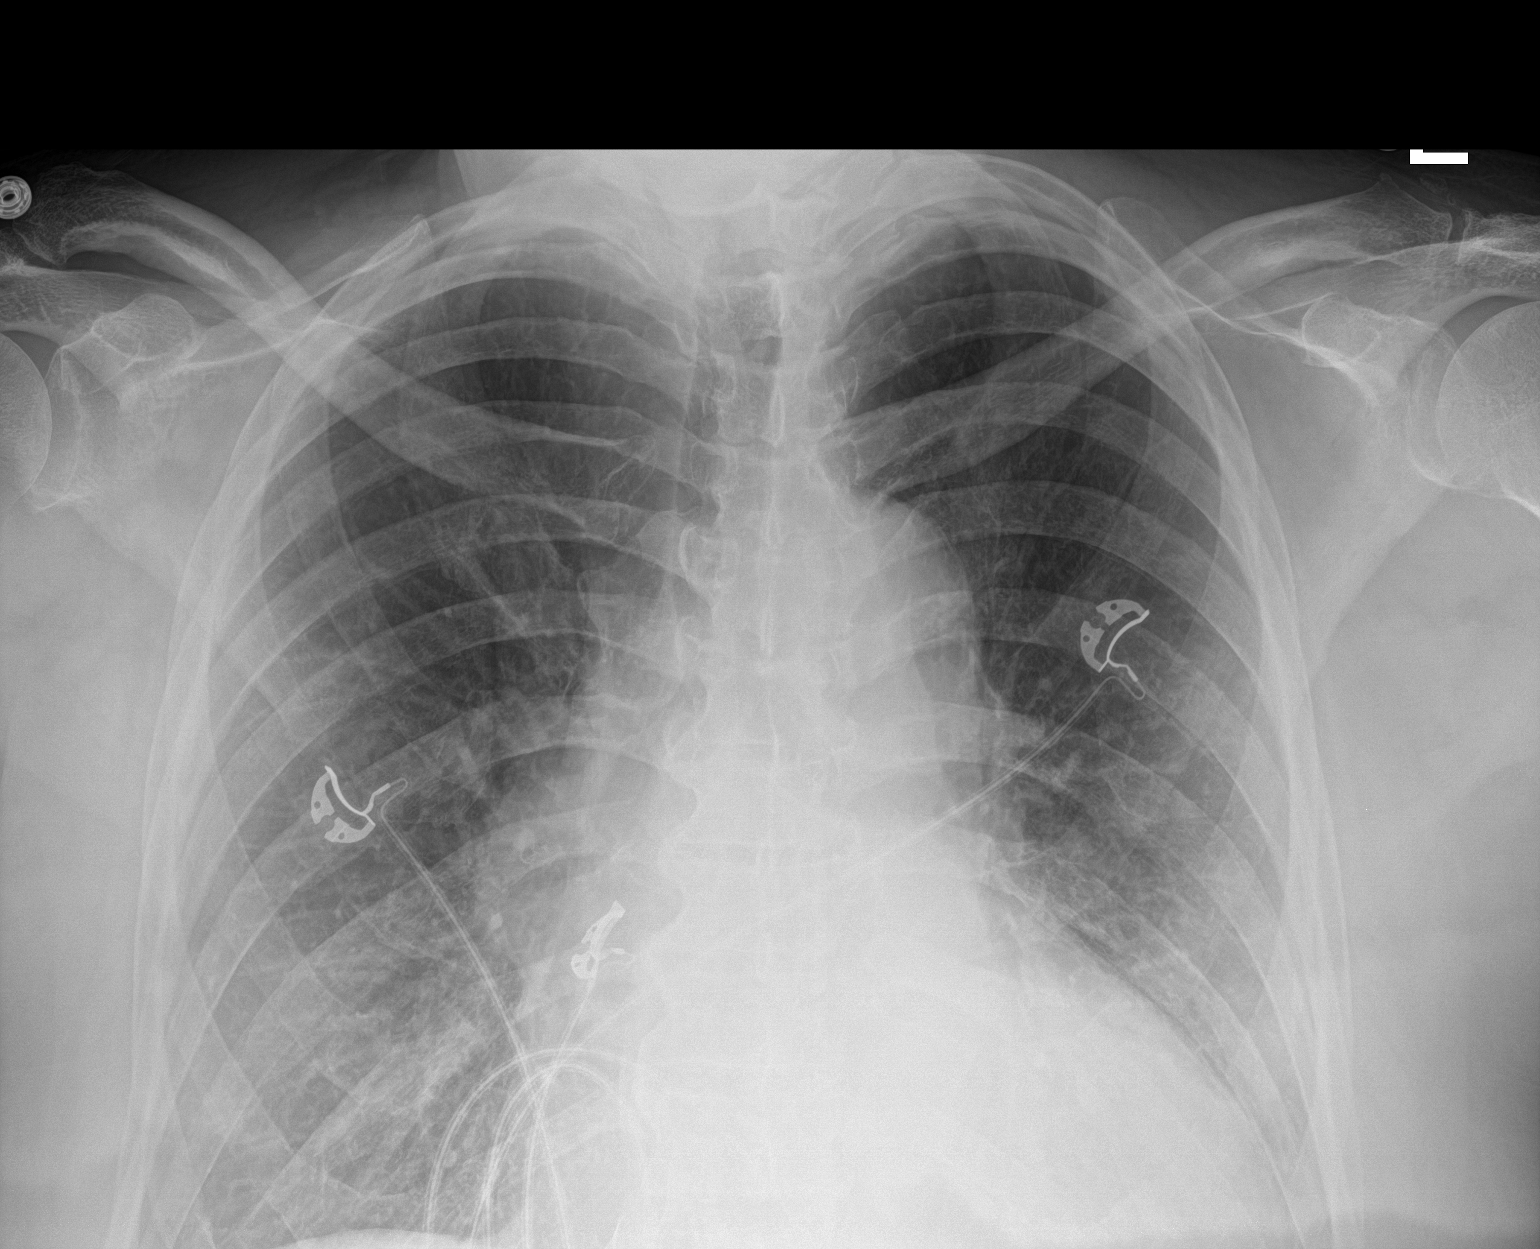

[chest ap (2 of 2)]
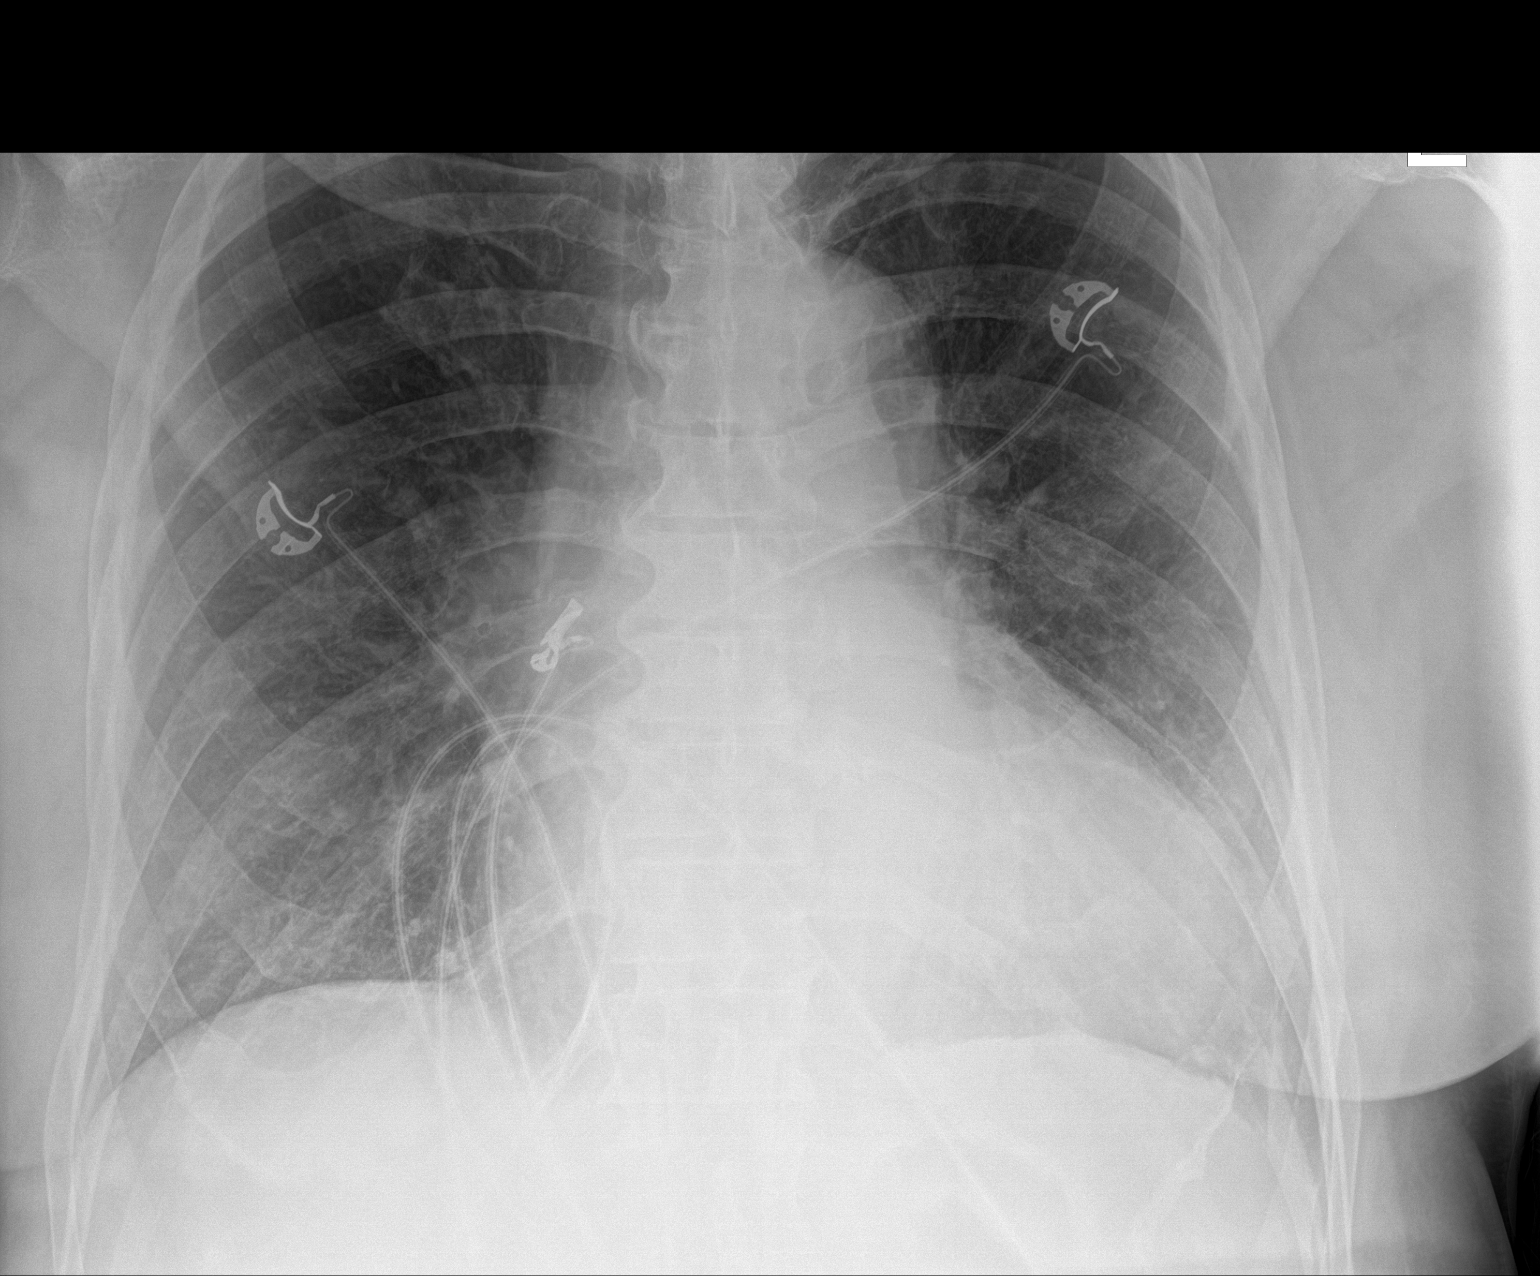

[2 of 2 positions shown; findings below may reference images not displayed]

FINDINGS: Monitoring leads overlie the patient. Cardiac contours upper limits
of normal. Mild bilateral peripheral airspace opacities. No pleural
effusion or pneumothorax. Thoracic spine degenerative changes.
IMPRESSION: Mild bilateral peripheral airspace opacities may represent atypical
infectious process.

## 2021-04-03 DIAGNOSIS — I4891 Unspecified atrial fibrillation: Secondary | ICD-10-CM | POA: Diagnosis not present

## 2021-04-03 DIAGNOSIS — I480 Paroxysmal atrial fibrillation: Secondary | ICD-10-CM | POA: Diagnosis not present

## 2021-04-03 DIAGNOSIS — E782 Mixed hyperlipidemia: Secondary | ICD-10-CM | POA: Diagnosis not present

## 2021-04-03 DIAGNOSIS — N182 Chronic kidney disease, stage 2 (mild): Secondary | ICD-10-CM | POA: Diagnosis not present

## 2021-04-03 DIAGNOSIS — I10 Essential (primary) hypertension: Secondary | ICD-10-CM | POA: Diagnosis not present

## 2021-04-03 DIAGNOSIS — E7800 Pure hypercholesterolemia, unspecified: Secondary | ICD-10-CM | POA: Diagnosis not present

## 2021-04-18 DIAGNOSIS — I10 Essential (primary) hypertension: Secondary | ICD-10-CM | POA: Diagnosis not present

## 2021-05-03 DIAGNOSIS — I480 Paroxysmal atrial fibrillation: Secondary | ICD-10-CM | POA: Diagnosis not present

## 2021-05-03 DIAGNOSIS — E782 Mixed hyperlipidemia: Secondary | ICD-10-CM | POA: Diagnosis not present

## 2021-05-03 DIAGNOSIS — E7800 Pure hypercholesterolemia, unspecified: Secondary | ICD-10-CM | POA: Diagnosis not present

## 2021-05-03 DIAGNOSIS — I4891 Unspecified atrial fibrillation: Secondary | ICD-10-CM | POA: Diagnosis not present

## 2021-05-03 DIAGNOSIS — I10 Essential (primary) hypertension: Secondary | ICD-10-CM | POA: Diagnosis not present

## 2021-05-31 DIAGNOSIS — B351 Tinea unguium: Secondary | ICD-10-CM | POA: Diagnosis not present

## 2021-05-31 DIAGNOSIS — M2041 Other hammer toe(s) (acquired), right foot: Secondary | ICD-10-CM | POA: Diagnosis not present

## 2021-05-31 DIAGNOSIS — B353 Tinea pedis: Secondary | ICD-10-CM | POA: Diagnosis not present

## 2021-06-12 DIAGNOSIS — N182 Chronic kidney disease, stage 2 (mild): Secondary | ICD-10-CM | POA: Diagnosis not present

## 2021-06-12 DIAGNOSIS — I480 Paroxysmal atrial fibrillation: Secondary | ICD-10-CM | POA: Diagnosis not present

## 2021-06-12 DIAGNOSIS — I10 Essential (primary) hypertension: Secondary | ICD-10-CM | POA: Diagnosis not present

## 2021-06-12 DIAGNOSIS — E782 Mixed hyperlipidemia: Secondary | ICD-10-CM | POA: Diagnosis not present

## 2021-06-12 DIAGNOSIS — E7800 Pure hypercholesterolemia, unspecified: Secondary | ICD-10-CM | POA: Diagnosis not present

## 2021-06-23 DIAGNOSIS — I10 Essential (primary) hypertension: Secondary | ICD-10-CM | POA: Diagnosis not present

## 2021-06-26 DIAGNOSIS — E782 Mixed hyperlipidemia: Secondary | ICD-10-CM | POA: Diagnosis not present

## 2021-06-26 DIAGNOSIS — I480 Paroxysmal atrial fibrillation: Secondary | ICD-10-CM | POA: Diagnosis not present

## 2021-06-26 DIAGNOSIS — I10 Essential (primary) hypertension: Secondary | ICD-10-CM | POA: Diagnosis not present

## 2021-06-29 DIAGNOSIS — R413 Other amnesia: Secondary | ICD-10-CM | POA: Diagnosis not present

## 2021-07-07 DIAGNOSIS — Z1211 Encounter for screening for malignant neoplasm of colon: Secondary | ICD-10-CM | POA: Diagnosis not present

## 2021-07-07 DIAGNOSIS — D123 Benign neoplasm of transverse colon: Secondary | ICD-10-CM | POA: Diagnosis not present

## 2021-07-11 DIAGNOSIS — D123 Benign neoplasm of transverse colon: Secondary | ICD-10-CM | POA: Diagnosis not present

## 2021-07-13 DIAGNOSIS — R413 Other amnesia: Secondary | ICD-10-CM | POA: Diagnosis not present

## 2021-07-13 DIAGNOSIS — E7800 Pure hypercholesterolemia, unspecified: Secondary | ICD-10-CM | POA: Diagnosis not present

## 2021-07-27 DIAGNOSIS — I480 Paroxysmal atrial fibrillation: Secondary | ICD-10-CM | POA: Diagnosis not present

## 2021-07-27 DIAGNOSIS — N182 Chronic kidney disease, stage 2 (mild): Secondary | ICD-10-CM | POA: Diagnosis not present

## 2021-07-27 DIAGNOSIS — C61 Malignant neoplasm of prostate: Secondary | ICD-10-CM | POA: Diagnosis not present

## 2021-07-27 DIAGNOSIS — E782 Mixed hyperlipidemia: Secondary | ICD-10-CM | POA: Diagnosis not present

## 2021-07-27 DIAGNOSIS — I10 Essential (primary) hypertension: Secondary | ICD-10-CM | POA: Diagnosis not present

## 2021-07-27 DIAGNOSIS — I4891 Unspecified atrial fibrillation: Secondary | ICD-10-CM | POA: Diagnosis not present

## 2021-08-03 DIAGNOSIS — C61 Malignant neoplasm of prostate: Secondary | ICD-10-CM | POA: Diagnosis not present

## 2021-08-03 DIAGNOSIS — R351 Nocturia: Secondary | ICD-10-CM | POA: Diagnosis not present

## 2021-08-03 DIAGNOSIS — N401 Enlarged prostate with lower urinary tract symptoms: Secondary | ICD-10-CM | POA: Diagnosis not present

## 2021-09-01 DIAGNOSIS — M2041 Other hammer toe(s) (acquired), right foot: Secondary | ICD-10-CM | POA: Diagnosis not present

## 2021-09-01 DIAGNOSIS — B353 Tinea pedis: Secondary | ICD-10-CM | POA: Diagnosis not present

## 2021-09-01 DIAGNOSIS — B351 Tinea unguium: Secondary | ICD-10-CM | POA: Diagnosis not present

## 2021-09-13 DIAGNOSIS — I10 Essential (primary) hypertension: Secondary | ICD-10-CM | POA: Diagnosis not present

## 2021-09-13 DIAGNOSIS — H25813 Combined forms of age-related cataract, bilateral: Secondary | ICD-10-CM | POA: Diagnosis not present

## 2021-09-13 DIAGNOSIS — H35033 Hypertensive retinopathy, bilateral: Secondary | ICD-10-CM | POA: Diagnosis not present

## 2021-10-02 DIAGNOSIS — H25812 Combined forms of age-related cataract, left eye: Secondary | ICD-10-CM | POA: Diagnosis not present

## 2021-10-02 DIAGNOSIS — H25811 Combined forms of age-related cataract, right eye: Secondary | ICD-10-CM | POA: Diagnosis not present

## 2021-10-02 DIAGNOSIS — H25813 Combined forms of age-related cataract, bilateral: Secondary | ICD-10-CM | POA: Diagnosis not present

## 2021-10-24 DIAGNOSIS — E782 Mixed hyperlipidemia: Secondary | ICD-10-CM | POA: Diagnosis not present

## 2021-10-24 DIAGNOSIS — I10 Essential (primary) hypertension: Secondary | ICD-10-CM | POA: Diagnosis not present

## 2021-10-24 DIAGNOSIS — N182 Chronic kidney disease, stage 2 (mild): Secondary | ICD-10-CM | POA: Diagnosis not present

## 2021-12-06 DIAGNOSIS — B353 Tinea pedis: Secondary | ICD-10-CM | POA: Diagnosis not present

## 2021-12-06 DIAGNOSIS — B351 Tinea unguium: Secondary | ICD-10-CM | POA: Diagnosis not present

## 2021-12-06 DIAGNOSIS — L84 Corns and callosities: Secondary | ICD-10-CM | POA: Diagnosis not present

## 2021-12-06 DIAGNOSIS — M2041 Other hammer toe(s) (acquired), right foot: Secondary | ICD-10-CM | POA: Diagnosis not present

## 2021-12-25 ENCOUNTER — Other Ambulatory Visit: Payer: Self-pay | Admitting: Cardiology

## 2022-01-25 DIAGNOSIS — I10 Essential (primary) hypertension: Secondary | ICD-10-CM | POA: Diagnosis not present

## 2022-01-25 DIAGNOSIS — E7800 Pure hypercholesterolemia, unspecified: Secondary | ICD-10-CM | POA: Diagnosis not present

## 2022-01-25 DIAGNOSIS — I480 Paroxysmal atrial fibrillation: Secondary | ICD-10-CM | POA: Diagnosis not present

## 2022-02-01 DIAGNOSIS — C61 Malignant neoplasm of prostate: Secondary | ICD-10-CM | POA: Diagnosis not present

## 2022-02-08 DIAGNOSIS — C61 Malignant neoplasm of prostate: Secondary | ICD-10-CM | POA: Diagnosis not present

## 2022-02-08 DIAGNOSIS — E349 Endocrine disorder, unspecified: Secondary | ICD-10-CM | POA: Diagnosis not present

## 2022-03-12 DIAGNOSIS — H25813 Combined forms of age-related cataract, bilateral: Secondary | ICD-10-CM | POA: Diagnosis not present

## 2022-03-12 DIAGNOSIS — H25812 Combined forms of age-related cataract, left eye: Secondary | ICD-10-CM | POA: Diagnosis not present

## 2022-03-12 DIAGNOSIS — H25811 Combined forms of age-related cataract, right eye: Secondary | ICD-10-CM | POA: Diagnosis not present

## 2022-03-21 DIAGNOSIS — H269 Unspecified cataract: Secondary | ICD-10-CM | POA: Diagnosis not present

## 2022-03-21 DIAGNOSIS — H25812 Combined forms of age-related cataract, left eye: Secondary | ICD-10-CM | POA: Diagnosis not present

## 2022-03-28 DIAGNOSIS — I7121 Aneurysm of the ascending aorta, without rupture: Secondary | ICD-10-CM | POA: Diagnosis not present

## 2022-03-28 DIAGNOSIS — E041 Nontoxic single thyroid nodule: Secondary | ICD-10-CM | POA: Diagnosis not present

## 2022-03-28 DIAGNOSIS — Z5181 Encounter for therapeutic drug level monitoring: Secondary | ICD-10-CM | POA: Diagnosis not present

## 2022-03-28 DIAGNOSIS — I5189 Other ill-defined heart diseases: Secondary | ICD-10-CM | POA: Diagnosis not present

## 2022-03-28 DIAGNOSIS — E7800 Pure hypercholesterolemia, unspecified: Secondary | ICD-10-CM | POA: Diagnosis not present

## 2022-03-28 DIAGNOSIS — I480 Paroxysmal atrial fibrillation: Secondary | ICD-10-CM | POA: Diagnosis not present

## 2022-03-28 DIAGNOSIS — Z0000 Encounter for general adult medical examination without abnormal findings: Secondary | ICD-10-CM | POA: Diagnosis not present

## 2022-03-28 DIAGNOSIS — C61 Malignant neoplasm of prostate: Secondary | ICD-10-CM | POA: Diagnosis not present

## 2022-03-28 DIAGNOSIS — I10 Essential (primary) hypertension: Secondary | ICD-10-CM | POA: Diagnosis not present

## 2022-03-30 DIAGNOSIS — E042 Nontoxic multinodular goiter: Secondary | ICD-10-CM | POA: Diagnosis not present

## 2022-03-30 DIAGNOSIS — E041 Nontoxic single thyroid nodule: Secondary | ICD-10-CM | POA: Diagnosis not present

## 2022-04-08 ENCOUNTER — Encounter: Payer: Self-pay | Admitting: Cardiology

## 2022-04-08 NOTE — Progress Notes (Signed)
Cardiology Office Note   Date:  04/09/2022   ID:  Onnie Hatchel, DOB [birth year], MRN [id number]  PCP:  [name], MD  Cardiologist:   Minus Breeding, MD   Chief Complaint  Patient presents with   Palpitations     History of Present Illness: Kevin Merritt is a 71 y.o. male who was previously seen by Dr. Ellyn Hack in 2015.  Myoview obtained in 10/2013 was low risk.  Echocardiogram at the time showed moderate LVH, EF 65 to 70%, grade 1 DD, trivial AI.  The patient was admitted to the hospital on 02/01/2020 with increasing dizziness and syncope.  EMS noted he was in new atrial fibrillation with RVR.  On arrival to the ED, he was also found to have positive orthostasis with systolic blood pressure dropping from 130 down to 84.  Lab work obtained in the ED revealed he was positive for COVID-19.   He has a thoracic aneurysm of 4.3 cm.   He wore a monitor and was found to have NSVT.  The rhythm was sinus.  There was sinus tachycardia.  There was some baseline artifact.  He did have a 6-second run of nonsustained ventricular tachycardia and other shorter runs.  Of note during his evaluation in the hospital he had an echocardiogram which demonstrated an EF of 60 to 65%.  There was some moderate concentric left ventricular hypertrophy.  He wore a heart monitor the showed 6-second run of nonsustained VT, underlying rhythm was sinus rhythm. Given the nonsustained VT, a  Myoview was ordered.  Myoview performed on 03/23/2020 showed EF 50%, no evidence of ischemia or infarction, overall low risk test.    Since I last saw him he saw Dr. Fletcher Anon and it was decided to manage his PVD medically.  Since he was last seen he has done well.  He does work in the yard and did little digging.  He actually put some pavers down for a walkway. The patient denies any new symptoms such as chest discomfort, neck or arm discomfort. There has been no new shortness of breath, PND or orthopnea. There have been no reported  palpitations, presyncope or syncope.   Past Medical History:  Diagnosis Date   CKD (chronic kidney disease), stage III (HCC)    High cholesterol    Hypertension    Prostate cancer ([place])    PVD (peripheral vascular disease) ([place])     Past Surgical History:  Procedure Laterality Date   COLONOSCOPY W/ POLYPECTOMY  2012   HERNIA REPAIR Right    Age 79   NM MYOVIEW LTD  10/15/2013   7 min, 8.5 METs, 57%, ASx ST depressions - No ischemia / infarction.   THYROIDECTOMY     Age 22   TONSILLECTOMY     Age 44   TRANSTHORACIC ECHOCARDIOGRAM  10/28/2013   Nl LV Size & function; EF 65-70%, mod Conc LVH with Gd 2 DD     Current Outpatient Medications  Medication Sig Dispense Refill   albuterol (VENTOLIN HFA) 108 (90 Base) MCG/ACT inhaler      aspirin EC 81 MG tablet Take 81 mg by mouth daily. Swallow whole.     chlorthalidone (HYGROTON) 25 MG tablet Take 25 mg by mouth daily.     diltiazem (CARDIZEM CD) 120 MG 24 hr capsule Take 1 capsule (120 mg total) by mouth daily. 90 capsule 3   diltiazem (DILACOR XR) 120 MG 24 hr capsule Take 120 mg by mouth daily.  furosemide (LASIX) 40 MG tablet      leuprolide, 6 Month, (ELIGARD) 45 MG injection Inject 45 mg into the skin every 6 (six) months.     tamsulosin (FLOMAX) 0.4 MG CAPS capsule Take 1 capsule (0.4 mg total) by mouth daily after supper. 30 capsule 0   telmisartan (MICARDIS) 40 MG tablet Take 40 mg by mouth daily.     rosuvastatin (CRESTOR) 20 MG tablet Take 1 tablet (20 mg total) by mouth daily. 90 tablet 3   No current facility-administered medications for this visit.    Allergies:   Shellfish allergy    ROS:  Please see the history of present illness.   Otherwise, review of systems are positive for none.   All other systems are reviewed and negative.    PHYSICAL EXAM: VS:  BP (!) 160/82   Pulse 73   Ht '6\' 5"'$  (1.956 m)   Wt 264 lb 9.6 oz (120 kg)   SpO2 95%   BMI 31.38 kg/m  , BMI Body mass index is 31.38 kg/m. GENERAL:   Well appearing NECK:  No jugular venous distention, waveform within normal limits, carotid upstroke brisk and symmetric, no bruits, no thyromegaly LUNGS:  Clear to auscultation bilaterally CHEST:  Unremarkable HEART:  PMI not displaced or sustained,S1 and S2 within normal limits, no S3, no S4, no clicks, no rubs, no murmurs ABD:  Flat, positive bowel sounds normal in frequency in pitch, no bruits, no rebound, no guarding, no midline pulsatile mass, no hepatomegaly, no splenomegaly EXT:  2 plus pulses throughout, no edema, no cyanosis no clubbing   EKG:  EKG is  ordered today. Normal sinus rhythm, rate 73, premature atrial contractions, first-degree AV block, RSR prime V1 and V2, no acute ST-T wave changes.  Recent Labs: No results found for requested labs within last 365 days.    Lipid Panel No results found for: "CHOL", "TRIG", "HDL", "CHOLHDL", "VLDL", "LDLCALC", "LDLDIRECT"    Wt Readings from Last 3 Encounters:  04/09/22 264 lb 9.6 oz (120 kg)  10/25/20 263 lb 3.2 oz (119.4 kg)  10/06/20 267 lb 3.2 oz (121.2 kg)      Other studies Reviewed: Additional studies/ records that were reviewed today include:  Labs Review of the above records demonstrates:  Please see elsewhere in the note.     ASSESSMENT AND PLAN:  Edema:   This is improved.  He will continue with the Lasix.  No change in therapy.  Paroxysmal atrial fibrillation/flutter:   He has had no new symptoms.  No change in therapy.   Hypertension:  The BP is not at target.  He is given a blood pressure diary and he will return this to me in a couple of weeks.  He most likely will need med titration.   Hyperlipidemia:    LDL was 168 after switching to Crestor earlier this year because of some memory issues.  I am going to take the liberty of increasing that to 20 mg daily and he can get a fasting lipid profile with his primary provider.    CKD stage III:   Creat was down to 1.16 most recently.  No change in therapy.    NSVT:    He will continue with the meds as listed.  He is not bothered by this.  Thoracic aortic aneurysm:   This was 43 mm in 2021 September.  I will repeat a CT aorta.   Leg pain:    this is being managed conservatively.  See above   current medicines are reviewed at length with the patient today.  The patient does not have concerns regarding medicines.  The following changes have been made:   None  Labs/ tests ordered today include:     Orders Placed This Encounter  Procedures   CT ANGIO CHEST AORTA W/CM & OR WO/CM   Basic metabolic panel   EKG [id number]     Disposition:   FU with me 12 months.   Signed, Minus Breeding, MD  04/09/2022 10:10 AM    [facility]

## 2022-04-09 ENCOUNTER — Ambulatory Visit: Payer: Medicare HMO | Attending: Cardiology | Admitting: Cardiology

## 2022-04-09 ENCOUNTER — Encounter: Payer: Self-pay | Admitting: Cardiology

## 2022-04-09 VITALS — BP 160/82 | HR 73 | Ht 77.0 in | Wt 264.6 lb

## 2022-04-09 DIAGNOSIS — I7121 Aneurysm of the ascending aorta, without rupture: Secondary | ICD-10-CM

## 2022-04-09 DIAGNOSIS — I480 Paroxysmal atrial fibrillation: Secondary | ICD-10-CM

## 2022-04-09 DIAGNOSIS — N1831 Chronic kidney disease, stage 3a: Secondary | ICD-10-CM | POA: Diagnosis not present

## 2022-04-09 DIAGNOSIS — I10 Essential (primary) hypertension: Secondary | ICD-10-CM | POA: Diagnosis not present

## 2022-04-09 DIAGNOSIS — E785 Hyperlipidemia, unspecified: Secondary | ICD-10-CM

## 2022-04-09 DIAGNOSIS — I4729 Other ventricular tachycardia: Secondary | ICD-10-CM

## 2022-04-09 MED ORDER — ROSUVASTATIN CALCIUM 20 MG PO TABS: 20.0000 mg | ORAL_TABLET | Freq: Every day | ORAL | 3 refills | Status: DC

## 2022-04-09 NOTE — Patient Instructions (Signed)
Medication Instructions:  Your physician has recommended you make the following change in your medication:   -Increase rosuvastatin (crestor) to '20mg'$  once daily.  *If you need a refill on your cardiac medications before your next appointment, please call your pharmacy*   Lab Work: Your physician recommends that you labs drawn today: BMET  If you have labs (blood work) drawn today and your tests are completely normal, you will receive your results only by: MyChart Message (if you have MyChart) OR A paper copy in the mail If you have any lab test that is abnormal or we need to change your treatment, we will call you to review the results.   Testing/Procedures: Non-Cardiac CT Angiography (CTA) aorta, is a special type of CT scan that uses a computer to produce multi-dimensional views of major blood vessels throughout the body. In CT angiography, a contrast material is injected through an IV to help visualize the blood vessels This will be done at [place] [address]    Follow-Up: At [facility], you and your health needs are our priority.  As part of our continuing mission to provide you with exceptional heart care, we have created designated Provider Care Teams.  These Care Teams include your primary Cardiologist (physician) and Advanced Practice Providers (APPs -  Physician Assistants and Nurse Practitioners) who all work together to provide you with the care you need, when you need it.  We recommend signing up for the patient portal called "MyChart".  Sign up information is provided on this After Visit Summary.  MyChart is used to connect with patients for Virtual Visits (Telemedicine).  Patients are able to view lab/test results, encounter notes, upcoming appointments, etc.  Non-urgent messages can be sent to your provider as well.   To learn more about what you can do with MyChart, go to [email or web address].    Your next appointment:   12  month(s)  The format for your next appointment:   In Person  Provider:   Minus Breeding, MD      Other Instructions Dr. Percival Spanish would like for you to keep a blood pressure log and return to him in 2-3 weeks.  HOW TO TAKE YOUR BLOOD PRESSURE: Rest 5 minutes before taking your blood pressure. Don't smoke or drink caffeinated beverages for at least 30 minutes before. Take your blood pressure before (not after) you eat. Sit comfortably with your back supported and both feet on the floor (don't cross your legs). Elevate your arm to heart level on a table or a desk. Use the proper sized cuff. It should fit smoothly and snugly around your bare upper arm. There should be enough room to slip a fingertip under the cuff. The bottom edge of the cuff should be 1 inch above the crease of the elbow. Ideally, take 3 measurements at one sitting and record the average.

## 2022-04-10 LAB — BASIC METABOLIC PANEL
BUN/Creatinine Ratio: 9 — ABNORMAL LOW (ref 10–24)
BUN: 11 mg/dL (ref 8–27)
CO2: 25 mmol/L (ref 20–29)
Calcium: 9.7 mg/dL (ref 8.6–10.2)
Chloride: 102 mmol/L (ref 96–106)
Creatinine, Ser: 1.16 mg/dL (ref 0.76–1.27)
Glucose: 91 mg/dL (ref 70–99)
Potassium: 4.4 mmol/L (ref 3.5–5.2)
Sodium: 142 mmol/L (ref 134–144)
eGFR: 67 mL/min/{1.73_m2} (ref >59)

## 2022-04-11 DIAGNOSIS — H25811 Combined forms of age-related cataract, right eye: Secondary | ICD-10-CM | POA: Diagnosis not present

## 2022-04-11 DIAGNOSIS — H269 Unspecified cataract: Secondary | ICD-10-CM | POA: Diagnosis not present

## 2022-04-17 DIAGNOSIS — E669 Obesity, unspecified: Secondary | ICD-10-CM | POA: Diagnosis not present

## 2022-04-17 DIAGNOSIS — N182 Chronic kidney disease, stage 2 (mild): Secondary | ICD-10-CM | POA: Diagnosis not present

## 2022-04-17 DIAGNOSIS — I10 Essential (primary) hypertension: Secondary | ICD-10-CM | POA: Diagnosis not present

## 2022-04-17 DIAGNOSIS — Z6832 Body mass index (BMI) 32.0-32.9, adult: Secondary | ICD-10-CM | POA: Diagnosis not present

## 2022-04-17 DIAGNOSIS — E782 Mixed hyperlipidemia: Secondary | ICD-10-CM | POA: Diagnosis not present

## 2022-05-01 ENCOUNTER — Ambulatory Visit: Payer: Medicare HMO | Attending: Cardiovascular Disease | Admitting: Cardiovascular Disease

## 2022-05-01 ENCOUNTER — Encounter: Payer: Self-pay | Admitting: Cardiovascular Disease

## 2022-05-01 VITALS — BP 144/70 | HR 82 | Ht 77.0 in | Wt 256.4 lb

## 2022-05-01 DIAGNOSIS — I10 Essential (primary) hypertension: Secondary | ICD-10-CM

## 2022-05-01 DIAGNOSIS — I739 Peripheral vascular disease, unspecified: Secondary | ICD-10-CM

## 2022-05-01 DIAGNOSIS — E785 Hyperlipidemia, unspecified: Secondary | ICD-10-CM

## 2022-05-01 DIAGNOSIS — I480 Paroxysmal atrial fibrillation: Secondary | ICD-10-CM | POA: Diagnosis not present

## 2022-05-01 DIAGNOSIS — I712 Thoracic aortic aneurysm, without rupture, unspecified: Secondary | ICD-10-CM

## 2022-05-01 NOTE — Progress Notes (Signed)
Cardiology Office Note   Date:  05/01/2022   ID:  Kevin Merritt, DOB 1950-09-20, MRN [id number]  PCP:  Kevin Carol, MD  Cardiologist:  Dr. Percival Merritt  No chief complaint on file.      History of Present Illness: Kevin Merritt is a 71 y.o. male who is here today for follow-up visit regarding peripheral arterial disease.   He has known history of paroxysmal atrial fibrillation, orthostatic hypotension, thoracic aortic aneurysm of 4.3 cm, chronic kidney disease, essential hypertension, hyperlipidemia and prostate cancer. He had significant leg swelling and thus was switched from diltiazem to Toprol.  However, he did not tolerate Toprol due to increased shortness of breath and was placed back on diltiazem at a reduced dose.  He was seen by me in June 2022 for peripheral arterial disease.  He complained of bilateral discomfort at the ankle slightly worse on the right side.  Symptoms were not exertional. He had  noninvasive vascular studies which showed an ABI of 0.78 on the right and 0.91 on the left.  Duplex showed no significant infrainguinal disease with the exception of distal right ATA occlusion.  He has been doing very well with no recent chest pain, shortness of breath or palpitations.  No lower extremity claudication and no significant lower extremity edema.  Past Medical History:  Diagnosis Date   CKD (chronic kidney disease), stage III (HCC)    High cholesterol    Hypertension    Prostate cancer ([place])    PVD (peripheral vascular disease) ([place])     Past Surgical History:  Procedure Laterality Date   COLONOSCOPY W/ POLYPECTOMY  2012   HERNIA REPAIR Right    Age 65   NM MYOVIEW LTD  10/15/2013   7 min, 8.5 METs, 57%, ASx ST depressions - No ischemia / infarction.   THYROIDECTOMY     Age 57   TONSILLECTOMY     Age 1   TRANSTHORACIC ECHOCARDIOGRAM  10/28/2013   Nl LV Size & function; EF 65-70%, mod Conc LVH with Gd 2 DD     Current Outpatient Medications   Medication Sig Dispense Refill   albuterol (VENTOLIN HFA) 108 (90 Base) MCG/ACT inhaler      aspirin EC 81 MG tablet Take 81 mg by mouth daily. Swallow whole.     chlorthalidone (HYGROTON) 25 MG tablet Take 25 mg by mouth daily.     diltiazem (CARDIZEM CD) 120 MG 24 hr capsule Take 1 capsule (120 mg total) by mouth daily. 90 capsule 3   diltiazem (DILACOR XR) 120 MG 24 hr capsule Take 120 mg by mouth daily.     furosemide (LASIX) 40 MG tablet      leuprolide, 6 Month, (ELIGARD) 45 MG injection Inject 45 mg into the skin every 6 (six) months.     rosuvastatin (CRESTOR) 20 MG tablet Take 1 tablet (20 mg total) by mouth daily. 90 tablet 3   tamsulosin (FLOMAX) 0.4 MG CAPS capsule Take 1 capsule (0.4 mg total) by mouth daily after supper. 30 capsule 0   telmisartan (MICARDIS) 40 MG tablet Take 40 mg by mouth daily.     No current facility-administered medications for this visit.    Allergies:   Shellfish allergy    Social History:  The patient  reports that he has never smoked. He has never used smokeless tobacco. He reports that he does not drink alcohol and does not use drugs.   Family History:  The patient's family history includes Diabetes (age  of onset: 68) in his sister; Heart failure (age of onset: 6) in his mother; Hypertension in his mother and sister.    ROS:  Please see the history of present illness.   Otherwise, review of systems are positive for none.   All other systems are reviewed and negative.    PHYSICAL EXAM: VS:  BP (!) 144/70   Pulse 82   Ht '6\' 5"'$  (1.956 m)   Wt 256 lb 6.4 oz (116.3 kg)   SpO2 97%   BMI 30.40 kg/m  , BMI Body mass index is 30.4 kg/m. GEN: Well nourished, well developed, in no acute distress  HEENT: normal  Neck: no JVD, carotid bruits, or masses Cardiac: RRR; no  rubs, or gallops, 1 / 6 systolic murmur in the aortic area.  Mild bilateral leg edema Respiratory:  clear to auscultation bilaterally, normal work of breathing GI: soft,  nontender, nondistended, + BS MS: no deformity or atrophy  Skin: warm and dry, no rash Neuro:  Strength and sensation are intact Psych: euthymic mood, full affect Vascular: Femoral pulses normal bilaterally.  Posterior tibial is palpable on both sides.   EKG:  EKG is not ordered today.    Recent Labs: 04/09/2022: BUN 11; Creatinine, Ser 1.16; Potassium 4.4; Sodium 142    Lipid Panel No results found for: "CHOL", "TRIG", "HDL", "CHOLHDL", "VLDL", "LDLCALC", "LDLDIRECT"    Wt Readings from Last 3 Encounters:  05/01/22 256 lb 6.4 oz (116.3 kg)  04/09/22 264 lb 9.6 oz (120 kg)  10/25/20 263 lb 3.2 oz (119.4 kg)          No data to display            ASSESSMENT AND PLAN:  1.  Peripheral arterial disease: The patient does have evidence of peripheral arterial disease based on noninvasive vascular studies.  However, he has no symptoms related to this at the present time and thus I recommend continuing medical therapy and treatment of risk factors.  Continue aspirin 81 mg daily.  I also discussed with him the importance of a daily walking program.  2.  Bilateral leg edema: Suspect a component of chronic venous insufficiency and also treatment with diltiazem is playing a role.  This seems to be better compared to last year.  3.  Paroxysmal atrial fibrillation: He seems to be in sinus rhythm.  4.  Essential hypertension: Blood pressure is reasonably controlled.  5.  Hyperlipidemia: Rosuvastatin was increased recently by Dr. Percival Merritt to 20 mg daily.  Recommended target LDL of less than 70.  6.  Thoracic aortic aneurysm: Agree with periodic monitoring.    Disposition: Continue to follow-up with Dr. Percival Merritt.  The patient can be referred back to me if he develops symptoms related to peripheral arterial disease.  Signed,  Kevin Sacramento, MD  05/01/2022 10:36 AM    [place]

## 2022-05-01 NOTE — Patient Instructions (Signed)
Medication Instructions:  No changes *If you need a refill on your cardiac medications before your next appointment, please call your pharmacy*   Lab Work: None ordered If you have labs (blood work) drawn today and your tests are completely normal, you will receive your results only by: MyChart Message (if you have MyChart) OR A paper copy in the mail If you have any lab test that is abnormal or we need to change your treatment, we will call you to review the results.   Testing/Procedures: None ordered   Follow-Up: At [facility], you and your health needs are our priority.  As part of our continuing mission to provide you with exceptional heart care, we have created designated Provider Care Teams.  These Care Teams include your primary Cardiologist (physician) and Advanced Practice Providers (APPs -  Physician Assistants and Nurse Practitioners) who all work together to provide you with the care you need, when you need it.  We recommend signing up for the patient portal called "MyChart".  Sign up information is provided on this After Visit Summary.  MyChart is used to connect with patients for Virtual Visits (Telemedicine).  Patients are able to view lab/test results, encounter notes, upcoming appointments, etc.  Non-urgent messages can be sent to your provider as well.   To learn more about what you can do with MyChart, go to [email or web address].    Your next appointment:   Follow up as needed with Dr. Arida 

## 2022-05-02 ENCOUNTER — Other Ambulatory Visit: Payer: Medicare HMO

## 2022-05-03 ENCOUNTER — Ambulatory Visit
Admission: RE | Admit: 2022-05-03 | Discharge: 2022-05-03 | Disposition: A | Discharge status: Home / Self Care | Payer: Medicare HMO | Source: Ambulatory Visit | Attending: Cardiology | Admitting: Cardiology

## 2022-05-03 DIAGNOSIS — I480 Paroxysmal atrial fibrillation: Secondary | ICD-10-CM | POA: Diagnosis not present

## 2022-05-03 DIAGNOSIS — I7121 Aneurysm of the ascending aorta, without rupture: Secondary | ICD-10-CM

## 2022-05-03 DIAGNOSIS — N1831 Chronic kidney disease, stage 3a: Secondary | ICD-10-CM

## 2022-05-03 DIAGNOSIS — I712 Thoracic aortic aneurysm, without rupture, unspecified: Secondary | ICD-10-CM | POA: Diagnosis not present

## 2022-05-03 DIAGNOSIS — E785 Hyperlipidemia, unspecified: Secondary | ICD-10-CM

## 2022-05-03 DIAGNOSIS — I2510 Atherosclerotic heart disease of native coronary artery without angina pectoris: Secondary | ICD-10-CM | POA: Diagnosis not present

## 2022-05-03 DIAGNOSIS — I4729 Other ventricular tachycardia: Secondary | ICD-10-CM

## 2022-05-03 DIAGNOSIS — I10 Essential (primary) hypertension: Secondary | ICD-10-CM

## 2022-05-03 DIAGNOSIS — R918 Other nonspecific abnormal finding of lung field: Secondary | ICD-10-CM | POA: Diagnosis not present

## 2022-05-03 MED ORDER — IOPAMIDOL (ISOVUE-370) INJECTION 76%
75.0000 mL | Freq: Once | INTRAVENOUS | Status: AC | PRN
  Administered 2022-05-03: 75 mL via INTRAVENOUS

## 2022-05-09 ENCOUNTER — Other Ambulatory Visit: Payer: Medicare HMO

## 2022-07-13 DIAGNOSIS — H11153 Pinguecula, bilateral: Secondary | ICD-10-CM | POA: Diagnosis not present

## 2022-07-13 DIAGNOSIS — H538 Other visual disturbances: Secondary | ICD-10-CM | POA: Diagnosis not present

## 2022-08-13 DIAGNOSIS — H35351 Cystoid macular degeneration, right eye: Secondary | ICD-10-CM | POA: Diagnosis not present

## 2022-08-13 DIAGNOSIS — Z961 Presence of intraocular lens: Secondary | ICD-10-CM | POA: Diagnosis not present

## 2022-09-25 DIAGNOSIS — E041 Nontoxic single thyroid nodule: Secondary | ICD-10-CM | POA: Diagnosis not present

## 2022-09-25 DIAGNOSIS — I480 Paroxysmal atrial fibrillation: Secondary | ICD-10-CM | POA: Diagnosis not present

## 2022-09-25 DIAGNOSIS — I7121 Aneurysm of the ascending aorta, without rupture: Secondary | ICD-10-CM | POA: Diagnosis not present

## 2022-09-25 DIAGNOSIS — E7800 Pure hypercholesterolemia, unspecified: Secondary | ICD-10-CM | POA: Diagnosis not present

## 2022-09-25 DIAGNOSIS — I10 Essential (primary) hypertension: Secondary | ICD-10-CM | POA: Diagnosis not present

## 2022-09-25 DIAGNOSIS — I739 Peripheral vascular disease, unspecified: Secondary | ICD-10-CM | POA: Diagnosis not present

## 2022-10-26 DIAGNOSIS — C61 Malignant neoplasm of prostate: Secondary | ICD-10-CM | POA: Diagnosis not present

## 2022-11-02 DIAGNOSIS — C61 Malignant neoplasm of prostate: Secondary | ICD-10-CM | POA: Diagnosis not present

## 2022-11-02 DIAGNOSIS — N5201 Erectile dysfunction due to arterial insufficiency: Secondary | ICD-10-CM | POA: Diagnosis not present

## 2022-11-13 DIAGNOSIS — H35351 Cystoid macular degeneration, right eye: Secondary | ICD-10-CM | POA: Diagnosis not present

## 2022-11-14 DIAGNOSIS — H35351 Cystoid macular degeneration, right eye: Secondary | ICD-10-CM | POA: Diagnosis not present

## 2022-12-28 DIAGNOSIS — H35351 Cystoid macular degeneration, right eye: Secondary | ICD-10-CM | POA: Diagnosis not present

## 2023-01-22 DIAGNOSIS — H26491 Other secondary cataract, right eye: Secondary | ICD-10-CM | POA: Diagnosis not present

## 2023-02-18 DIAGNOSIS — I10 Essential (primary) hypertension: Secondary | ICD-10-CM | POA: Diagnosis not present

## 2023-02-18 DIAGNOSIS — R0600 Dyspnea, unspecified: Secondary | ICD-10-CM | POA: Diagnosis not present

## 2023-02-18 DIAGNOSIS — E7800 Pure hypercholesterolemia, unspecified: Secondary | ICD-10-CM | POA: Diagnosis not present

## 2023-02-18 DIAGNOSIS — J45909 Unspecified asthma, uncomplicated: Secondary | ICD-10-CM | POA: Diagnosis not present

## 2023-02-27 ENCOUNTER — Ambulatory Visit
Admission: RE | Admit: 2023-02-27 | Discharge: 2023-02-27 | Disposition: A | Discharge status: Home / Self Care | Payer: Medicare HMO | Source: Ambulatory Visit | Attending: Internal Medicine | Admitting: Internal Medicine

## 2023-02-27 ENCOUNTER — Other Ambulatory Visit: Payer: Self-pay | Admitting: Internal Medicine

## 2023-02-27 DIAGNOSIS — J45909 Unspecified asthma, uncomplicated: Secondary | ICD-10-CM

## 2023-02-27 DIAGNOSIS — J90 Pleural effusion, not elsewhere classified: Secondary | ICD-10-CM | POA: Diagnosis not present

## 2023-02-27 DIAGNOSIS — I517 Cardiomegaly: Secondary | ICD-10-CM | POA: Diagnosis not present

## 2023-02-27 DIAGNOSIS — R0600 Dyspnea, unspecified: Secondary | ICD-10-CM

## 2023-03-20 DIAGNOSIS — Z0100 Encounter for examination of eyes and vision without abnormal findings: Secondary | ICD-10-CM | POA: Diagnosis not present

## 2023-04-04 ENCOUNTER — Other Ambulatory Visit: Payer: Self-pay | Admitting: *Deleted

## 2023-04-04 DIAGNOSIS — I712 Thoracic aortic aneurysm, without rupture, unspecified: Secondary | ICD-10-CM

## 2023-04-13 NOTE — Progress Notes (Unsigned)
Cardiology Office Note:   Date:  04/16/2023  ID:  Kevin Merritt, DOB [birth year], MRN [id number] PCP: Renford Dills, MD  [facility]  Cardiologist:  Rollene Rotunda, MD {  History of Present Illness:   Kevin Merritt is a 72 y.o. male who was previously seen by Dr. Herbie Baltimore in 2015.  Myoview obtained in 10/2013 was low risk.  Echocardiogram at the time showed moderate LVH, EF 65 to 70%, grade 1 DD, trivial AI.  The patient was admitted to the hospital on 02/01/2020 with increasing dizziness and syncope.  EMS noted he was in new atrial fibrillation with RVR.  On arrival to the ED, he was also found to have positive orthostasis with systolic blood pressure dropping from 130 down to 84.  Lab work obtained in the ED revealed he was positive for COVID-19.   He has a thoracic aneurysm of 4.3 cm.   He wore a monitor and was found to have NSVT.  The rhythm was sinus.  There was sinus tachycardia.  There was some baseline artifact.  He did have a 6-second run of nonsustained ventricular tachycardia and other shorter runs.  Of note during his evaluation in the hospital he had an echocardiogram which demonstrated an EF of 60 to 65%.  There was some moderate concentric left ventricular hypertrophy.  He wore a heart monitor the showed 6-second run of nonsustained VT, underlying rhythm was sinus rhythm. Given the nonsustained VT, a  Myoview was ordered.  Myoview performed on 03/23/2020 showed EF 50%, no evidence of ischemia or infarction, overall low risk test.     Since I last saw him he has done well.  The patient denies any new symptoms such as chest discomfort, neck or arm discomfort. There has been no new shortness of breath, PND or orthopnea. There have been no reported palpitations, presyncope or syncope.  He is not exercising as much as he should.  When he does exercise he walks about 2 miles at a time and he has no symptoms related to this.  He has had memory problems which he ascribed first to  Lipitor and Crestor.  He is think is improved off of statins and he is going to talk to his primary care doctor about this.  ROS: As stated in the HPI and negative for all other systems.  Studies Reviewed:    EKG:         Risk Assessment/Calculations:          Physical Exam:   VS:  BP (!) 180/70   Pulse 81   SpO2 96%    Wt Readings from Last 3 Encounters:  05/01/22 256 lb 6.4 oz (116.3 kg)  04/09/22 264 lb 9.6 oz (120 kg)  10/25/20 263 lb 3.2 oz (119.4 kg)     GEN: Well nourished, well developed in no acute distress NECK: No JVD; No carotid bruits CARDIAC: RRR, 2/6 left up to sternal border, no diastolic murmurs, rubs, gallops RESPIRATORY:  Clear to auscultation without rales, wheezing or rhonchi  ABDOMEN: Soft, non-tender, non-distended EXTREMITIES:  No edema; No deformity   ASSESSMENT AND PLAN:      Paroxysmal atrial fibrillation/flutter:  He has had no recurrence of this.  This was limited.  No indication for DOAC.   Hypertension:  The BP is not at target.  And can increase his Cardizem to 240 mg daily and he will keep a blood pressure diary.   Hyperlipidemia:    LDL was 170 most recently.  He  is going to see his primary provider tomorrow and I have suggested a PCSK9 inhibitor.   CKD stage III:   Creat was 0.98 recently.  No change in therapy.   NSVT:    He is not feeling any arrhythmias.  No change in therapy.   Thoracic aortic aneurysm:   This was 43 mm last year.  He will have a repeat CT next month.   Leg pain:    He has PVD and is being managed medically for this.  He needs better risk reduction with cholesterol management and blood pressure control  Thyroid: There was a possible thyroid nodule mentioned on the CT and should be followed up at the time of his upcoming CT.  He might need dedicated thyroid ultrasound.   Current medicines are reviewed at length with the patient today.  The patient does not have concerns regarding medicines.     Follow up with  me in one year.   Signed, Rollene Rotunda, MD

## 2023-04-15 DIAGNOSIS — Z961 Presence of intraocular lens: Secondary | ICD-10-CM | POA: Diagnosis not present

## 2023-04-15 DIAGNOSIS — H5211 Myopia, right eye: Secondary | ICD-10-CM | POA: Diagnosis not present

## 2023-04-15 DIAGNOSIS — H04123 Dry eye syndrome of bilateral lacrimal glands: Secondary | ICD-10-CM | POA: Diagnosis not present

## 2023-04-15 DIAGNOSIS — H35351 Cystoid macular degeneration, right eye: Secondary | ICD-10-CM | POA: Diagnosis not present

## 2023-04-16 ENCOUNTER — Encounter: Payer: Self-pay | Admitting: Cardiology

## 2023-04-16 ENCOUNTER — Ambulatory Visit: Payer: Medicare HMO | Attending: Cardiology | Admitting: Cardiology

## 2023-04-16 VITALS — BP 180/70 | HR 81 | Ht 77.0 in | Wt 254.0 lb

## 2023-04-16 DIAGNOSIS — E785 Hyperlipidemia, unspecified: Secondary | ICD-10-CM

## 2023-04-16 DIAGNOSIS — I10 Essential (primary) hypertension: Secondary | ICD-10-CM

## 2023-04-16 DIAGNOSIS — I4729 Other ventricular tachycardia: Secondary | ICD-10-CM

## 2023-04-16 DIAGNOSIS — N1831 Chronic kidney disease, stage 3a: Secondary | ICD-10-CM | POA: Diagnosis not present

## 2023-04-16 DIAGNOSIS — I480 Paroxysmal atrial fibrillation: Secondary | ICD-10-CM

## 2023-04-16 MED ORDER — DILTIAZEM HCL ER COATED BEADS 240 MG PO CP24: 240.0000 mg | ORAL_CAPSULE | Freq: Every day | ORAL | 3 refills | Status: DC

## 2023-04-16 NOTE — Patient Instructions (Signed)
Medication Instructions:  Start taking 2 tablets of your 120 mg diltiazem until current bottle finished. Then start new script for 240 mg Diltiazem 240 mg one tablet daily. New script sent. *If you need a refill on your cardiac medications before your next appointment, please call your pharmacy*   Follow-Up: At [facility], you and your health needs are our priority.  As part of our continuing mission to provide you with exceptional heart care, we have created designated Provider Care Teams.  These Care Teams include your primary Cardiologist (physician) and Advanced Practice Providers (APPs -  Physician Assistants and Nurse Practitioners) who all work together to provide you with the care you need, when you need it.  Your next appointment:   12 month(s)  Provider:   Rollene Rotunda, MD     Other Instructions Take and record blood pressure 2-3x daily for 2 weeks on BP log form provided by office nurse. Please return the completed forms to office for review.

## 2023-04-17 ENCOUNTER — Other Ambulatory Visit: Payer: Self-pay | Admitting: Internal Medicine

## 2023-04-17 DIAGNOSIS — R0989 Other specified symptoms and signs involving the circulatory and respiratory systems: Secondary | ICD-10-CM | POA: Diagnosis not present

## 2023-04-17 DIAGNOSIS — C61 Malignant neoplasm of prostate: Secondary | ICD-10-CM | POA: Diagnosis not present

## 2023-04-17 DIAGNOSIS — I739 Peripheral vascular disease, unspecified: Secondary | ICD-10-CM | POA: Diagnosis not present

## 2023-04-17 DIAGNOSIS — I7121 Aneurysm of the ascending aorta, without rupture: Secondary | ICD-10-CM | POA: Diagnosis not present

## 2023-04-17 DIAGNOSIS — I480 Paroxysmal atrial fibrillation: Secondary | ICD-10-CM | POA: Diagnosis not present

## 2023-04-17 DIAGNOSIS — I10 Essential (primary) hypertension: Secondary | ICD-10-CM | POA: Diagnosis not present

## 2023-04-17 DIAGNOSIS — E7800 Pure hypercholesterolemia, unspecified: Secondary | ICD-10-CM | POA: Diagnosis not present

## 2023-04-17 DIAGNOSIS — Z0000 Encounter for general adult medical examination without abnormal findings: Secondary | ICD-10-CM | POA: Diagnosis not present

## 2023-04-17 DIAGNOSIS — I5189 Other ill-defined heart diseases: Secondary | ICD-10-CM | POA: Diagnosis not present

## 2023-04-23 ENCOUNTER — Ambulatory Visit
Admission: RE | Admit: 2023-04-23 | Discharge: 2023-04-23 | Disposition: A | Discharge status: Home / Self Care | Payer: Medicare HMO | Source: Ambulatory Visit | Attending: Internal Medicine | Admitting: Internal Medicine

## 2023-04-23 ENCOUNTER — Ambulatory Visit
Admission: RE | Admit: 2023-04-23 | Discharge: 2023-04-23 | Disposition: A | Discharge status: Home / Self Care | Payer: Medicare HMO | Source: Ambulatory Visit | Attending: Cardiology | Admitting: Cardiology

## 2023-04-23 DIAGNOSIS — R0989 Other specified symptoms and signs involving the circulatory and respiratory systems: Secondary | ICD-10-CM

## 2023-04-23 DIAGNOSIS — I712 Thoracic aortic aneurysm, without rupture, unspecified: Secondary | ICD-10-CM | POA: Diagnosis not present

## 2023-04-23 DIAGNOSIS — I739 Peripheral vascular disease, unspecified: Secondary | ICD-10-CM | POA: Diagnosis not present

## 2023-04-23 DIAGNOSIS — I700 Atherosclerosis of aorta: Secondary | ICD-10-CM | POA: Diagnosis not present

## 2023-04-23 MED ORDER — IOPAMIDOL (ISOVUE-370) INJECTION 76%
75.0000 mL | Freq: Once | INTRAVENOUS | Status: AC | PRN
  Administered 2023-04-23: 75 mL via INTRAVENOUS

## 2023-04-29 DIAGNOSIS — C61 Malignant neoplasm of prostate: Secondary | ICD-10-CM | POA: Diagnosis not present

## 2023-05-03 ENCOUNTER — Other Ambulatory Visit: Payer: Self-pay | Admitting: *Deleted

## 2023-05-03 DIAGNOSIS — I712 Thoracic aortic aneurysm, without rupture, unspecified: Secondary | ICD-10-CM

## 2023-05-13 DIAGNOSIS — N401 Enlarged prostate with lower urinary tract symptoms: Secondary | ICD-10-CM | POA: Diagnosis not present

## 2023-05-13 DIAGNOSIS — R351 Nocturia: Secondary | ICD-10-CM | POA: Diagnosis not present

## 2023-05-13 DIAGNOSIS — N5201 Erectile dysfunction due to arterial insufficiency: Secondary | ICD-10-CM | POA: Diagnosis not present

## 2023-05-13 DIAGNOSIS — C61 Malignant neoplasm of prostate: Secondary | ICD-10-CM | POA: Diagnosis not present

## 2023-05-20 ENCOUNTER — Other Ambulatory Visit: Payer: Self-pay

## 2023-05-20 ENCOUNTER — Telehealth: Payer: Self-pay | Admitting: Cardiology

## 2023-05-20 DIAGNOSIS — I739 Peripheral vascular disease, unspecified: Secondary | ICD-10-CM

## 2023-05-20 NOTE — Telephone Encounter (Signed)
 Paper Work Dropped Off: patient dropped off Blood Pressure Readings  Date: 05/20/2023  Location of paper:  Placed in Dr. Antoine Poche Mailbox

## 2023-05-30 ENCOUNTER — Ambulatory Visit (HOSPITAL_COMMUNITY)
Admission: RE | Admit: 2023-05-30 | Discharge: 2023-05-30 | Disposition: A | Discharge status: Home / Self Care | Payer: Medicare HMO | Source: Ambulatory Visit | Attending: Vascular Surgery | Admitting: Vascular Surgery

## 2023-05-30 ENCOUNTER — Encounter: Payer: Self-pay | Admitting: Vascular Surgery

## 2023-05-30 ENCOUNTER — Ambulatory Visit: Payer: Medicare HMO | Admitting: Vascular Surgery

## 2023-05-30 VITALS — BP 136/76 | HR 61 | Temp 98.2°F | Resp 20 | Ht 77.0 in | Wt 246.0 lb

## 2023-05-30 DIAGNOSIS — I739 Peripheral vascular disease, unspecified: Secondary | ICD-10-CM | POA: Diagnosis not present

## 2023-05-30 LAB — VAS US ABI WITH/WO TBI
Left ABI: 0.91
Right ABI: 0.74

## 2023-05-30 NOTE — Progress Notes (Signed)
Office Note     CC:  Decreased pulses Requesting Provider:  Renford Dills, MD  HPI: Kevin Merritt is a 73 y.o. (07/08/1950) male presenting at the request of .Renford Dills, MD for decreased pulses.  On exam, Kevin Merritt was doing well, coming by his wife.  Originally from [place], he immig[place] the [place].  He met his wife at a retreat, and they have been married for over 23 years.  Kevin Merritt worked in Psychologist, clinical, and is now retired.  He enjoys photography, and computers.  He admits to not being as active as he should be, but plans to increase his activity level.  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  Kevin Merritt notes swelling in his lower extremities when he is less active, and sits around the house.  Less swelling when he is active, ambulating.  Denies symptoms of claudication, ischemic rest pain, tissue loss.  The pt is  on a statin for cholesterol management.  The pt is  on a daily aspirin.   Other AC:  - The pt is  on medication for hypertension.   The pt is - diabetic.  Tobacco hx:  -  Past Medical History:  Diagnosis Date   CKD (chronic kidney disease), stage III (HCC)    High cholesterol    Hypertension    Prostate cancer (HCC)    PVD (peripheral vascular disease) (HCC)     Past Surgical History:  Procedure Laterality Date   COLONOSCOPY W/ POLYPECTOMY  2012   HERNIA REPAIR Right    Age 63   NM MYOVIEW LTD  10/15/2013   7 min, 8.5 METs, 57%, ASx ST depressions - No ischemia / infarction.   THYROIDECTOMY     Age 5   TONSILLECTOMY     Age 12   TRANSTHORACIC ECHOCARDIOGRAM  10/28/2013   Nl LV Size & function; EF 65-70%, mod Conc LVH with Gd 2 DD    Social History   Socioeconomic History   Marital status: Married    Spouse name: Not on file   Number of children: Not on file   Years of education: Not on file   Highest education level: Not on file  Occupational History   Not on file  Tobacco Use   Smoking status: Never   Smokeless  tobacco: Never  Substance and Sexual Activity   Alcohol use: No   Drug use: No   Sexual activity: Not on file  Other Topics Concern   Not on file  Social History Narrative   He is remarried, currently living with his second wife he has total of 6 children; 3 live with them. (5 sons and one daughter) One grandchild. 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 He went to college. He never smoked and does not drink alcohol.   He is a native of [place]. [place].   Social Drivers of Corporate investment banker Strain: Not on file  Food Insecurity: Not on file  Transportation Needs: Not on file  Physical Activity: Not on file  Stress: Not on file  Social Connections: Unknown (09/26/2021)   Received from [facility]   Social Network    Social Network: Not on file  Intimate Partner Violence: Unknown (08/18/2021)   Received from [facility], [facility]   HITS    Physically Hurt: Not on file    Insult or Talk Down To: Not on file    Threaten  Physical Harm: Not on file    Scream or Curse: Not on file   Family History  Problem Relation Age of Onset   Hypertension Mother    Heart failure Mother 67       details unclear per pt   Diabetes Sister 39   Hypertension Sister     Current Outpatient Medications  Medication Sig Dispense Refill   albuterol (VENTOLIN HFA) 108 (90 Base) MCG/ACT inhaler      aspirin EC 81 MG tablet Take 81 mg by mouth daily. Swallow whole.     chlorthalidone (HYGROTON) 25 MG tablet Take 25 mg by mouth daily.     diltiazem (CARDIZEM CD) 240 MG 24 hr capsule Take 1 capsule (240 mg total) by mouth daily. 90 capsule 3   fluorometholone (FML) 0.1 % ophthalmic suspension 1 drop 2 (two) times daily.     furosemide (LASIX) 40 MG tablet      rosuvastatin (CRESTOR) 20 MG tablet Take 1 tablet (20 mg total) by mouth daily. 90 tablet 3   tamsulosin (FLOMAX) 0.4 MG CAPS capsule Take 1 capsule (0.4 mg total) by mouth daily after supper. 30 capsule 0    telmisartan (MICARDIS) 40 MG tablet Take 40 mg by mouth daily.     No current facility-administered medications for this visit.    Allergies  Allergen Reactions   Atorvastatin     Other Reaction(s): memory changes   Rosuvastatin     Other Reaction(s): memory changes   Shellfish Allergy Nausea And Vomiting     REVIEW OF SYSTEMS:  [X]  denotes positive finding, [ ]  denotes negative finding Cardiac  Comments:  Chest pain or chest pressure:    Shortness of breath upon exertion:    Short of breath when lying flat:    Irregular heart rhythm:        Vascular    Pain in calf, thigh, or hip brought on by ambulation:    Pain in feet at night that wakes you up from your sleep:     Blood clot in your veins:    Leg swelling:         Pulmonary    Oxygen at home:    Productive cough:     Wheezing:         Neurologic    Sudden weakness in arms or legs:     Sudden numbness in arms or legs:     Sudden onset of difficulty speaking or slurred speech:    Temporary loss of vision in one eye:     Problems with dizziness:         Gastrointestinal    Blood in stool:     Vomited blood:         Genitourinary    Burning when urinating:     Blood in urine:        Psychiatric    Major depression:         Hematologic    Bleeding problems:    Problems with blood clotting too easily:        Skin    Rashes or ulcers:        Constitutional    Fever or chills:      PHYSICAL EXAMINATION:  Vitals:   05/30/23 0830  BP: 136/76  Pulse: 61  Resp: 20  Temp: 98.2 F (36.8 C)  SpO2: 97%  Weight: 246 lb (111.6 kg)  Height: 6\' 5"  (1.956 m)    General:  WDWN in NAD;  vital signs documented above Gait: Not observed HENT: WNL, normocephalic Pulmonary: normal non-labored breathing , without wheezing Cardiac: regular HR Abdomen: soft, NT, no masses Skin: without rashes Vascular Exam/Pulses:  Right Left  Radial 2+ (normal) 2+ (normal)  Ulnar    Femoral    Popliteal    DP    PT  2+ (normal) 2+ (normal)   Extremities: without ischemic changes, without Gangrene , without cellulitis; without open wounds;  Musculoskeletal: no muscle wasting or atrophy  Neurologic: A&O X 3;  No focal weakness or paresthesias are detected Psychiatric:  The pt has Normal affect.   Non-Invasive Vascular Imaging:   +-------+-----------+-----------+------------+------------+  ABI/TBIToday's ABIToday's TBIPrevious ABIPrevious TBI  +-------+-----------+-----------+------------+------------+  Right 0.74       0.59       0.74                      +-------+-----------+-----------+------------+------------+  Left  0.91       0.73       0.92                      +-------+-----------+-----------+------------+------------+      ASSESSMENT/PLAN: Kevin Merritt is a 73 y.o. male presenting with asymptomatic peripheral arterial disease.  On exam, he had palpable posterior tibial pulses bilaterally. The ABI demonstrated moderate disease in the right foot, mild disease in the left.  Kevin Merritt and I had a long discussion regarding the above.  I think that as long as he remains active, he does not have significant risk factors which should significantly worsen his PAD.  We discussed the importance of an activity regimen, especially now that he is retired.  His wife is very active, and wants him to participate so that they can travel together.   At this point, Kevin Merritt can follow-up with me as needed. He was asked to call my office should any questions or concerns arise.    Kevin Sparrow, MD [facility] [phone]

## 2023-05-31 ENCOUNTER — Other Ambulatory Visit: Payer: Self-pay

## 2023-06-03 ENCOUNTER — Telehealth: Payer: Self-pay | Admitting: Cardiology

## 2023-06-03 NOTE — Telephone Encounter (Signed)
Patient made aware message below.   Rollene Rotunda, MD Continue that dose and keep a two week diary.

## 2023-06-03 NOTE — Telephone Encounter (Signed)
Patient states that he would like to speak to Dr. Jenene Slicker nurse in regards to BP medication they have discussed previously. Please advise.

## 2023-06-03 NOTE — Telephone Encounter (Signed)
Called and spoke to patient. Patient stated he is currently taking Cardizem 240 mg daily. Instructed patient per message below per Dr Antoine Poche.   Rollene Rotunda, MD  Please clarify the current dose of Cardizem.  If he did not change to 240 we need to update the list and he needs to send [place] a 2 week BP diary take his BP twice daily.

## 2023-06-03 NOTE — Telephone Encounter (Signed)
Called and spoke to patient. Verified name and DOB. Patient called asking to speak to Corrinida and was told she is out of the office today. He would like to let her and Dr Antoine Poche know that his BP has come down.Patient report he was seen by Dr Karin Lieu on 1/16 and his BP was 137/76 at that time. He stated there was mention of starting him on a medication for his BP but could not remember thew name of medication. Patient states he's not sure if he needs medication not that his BP is down.

## 2023-06-18 DIAGNOSIS — H538 Other visual disturbances: Secondary | ICD-10-CM | POA: Diagnosis not present

## 2023-07-29 DIAGNOSIS — I5189 Other ill-defined heart diseases: Secondary | ICD-10-CM | POA: Diagnosis not present

## 2023-07-29 DIAGNOSIS — I480 Paroxysmal atrial fibrillation: Secondary | ICD-10-CM | POA: Diagnosis not present

## 2023-07-29 DIAGNOSIS — R600 Localized edema: Secondary | ICD-10-CM | POA: Diagnosis not present

## 2023-07-30 ENCOUNTER — Ambulatory Visit (HOSPITAL_BASED_OUTPATIENT_CLINIC_OR_DEPARTMENT_OTHER)
Admission: RE | Admit: 2023-07-30 | Discharge: 2023-07-30 | Disposition: A | Discharge status: Home / Self Care | Source: Ambulatory Visit | Attending: Internal Medicine | Admitting: Internal Medicine

## 2023-07-30 ENCOUNTER — Other Ambulatory Visit: Payer: Self-pay | Admitting: Internal Medicine

## 2023-07-30 DIAGNOSIS — R600 Localized edema: Secondary | ICD-10-CM | POA: Insufficient documentation

## 2023-08-05 ENCOUNTER — Other Ambulatory Visit (HOSPITAL_COMMUNITY): Payer: Self-pay | Admitting: Internal Medicine

## 2023-08-05 DIAGNOSIS — R600 Localized edema: Secondary | ICD-10-CM | POA: Diagnosis not present

## 2023-08-05 DIAGNOSIS — E8779 Other fluid overload: Secondary | ICD-10-CM | POA: Diagnosis not present

## 2023-08-05 DIAGNOSIS — I480 Paroxysmal atrial fibrillation: Secondary | ICD-10-CM | POA: Diagnosis not present

## 2023-08-27 DIAGNOSIS — K5900 Constipation, unspecified: Secondary | ICD-10-CM | POA: Diagnosis not present

## 2023-08-27 DIAGNOSIS — E8779 Other fluid overload: Secondary | ICD-10-CM | POA: Diagnosis not present

## 2023-08-27 DIAGNOSIS — R600 Localized edema: Secondary | ICD-10-CM | POA: Diagnosis not present

## 2023-08-27 DIAGNOSIS — I10 Essential (primary) hypertension: Secondary | ICD-10-CM | POA: Diagnosis not present

## 2023-09-04 ENCOUNTER — Ambulatory Visit (HOSPITAL_COMMUNITY): Attending: Cardiovascular Disease

## 2023-09-04 DIAGNOSIS — E8779 Other fluid overload: Secondary | ICD-10-CM | POA: Insufficient documentation

## 2023-09-04 LAB — ECHOCARDIOGRAM COMPLETE
P 1/2 time: 400 ms
S' Lateral: 3.3 cm

## 2023-09-04 MED ORDER — PERFLUTREN LIPID MICROSPHERE
1.0000 mL | INTRAVENOUS | Status: AC | PRN
  Administered 2023-09-04: 2 mL via INTRAVENOUS

## 2023-09-06 ENCOUNTER — Telehealth: Payer: Self-pay | Admitting: Cardiology

## 2023-09-06 DIAGNOSIS — R931 Abnormal findings on diagnostic imaging of heart and coronary circulation: Secondary | ICD-10-CM

## 2023-09-06 NOTE — Telephone Encounter (Signed)
 Patient identification verified by 2 forms. Kevin Duck, RN     Called and spoke to patient  Patient states:  - Based on the results of an ECHO ordered by his PCP the recommendation by Dr. Joice Nares was for the patient to have an MRI completed.               Interventions/Plan: - Encounter forwarded to primary cardiologist for review.    Patient agrees with plan, no questions at this time

## 2023-09-06 NOTE — Telephone Encounter (Signed)
 Pt states Dr Joice Nares ordered an Echo on him and per the results feels like Dr. Lavonne Prairie would need to order an MRI. Please advise

## 2023-09-10 DIAGNOSIS — I517 Cardiomegaly: Secondary | ICD-10-CM | POA: Diagnosis not present

## 2023-09-10 DIAGNOSIS — I10 Essential (primary) hypertension: Secondary | ICD-10-CM | POA: Diagnosis not present

## 2023-09-10 DIAGNOSIS — I77819 Aortic ectasia, unspecified site: Secondary | ICD-10-CM | POA: Diagnosis not present

## 2023-09-10 DIAGNOSIS — E8779 Other fluid overload: Secondary | ICD-10-CM | POA: Diagnosis not present

## 2023-09-10 DIAGNOSIS — I429 Cardiomyopathy, unspecified: Secondary | ICD-10-CM | POA: Diagnosis not present

## 2023-09-10 DIAGNOSIS — R600 Localized edema: Secondary | ICD-10-CM | POA: Diagnosis not present

## 2023-09-10 DIAGNOSIS — I351 Nonrheumatic aortic (valve) insufficiency: Secondary | ICD-10-CM | POA: Diagnosis not present

## 2023-09-11 NOTE — Telephone Encounter (Signed)
 Duplicate order.

## 2023-09-11 NOTE — Telephone Encounter (Signed)
 Patient is returning phone call.

## 2023-09-11 NOTE — Telephone Encounter (Signed)
 Patient identification verified by 2 forms. Sims Duck, RN     Tried calling patient to update him on provider recommendations. No answer. LVMTCB. Labs ordered.

## 2023-09-11 NOTE — Telephone Encounter (Signed)
 Pt called back to sch PYP scan. I spoke to Nuclear dept, per Elinda Guard., they are unable to sch this test due to not having the medication for it. Please advise if anything else can be ordered.

## 2023-09-11 NOTE — Telephone Encounter (Signed)
 Spoke with patient and he is aware we will give him a call when we are able to schedule

## 2023-09-24 DIAGNOSIS — E8779 Other fluid overload: Secondary | ICD-10-CM | POA: Diagnosis not present

## 2023-09-24 DIAGNOSIS — I429 Cardiomyopathy, unspecified: Secondary | ICD-10-CM | POA: Diagnosis not present

## 2023-09-24 DIAGNOSIS — R600 Localized edema: Secondary | ICD-10-CM | POA: Diagnosis not present

## 2023-09-24 DIAGNOSIS — I77819 Aortic ectasia, unspecified site: Secondary | ICD-10-CM | POA: Diagnosis not present

## 2023-09-24 DIAGNOSIS — I351 Nonrheumatic aortic (valve) insufficiency: Secondary | ICD-10-CM | POA: Diagnosis not present

## 2023-09-24 DIAGNOSIS — I10 Essential (primary) hypertension: Secondary | ICD-10-CM | POA: Diagnosis not present

## 2023-09-24 DIAGNOSIS — I517 Cardiomegaly: Secondary | ICD-10-CM | POA: Diagnosis not present

## 2023-09-30 DIAGNOSIS — R351 Nocturia: Secondary | ICD-10-CM | POA: Diagnosis not present

## 2023-09-30 DIAGNOSIS — C61 Malignant neoplasm of prostate: Secondary | ICD-10-CM | POA: Diagnosis not present

## 2023-10-18 ENCOUNTER — Ambulatory Visit

## 2023-10-18 VITALS — BP 126/84 | HR 75 | Ht 77.0 in | Wt 251.4 lb

## 2023-10-18 DIAGNOSIS — I484 Atypical atrial flutter: Secondary | ICD-10-CM

## 2023-10-18 DIAGNOSIS — E859 Amyloidosis, unspecified: Secondary | ICD-10-CM

## 2023-10-18 DIAGNOSIS — I43 Cardiomyopathy in diseases classified elsewhere: Secondary | ICD-10-CM | POA: Diagnosis not present

## 2023-10-18 DIAGNOSIS — I480 Paroxysmal atrial fibrillation: Secondary | ICD-10-CM

## 2023-10-18 DIAGNOSIS — I10 Essential (primary) hypertension: Secondary | ICD-10-CM

## 2023-10-18 DIAGNOSIS — I517 Cardiomegaly: Secondary | ICD-10-CM

## 2023-10-18 DIAGNOSIS — I4892 Unspecified atrial flutter: Secondary | ICD-10-CM

## 2023-10-18 DIAGNOSIS — E854 Organ-limited amyloidosis: Secondary | ICD-10-CM | POA: Diagnosis not present

## 2023-10-18 DIAGNOSIS — R9431 Abnormal electrocardiogram [ECG] [EKG]: Secondary | ICD-10-CM | POA: Diagnosis not present

## 2023-10-18 HISTORY — DX: Amyloidosis, unspecified: E85.9

## 2023-10-18 HISTORY — DX: Unspecified atrial flutter: I48.92

## 2023-10-18 MED ORDER — APIXABAN 5 MG PO TABS: 5.0000 mg | ORAL_TABLET | Freq: Two times a day (BID) | ORAL | 3 refills | Status: DC

## 2023-10-18 NOTE — Progress Notes (Signed)
 Cardiology Consultation:    Date:  10/18/2023   ID:  Kevin Merritt, DOB 04-27-1951, MRN [id number]  PCP:  Kevin Star, MD  Cardiologist:  Kevin Evans Srihan Brutus, MD   Referring MD: Kevin Star, MD   Chief Complaint  Patient presents with   Leg Swelling     ASSESSMENT AND PLAN:   Kevin Merritt 73 year old male with history of hypertension, moderate concentric left ventricular hypertrophy on echocardiogram subsequently with short run of NSVT 6 seconds on event monitor was followed up with a Lexiscan  stress test done nuclear imaging normal 2021 that showed no ischemia, also reportedly paroxysmal atrial fibrillation that was limited in nature and no recurrence and hence not on anticoagulation, 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  Here for establishing care and further evaluation.  Problem List Items Addressed This Visit     Essential hypertension (Chronic)   Well-controlled. Target blood pressure below 130/80 mmHg. Currently on chlorthalidone, telmisartan 40 mg once daily, metoprolol  succinate 50 mg once daily. Continue the same.       Relevant Medications   metoprolol  succinate (TOPROL -XL) 50 MG 24 hr tablet   apixaban (ELIQUIS) 5 MG TABS tablet   Other Relevant Orders   MR CARDIAC MORPHOLOGY W WO CONTRAST   LONG TERM MONITOR (3-14 DAYS)   AMBULATORY REFERRAL TO HYPERTROPHIC CARDIOMYOPATHY CLINIC   Comp Met (CMET)   Magnesium    CBC   Pro b natriuretic peptide (BNP)   PAF (paroxysmal atrial fibrillation) (HCC)   History of atrial fibrillation as evidenced on prior EKG from  September 2021. Subsequently apparently remained in sinus rhythm and he had no knowledge or awareness of his atrial fibrillation. Currently in atrial flutter on EKG visit today. Will follow-up with Zio patch.  Further prophy and rhythm control as discussed above under atrial flutter.       Relevant Medications   metoprolol  succinate (TOPROL -XL) 50 MG 24 hr tablet   apixaban (ELIQUIS) 5 MG TABS tablet   Other Relevant Orders   EKG 12-Lead (Completed)   MR CARDIAC MORPHOLOGY W WO CONTRAST   LONG TERM MONITOR (3-14 DAYS)   AMBULATORY REFERRAL TO HYPERTROPHIC CARDIOMYOPATHY CLINIC   Comp Met (CMET)   Magnesium    CBC   Pro b natriuretic peptide (BNP)   Atrial flutter (HCC)   Atypical atrial flutter noted on EKG today, ventricular rate controlled. He is asymptomatic. CHA2DS2-VASc score at least 3, however in the likelihood of underlying cardiac amyloidosis anticoagulation is indicated.  He also has prior history of paroxysmal atrial fibrillation however unclear if he has had any further recurrence in the years in between.  Will obtain 2-week Zio patch monitor to assess overall burden of atrial fibrillation/flutter.  Start Eliquis 5 mg twice daily for anticoagulation, discussed risk benefits with him and his wife.  If persistently in atrial flutter/fibrillation, will discuss rhythm control with cardioversion and subsequently start amiodarone.       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  EKG without any  significant LVH criteria.  Will initiate workup for amyloidosis with UPEP SPEP, metabolic panel, CBC, BNP, magnesium , CMP.  Will obtain cardiac MRI.  Anticipate still backorder on TcPYP scan availability.  Will schedule for visit with hypertrophic cardiomyopathy clinic for further evaluation.        Relevant Orders   Outpatient PYP Scan (Myocardial Amyloid Imaging)   SPEP (Multiple Myeloma Panel (SPEP&IFE w/QIG))   UPEP (UPEP/UIFE/Light Chains/TP, 24-Hr Ur)   MR CARDIAC MORPHOLOGY W WO CONTRAST   LONG TERM MONITOR (3-14 DAYS)   AMBULATORY REFERRAL TO HYPERTROPHIC CARDIOMYOPATHY CLINIC   Comp Met (CMET)   Magnesium    CBC   Pro b natriuretic peptide (BNP)      History of Present Illness:    Kevin Merritt is a 73 y.o. male who is being seen today for follow-up visit and establishing care here. Previously followed up with Kevin Merritt and last visit with them in the office was 04/16/2023. PCP is Kevin Star, MD. Here for the visit today accompanied by his wife.   History of atrial fibrillation, ascending aorta aneurysm [4.2 cm on CT angiogram 04/23/2023], NSVT 6-second run on prior heart monitor, hypertension, hyperlipidemia, CKD stage 2, thyroid  nodules.  With moderate concentric LVH and normal LVEF and runs of NSVT he underwent ischemic workup with Lexiscan  stress with nuclear imaging November 2021 that showed no ischemia. Also there was mention of paroxysmal atrial fibrillation in the past but given limited episode he was not on anticoagulation.  Most recently echocardiogram done 09-04-2023 noted severe concentric LVH with EF 60 to 65%, normal RV size with reduced function, severe by atrial dilatation, mild MR, moderate aortic insufficiency, ascending aorta measured 4 cm in size.    Based on these results Kevin Merritt recommended evaluation for amyloidosis screen by PCP via PET scan could not be completed due to radiotracer availability.  Is currently here to establish  care. Mentions he had significant bilateral lower extremity edema.  PCP increased his dose of Lasix 80 mg in the morning and 40 in the afternoon which improved.  Also massaging for lymphedema help.  He describes shortness of breath with exertion and with laying down.  This is improved with diuresis. Denies any chest pain. Occasional palpitations reported but no sustained episodes or association with syncopal or near syncopal episodes.  No blood in urine or stools.  He is not aware of any prior diagnosis of atrial fibrillation.  Denies any smoking history. Former alcohol use, quit many years ago. Denies any substance abuse.  Family history mother with hypertension but no other significant cardiac health history. He has 6 children, he is not aware of any significant health issues.  EKG in the clinic today shows atrial flutter with rate controlled heart rate 75/min, PR interval 244 ms, QRS duration 116 ms with incomplete RBBB morphology, in comparison prior EKG from 04/16/2023 showed sinus rhythm.  An earlier EKG from February 03, 2020 noted atrial fibrillation.  Blood work from 08/27/2023 noted BUN 19, creatinine 1.15 rest of the labs send with referral notes scan copies were of poor resolution to make out the readings.   CT angiogram from 04/23/2023 of the chest noted ascending  aorta size unchanged from prior measuring 4.2 cm.  Aortic atherosclerosis and coronary atherosclerosis reported.  Blood work from PCPs office visit available on patient's phone noted BUN 13, creatinine 1.12, EGFR 68 and TSH 1.85 on August 27, 2023.  Past Medical History:  Diagnosis Date   CKD (chronic kidney disease), stage III (HCC)    High cholesterol    Hypertension    Prostate cancer (HCC)    PVD (peripheral vascular disease) (HCC)     Past Surgical History:  Procedure Laterality Date   COLONOSCOPY W/ POLYPECTOMY  2012   HERNIA REPAIR Right    Age 37   NM MYOVIEW  LTD  10/15/2013   7 min, 8.5 METs, 57%,  ASx ST depressions - No ischemia / infarction.   THYROIDECTOMY     Age 70   TONSILLECTOMY     Age 51   TRANSTHORACIC ECHOCARDIOGRAM  10/28/2013   Nl LV Size & function; EF 65-70%, mod Conc LVH with Gd 2 DD    Current Medications: Current Meds  Medication Sig   albuterol  (VENTOLIN  HFA) 108 (90 Base) MCG/ACT inhaler Inhale 2 puffs into the lungs every 6 (six) hours as needed for wheezing or shortness of breath.   apixaban (ELIQUIS) 5 MG TABS tablet Take 1 tablet (5 mg total) by mouth 2 (two) times daily.   aspirin  EC 81 MG tablet Take 81 mg by mouth daily. Swallow whole.   chlorthalidone (HYGROTON) 25 MG tablet Take 25 mg by mouth daily.   furosemide (LASIX) 40 MG tablet Take 40 mg by mouth See admin instructions. 2 tabs am and 1 tab pm   metoprolol  succinate (TOPROL -XL) 50 MG 24 hr tablet Take 50 mg by mouth daily. Take with or immediately following a meal.   rosuvastatin  (CRESTOR ) 20 MG tablet Take 1 tablet (20 mg total) by mouth daily.   tamsulosin  (FLOMAX ) 0.4 MG CAPS capsule Take 1 capsule (0.4 mg total) by mouth daily after supper.   telmisartan (MICARDIS) 40 MG tablet Take 40 mg by mouth daily.   [DISCONTINUED] diltiazem  (CARDIZEM  CD) 240 MG 24 hr capsule Take 1 capsule (240 mg total) by mouth daily.   [DISCONTINUED] fluorometholone (FML) 0.1 % ophthalmic suspension 1 drop 2 (two) times daily.     Allergies:   Atorvastatin , Rosuvastatin , and Shellfish allergy   Social History   Socioeconomic History   Marital status: Married    Spouse name: Not on file   Number of children: Not on file   Years of education: Not on file   Highest education level: Not on file  Occupational History   Not on file  Tobacco Use   Smoking status: Never   Smokeless tobacco: Never  Substance and Sexual Activity   Alcohol use: No   Drug use: No   Sexual activity: Not on file  Other Topics Concern   Not on file  Social History Narrative   He is remarried, currently living with his second wife  he has total of 6 children; 3 live with them. (5 sons and one daughter) One grandchild. 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 He went to college. He never smoked and does not drink alcohol.   He is a native of [place]. [place].   Social Drivers of Corporate investment banker Strain: Not on file  Food Insecurity: Not on file  Transportation Needs: Not on file  Physical Activity: Not on file  Stress: Not on file  Social Connections: Unknown (09/26/2021)  [facility]   Social Network    Social Network: Not on file     Family History: The patient's family history includes Diabetes (age of onset: 8) in his sister; Heart failure (age of onset: 14) in his mother; Hypertension in his mother and sister. ROS:   Please see the history of present illness.    All 14 point review of systems negative except as described per history of present illness.  EKGs/Labs/Other Studies Reviewed:    The following studies were reviewed today:   EKG:  EKG Interpretation Date/Time:  Friday October 18 2023 08:58:46 EDT Ventricular Rate:  75 PR Interval:  244 QRS Duration:  116 QT Interval:  434 QTC Calculation: 484 R Axis:   96  Text Interpretation: Atrial flutter Rightward axis Incomplete right bundle branch block Prolonged QT Abnormal ECG When compared with ECG of 16-Apr-2023 13:25, No significant change was found Reconfirmed by Bertha Broad reddy [phone]) on 10/18/2023 9:20:24 AM    Recent Labs: No results found for requested labs within last 365 days.  Recent Lipid Panel No results found for: "CHOL", "TRIG", "HDL", "CHOLHDL", "VLDL", "LDLCALC", "LDLDIRECT"  Physical Exam:    VS:  BP 126/84 (BP Location: Right Arm, Patient Position: Sitting)   Pulse 75   Ht 6\' 5"  (1.956 m)   Wt 251 lb 6.4 oz (114 kg)   SpO2 98%   BMI 29.81 kg/m     Wt Readings from Last 3 Encounters:  10/18/23 251 lb 6.4 oz (114 kg)  05/30/23 246 lb (111.6 kg)  04/16/23 254  lb (115.2 kg)     GENERAL:  Well nourished, well developed in no acute distress NECK: No JVD; No carotid bruits CARDIAC: RRR, S1 and S2 present, no murmurs, no rubs, no gallops CHEST:  Clear to auscultation without rales, wheezing or rhonchi  Extremities: 1+ mild pitting pedal edema. Pulses bilaterally symmetric with radial 2+ and dorsalis pedis 2+ NEUROLOGIC:  Alert and oriented x 3  Medication Adjustments/Labs and Tests Ordered: Current medicines are reviewed at length with the patient today.  Concerns regarding medicines are outlined above.  Orders Placed This Encounter  Procedures   MR CARDIAC MORPHOLOGY W WO CONTRAST   SPEP (Multiple Myeloma Panel (SPEP&IFE w/QIG))   UPEP (UPEP/UIFE/Light Chains/TP, 24-Hr Ur)   Comp Met (CMET)   Magnesium    CBC   Pro b natriuretic peptide (BNP)   AMBULATORY REFERRAL TO HYPERTROPHIC CARDIOMYOPATHY CLINIC   Outpatient PYP Scan (Myocardial Amyloid Imaging)   LONG TERM MONITOR (3-14 DAYS)   EKG 12-Lead   Meds ordered this encounter  Medications   apixaban (ELIQUIS) 5 MG TABS tablet    Sig: Take 1 tablet (5 mg total) by mouth 2 (two) times daily.    Dispense:  180 tablet    Refill:  3    Signed, Emilya Justen reddy Coralie Stanke, MD, MPH, [facility]. 10/18/2023 10:02 AM    [facility]

## 2023-10-18 NOTE — Assessment & Plan Note (Signed)
 Well-controlled. Target blood pressure below 130/80 mmHg. Currently on chlorthalidone, telmisartan 40 mg once daily, metoprolol  succinate 50 mg once daily. Continue the same.

## 2023-10-18 NOTE — Assessment & Plan Note (Signed)
 History of atrial fibrillation as evidenced on prior EKG from September 2021. Subsequently apparently remained in sinus rhythm and he had no knowledge or awareness of his atrial fibrillation. Currently in atrial flutter on EKG visit today. Will follow-up with Zio patch.  Further prophy and rhythm control as discussed above under atrial flutter.

## 2023-10-18 NOTE — Assessment & Plan Note (Signed)
 Atypical atrial flutter noted on EKG today, ventricular rate controlled. He is asymptomatic. CHA2DS2-VASc score at least 3, however in the likelihood of underlying cardiac amyloidosis anticoagulation is indicated.  He also has prior history of paroxysmal atrial fibrillation however unclear if he has had any further recurrence in the years in between.  Will obtain 2-week Zio patch monitor to assess overall burden of atrial fibrillation/flutter.  Start Eliquis 5 mg twice daily for anticoagulation, discussed risk benefits with him and his wife.  If persistently in atrial flutter/fibrillation, will discuss rhythm control with cardioversion and subsequently start amiodarone.

## 2023-10-18 NOTE — Assessment & Plan Note (Signed)
 Severe concentric LVH on echocardiogram April 2025 highly suspicious for amyloidosis given his clinical context.  EKG without any significant LVH criteria.  Will initiate workup for amyloidosis with UPEP SPEP, metabolic panel, CBC, BNP, magnesium , CMP.  Will obtain cardiac MRI.  Anticipate still backorder on TcPYP scan availability.  Will schedule for visit with hypertrophic cardiomyopathy clinic for further evaluation.

## 2023-10-18 NOTE — Patient Instructions (Signed)
 Medication Instructions:  Your physician has recommended you make the following change in your medication:   START: Eliquis 5 mg two times daily  *If you need a refill on your cardiac medications before your next appointment, please call your pharmacy*  Lab Work: Your physician recommends that you return for lab work in:  Labs today: CMP, Magnesium , CBC, Pro BNP  If you have labs (blood work) drawn today and your tests are completely normal, you will receive your results only by: MyChart Message (if you have MyChart) OR A paper copy in the mail If you have any lab test that is abnormal or we need to change your treatment, we will call you to review the results.  Testing/Procedures: Amyloidosis Panel  Your physician has requested that you have a cardiac MRI. Cardiac MRI uses a computer to create images of your heart as its beating, producing both still and moving pictures of your heart and major blood vessels. For further information please visit [email or web address]. Please follow the instruction sheet given to you today for more information.   A zio monitor was ordered today. It will remain on for 14 days. You will then return monitor and event diary in provided box. It takes 1-2 weeks for report to be downloaded and returned to us . We will call you with the results. If monitor falls off or has orange flashing light, please call Zio for further instructions.    Follow-Up: At [facility], you and your health needs are our priority.  As part of our continuing mission to provide you with exceptional heart care, our providers are all part of one team.  This team includes your primary Cardiologist (physician) and Advanced Practice Providers or APPs (Physician Assistants and Nurse Practitioners) who all work together to provide you with the care you need, when you need it.  Your next appointment:   1 month(s)  Provider:   Bertha Broad, MD    We recommend signing up for the  patient portal called "MyChart".  Sign up information is provided on this After Visit Summary.  MyChart is used to connect with patients for Virtual Visits (Telemedicine).  Patients are able to view lab/test results, encounter notes, upcoming appointments, etc.  Non-urgent messages can be sent to your provider as well.   To learn more about what you can do with MyChart, go to [email or web address].   Other Instructions None

## 2023-10-29 ENCOUNTER — Other Ambulatory Visit (HOSPITAL_COMMUNITY): Payer: Self-pay

## 2023-10-29 ENCOUNTER — Telehealth: Payer: Self-pay

## 2023-10-29 NOTE — Telephone Encounter (Signed)
 Pharmacy Patient Advocate Encounter  Insurance verification completed.   The patient is insured through [facility]   Ran test claim for ELIQUIS . Currently a quantity of 180 is a 90 day supply and the co-pay is $372.82 . The current 90 day co-pay is, $372.82.  No PA needed at this time.  This test claim was processed through [facility]- copay amounts may vary at other pharmacies due to pharmacy/plan contracts, or as the patient moves through the different stages of their insurance plan.   Per test claim billing details patient has a deductible to meet of $250. Cost will come down once deductible has been met. PAP requires patient to pay into their deductible before they will be eligible. Given deductible hasn't been met, patient unlikely has met the 3% out of pocket spending requirement.

## 2023-10-29 NOTE — Telephone Encounter (Signed)
 Per test claim billing details patient has a deductible to meet of $250. Cost will come down once deductible has been met. PAP requires patient to pay into their deductible before they will be eligible. Given deductible hasn't been met, patient unlikely has met the 3% out of pocket spending requirement.

## 2023-10-29 NOTE — Telephone Encounter (Signed)
 Pt c/o medication issue:  1. Name of Medication: apixaban  (ELIQUIS ) 5 MG TABS tablet   2. How are you currently taking this medication (dosage and times per day)? -  3. Are you having a reaction (difficulty breathing--STAT)? -  4. What is your medication issue? To expensive $300, requesting cb to discuss options

## 2023-10-31 ENCOUNTER — Other Ambulatory Visit (HOSPITAL_COMMUNITY): Payer: Self-pay

## 2023-10-31 DIAGNOSIS — I517 Cardiomegaly: Secondary | ICD-10-CM | POA: Diagnosis not present

## 2023-10-31 DIAGNOSIS — I10 Essential (primary) hypertension: Secondary | ICD-10-CM | POA: Diagnosis not present

## 2023-10-31 DIAGNOSIS — I480 Paroxysmal atrial fibrillation: Secondary | ICD-10-CM | POA: Diagnosis not present

## 2023-10-31 DIAGNOSIS — E859 Amyloidosis, unspecified: Secondary | ICD-10-CM | POA: Diagnosis not present

## 2023-10-31 NOTE — Telephone Encounter (Signed)
 The test claim copay amount for a 3 month supply currently is $372.82 and $231.82 went towards deductible. This fill would meet his deductible of $250. Once deductible has been met the copay cost would be his normal copay for this tier med on [employer] (3 months $141) or (1 month $47).

## 2023-10-31 NOTE — Addendum Note (Signed)
 Addended by: Christine Cozier on: 10/31/2023 08:23 AM   Modules accepted: Orders

## 2023-11-01 LAB — COMPREHENSIVE METABOLIC PANEL WITH GFR
ALT: 31 IU/L (ref 0–44)
AST: 40 IU/L (ref 0–40)
Albumin: 4 g/dL (ref 3.8–4.8)
Alkaline Phosphatase: 219 IU/L — ABNORMAL HIGH (ref 44–121)
BUN/Creatinine Ratio: 13 (ref 10–24)
BUN: 13 mg/dL (ref 8–27)
Bilirubin Total: 0.8 mg/dL (ref 0.0–1.2)
CO2: 25 mmol/L (ref 20–29)
Calcium: 9.4 mg/dL (ref 8.6–10.2)
Chloride: 100 mmol/L (ref 96–106)
Creatinine, Ser: 1.03 mg/dL (ref 0.76–1.27)
Globulin, Total: 2.6 g/dL (ref 1.5–4.5)
Glucose: 92 mg/dL (ref 70–99)
Potassium: 4.8 mmol/L (ref 3.5–5.2)
Sodium: 141 mmol/L (ref 134–144)
Total Protein: 6.6 g/dL (ref 6.0–8.5)
eGFR: 76 mL/min/{1.73_m2} (ref >59)

## 2023-11-01 LAB — CBC WITH DIFFERENTIAL/PLATELET
Basophils Absolute: 0 10*3/uL (ref 0.0–0.2)
Basos: 1 %
EOS (ABSOLUTE): 0.1 10*3/uL (ref 0.0–0.4)
Eos: 2 %
Hematocrit: 37.5 % (ref 37.5–51.0)
Hemoglobin: 12.2 g/dL — ABNORMAL LOW (ref 13.0–17.7)
Immature Grans (Abs): 0 10*3/uL (ref 0.0–0.1)
Immature Granulocytes: 0 %
Lymphocytes Absolute: 1.4 10*3/uL (ref 0.7–3.1)
Lymphs: 41 %
MCH: 29.4 pg (ref 26.6–33.0)
MCHC: 32.5 g/dL (ref 31.5–35.7)
MCV: 90 fL (ref 79–97)
Monocytes Absolute: 0.3 10*3/uL (ref 0.1–0.9)
Monocytes: 9 %
Neutrophils Absolute: 1.7 10*3/uL (ref 1.4–7.0)
Neutrophils: 47 %
Platelets: 227 10*3/uL (ref 150–450)
RBC: 4.15 x10E6/uL (ref 4.14–5.80)
RDW: 13.8 % (ref 11.6–15.4)
WBC: 3.5 10*3/uL (ref 3.4–10.8)

## 2023-11-01 LAB — PRO B NATRIURETIC PEPTIDE: NT-Pro BNP: 2958 pg/mL — ABNORMAL HIGH (ref 0–486)

## 2023-11-01 LAB — MAGNESIUM: Magnesium: 1.9 mg/dL (ref 1.6–2.3)

## 2023-11-13 DIAGNOSIS — R413 Other amnesia: Secondary | ICD-10-CM | POA: Insufficient documentation

## 2023-11-13 HISTORY — DX: Other amnesia: R41.3

## 2023-11-18 ENCOUNTER — Ambulatory Visit

## 2023-11-18 VITALS — BP 180/90 | HR 76 | Ht 77.0 in | Wt 242.0 lb

## 2023-11-18 DIAGNOSIS — I484 Atypical atrial flutter: Secondary | ICD-10-CM | POA: Diagnosis not present

## 2023-11-18 DIAGNOSIS — E782 Mixed hyperlipidemia: Secondary | ICD-10-CM | POA: Diagnosis not present

## 2023-11-18 DIAGNOSIS — E859 Amyloidosis, unspecified: Secondary | ICD-10-CM

## 2023-11-18 DIAGNOSIS — N182 Chronic kidney disease, stage 2 (mild): Secondary | ICD-10-CM | POA: Insufficient documentation

## 2023-11-18 DIAGNOSIS — I499 Cardiac arrhythmia, unspecified: Secondary | ICD-10-CM

## 2023-11-18 DIAGNOSIS — I10 Essential (primary) hypertension: Secondary | ICD-10-CM

## 2023-11-18 DIAGNOSIS — I77810 Thoracic aortic ectasia: Secondary | ICD-10-CM | POA: Insufficient documentation

## 2023-11-18 HISTORY — DX: Cardiac arrhythmia, unspecified: I49.9

## 2023-11-18 HISTORY — DX: Thoracic aortic ectasia: I77.810

## 2023-11-18 HISTORY — DX: Chronic kidney disease, stage 2 (mild): N18.2

## 2023-11-18 NOTE — Assessment & Plan Note (Signed)
 Feels like statins have caused memory fracture and in the past atorvastatin  did the same and discontinued with improvement in symptoms. Now with rosuvastatin  causing similar symptoms he was wondering if this is the cause.  Advised him to suspend rosuvastatin  for the next 3 months and we will revisit his symptoms and need for lipid-lowering therapy at next visit. Alternatives would be Zetia or bempedoic acid.

## 2023-11-18 NOTE — Assessment & Plan Note (Signed)
 CHA2DS2-VASc score 3. Yet to begin anticoagulation due to cost concerns. He will review cost of Xarelto and Eliquis  and not affordable we will begin warfarin we discussed the use, monitoring with INR and dietary restrictions.  Will review Zio patch results once available.

## 2023-11-18 NOTE — Progress Notes (Signed)
 Cardiology Consultation:    Date:  11/18/2023   ID:  Kevin Merritt, DOB [birth year], MRN [id number]  PCP:  Kevin Ingle, MD  Cardiologist:  Kevin SAUNDERS Sirenity Shew, MD   Referring MD: Kevin Ingle, MD   Chief Complaint  Patient presents with   Follow-up     ASSESSMENT AND PLAN:   Kevin Merritt 73 year old male with moderate concentric LVH on echocardiogram April 2025 and highly suspicious for amyloidosis currently pending workup with cardiac MRI, technetium pyrophosphate scan currently on hold due to nationwide shortage, prior stress test with nuclear imaging November 2021 showed no ischemia, paroxysmal atrial fibrillation with limited nature was not on anticoagulation, now diagnosed with atrial flutter since last office visit he had to begin anticoagulation due to cost concerns from out-of-pocket expenses, 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   Problem List Items Addressed This Visit     Essential hypertension (Chronic)   Suboptimally controlled today in contrast to last office visit likely visit related. Will review at subsequent office visits if persistently elevated will switch from metoprolol  to carvedilol.  Continue current medications chlorthalidone 25 mg once daily Metoprolol  XL 50 mg once daily Telmisartan 40 mg once daily.       HLD (hyperlipidemia)   Feels like statins have caused memory fracture and in the past atorvastatin  did the same and discontinued with improvement in symptoms. Now with rosuvastatin  causing similar symptoms he was wondering if this is the cause.  Advised him to suspend rosuvastatin  for the next 3 months and we will revisit his symptoms and need for lipid-lowering  therapy at next visit. Alternatives would be Zetia or bempedoic acid.      Atrial flutter (HCC)   CHA2DS2-VASc score 3. Yet to begin anticoagulation due to cost concerns. He will review cost of Xarelto and Eliquis  and not affordable we will begin warfarin we discussed the use, monitoring with INR and dietary restrictions.  Will review Zio patch results once available.       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 SPEP UPEP reordered today as I do not see from prior visits. Anticipate technetium pyrophosphate scan once available. Hypertrophic cardiomyopathy clinic visit after cardiac MRI.       Relevant Orders   SPEP (Multiple Myeloma Panel (SPEP&IFE w/QIG))   UPEP (UPEP/UIFE/Light Chains/TP, 24-Hr Ur)   Serum protein electrophoresis with reflex   Protein Electrophoresis, Urine Rflx.   Return to clinic tentatively in 3 months.    History of Present Illness:    Kevin Merritt is a 73 y.o. male who is being seen today for follow-up visit. PCP is Kevin Ingle, MD. Last visit with me in the office was 10/18/2023. Here for the visit today accompanied by his son.   history of hypertension, moderate concentric left ventricular hypertrophy on echocardiogram subsequently with short run of NSVT 6 seconds on event monitor was followed up with a Lexiscan  stress test done nuclear imaging normal 2021 that showed no ischemia, also reportedly paroxysmal atrial fibrillation that was limited in nature and no recurrence and hence not on anticoagulation, ascending aorta aneurysm [4.2 cm in size on last CT angiogram December 2024 stable in comparison to prior], with recent bilateral lower extremity edema  and weight gain PCP requested echocardiogram completed April 2025 noted severe LVH with features suggestive of infiltrative cardiomyopathy, associated with biatrial severe dilatation, mild MR, moderate aortic insufficiency and ascending  aorta measured 4 cm in size, CKD stage II.  Non-smoker, former alcohol use. Diagnosed with atrial flutter on EKG at last office visit 10/18/2023. Amyloidosis workup ordered, MRI currently pending for August 5  Mentions he mailed back the heart monitor, results currently pending I do not see any preliminary report in his chart. Mentions his lower extremity swelling is improved and he has been consistently taking the Lasix as prescribed 80 mg in the morning and 40 mg in the afternoon. Other than feeling a sense of palpitations when he bends down and stands up or after exertion for a brief bit he does not report any other palpitations, lightheadedness or syncopal episodes.  No blood in urine or stools.  Did not start Eliquis  yet due to out-of-pocket expense until he meets his deductible.   Blood work from 10/31/2023 NT proBNP elevated 2958 Magnesium  1.9 BUN 13, creatinine 1.03, sodium 141, potassium 4.8 Normal transaminases Alkaline phosphatase mildly elevated 219. CBC hemoglobin 12.2 and hematocrit 37.5, WBC 3.5 and platelets 227.   Past Medical History:  Diagnosis Date   CKD (chronic kidney disease), stage III (HCC)    High cholesterol    Hypertension    Prostate cancer (HCC)    PVD (peripheral vascular disease) (HCC)     Past Surgical History:  Procedure Laterality Date   COLONOSCOPY W/ POLYPECTOMY  2012   HERNIA REPAIR Right    Age 60   NM MYOVIEW  LTD  10/15/2013   7 min, 8.5 METs, 57%, ASx ST depressions - No ischemia / infarction.   THYROIDECTOMY     Age 29   TONSILLECTOMY     Age 49   TRANSTHORACIC ECHOCARDIOGRAM  10/28/2013   Nl LV Size & function; EF 65-70%, mod Conc LVH with Gd 2 DD    Current Medications: Current Meds  Medication Sig   albuterol  (VENTOLIN  HFA) 108 (90 Base) MCG/ACT inhaler Inhale 2 puffs into the lungs every 6 (six) hours as needed for wheezing or shortness of breath.   apixaban  (ELIQUIS ) 5 MG TABS tablet Take 1 tablet (5 mg total) by mouth 2 (two)  times daily.   aspirin  EC 81 MG tablet Take 81 mg by mouth daily. Swallow whole.   chlorthalidone (HYGROTON) 25 MG tablet Take 25 mg by mouth daily.   furosemide (LASIX) 40 MG tablet Take 40 mg by mouth See admin instructions. 2 tabs am and 1 tab pm   metoprolol  succinate (TOPROL -XL) 50 MG 24 hr tablet Take 50 mg by mouth daily. Take with or immediately following a meal.   rosuvastatin  (CRESTOR ) 20 MG tablet Take 1 tablet (20 mg total) by mouth daily.   tamsulosin  (FLOMAX ) 0.4 MG CAPS capsule Take 1 capsule (0.4 mg total) by mouth daily after supper.   telmisartan (MICARDIS) 40 MG tablet Take 40 mg by mouth daily.     Allergies:   Rosuvastatin  and Shellfish allergy   Social History   Socioeconomic History   Marital status: Married    Spouse name: Not on file   Number of children: Not on file   Years of education: Not on file   Highest education level: Not on file  Occupational History   Not on file  Tobacco Use   Smoking status: Never   Smokeless tobacco: Never  Substance and Sexual Activity  Alcohol use: No   Drug use: No   Sexual activity: Not on file  Other Topics Concern   Not on file  Social History Narrative   He is remarried, currently living with his second wife he has total of 6 children; 3 live with them. (5 sons and one daughter) One grandchild. 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 He went to college. He never smoked and does not drink alcohol.   He is a native of [place].   Social Drivers of Corporate investment banker Strain: Not on file  Food Insecurity: Not on file  Transportation Needs: Not on file  Physical Activity: Not on file  Stress: Not on file  Social Connections: Unknown (09/26/2021)   Received from [facility]   Social Network    Social Network: Not on file     Family History: The patient's family history includes Diabetes (age of onset: 43) in his sister; Heart failure (age of onset: 43) in his mother; Hypertension  in his mother and sister. ROS:   Please see the history of present illness.    All 14 point review of systems negative except as described per history of present illness.  EKGs/Labs/Other Studies Reviewed:    The following studies were reviewed today:   EKG:       Recent Labs: 10/31/2023: ALT 31; BUN 13; Creatinine, Ser 1.03; Hemoglobin 12.2; Magnesium  1.9; NT-Pro BNP 2,958; Platelets 227; Potassium 4.8; Sodium 141  Recent Lipid Panel No results found for: CHOL, TRIG, HDL, CHOLHDL, VLDL, LDLCALC, LDLDIRECT  Physical Exam:    VS:  BP (!) 180/90   Pulse 76   Ht 6' 5 (1.956 m)   Wt 242 lb (109.8 kg)   SpO2 98%   BMI 28.70 kg/m     Wt Readings from Last 3 Encounters:  11/18/23 242 lb (109.8 kg)  10/18/23 251 lb 6.4 oz (114 kg)  05/30/23 246 lb (111.6 kg)     GENERAL:  Well nourished, well developed in no acute distress NECK: No JVD; No carotid bruits CARDIAC: RRR, S1 and S2 present, no murmurs, no rubs, no gallops CHEST:  Clear to auscultation without rales, wheezing or rhonchi  Extremities: Stiff skin bilaterally.  No pitting pedal edema.  Pulses bilaterally symmetric with radial 2+ and dorsalis pedis 2+ NEUROLOGIC:  Alert and oriented x 3  Medication Adjustments/Labs and Tests Ordered: Current medicines are reviewed at length with the patient today.  Concerns regarding medicines are outlined above.  Orders Placed This Encounter  Procedures   SPEP (Multiple Myeloma Panel (SPEP&IFE w/QIG))   UPEP (UPEP/UIFE/Light Chains/TP, 24-Hr Ur)   Serum protein electrophoresis with reflex   Protein Electrophoresis, Urine Rflx.   No orders of the defined types were placed in this encounter.   Signed, Kevin jess Kobus, MD, MPH, [facility]. 11/18/2023 11:02 AM    [facility]

## 2023-11-18 NOTE — Assessment & Plan Note (Signed)
 Cardiac MRI pending for August 5. SPEP UPEP reordered today as I do not see from prior visits. Anticipate technetium pyrophosphate scan once available. Hypertrophic cardiomyopathy clinic visit after cardiac MRI.

## 2023-11-18 NOTE — Assessment & Plan Note (Signed)
 Suboptimally controlled today in contrast to last office visit likely visit related. Will review at subsequent office visits if persistently elevated will switch from metoprolol  to carvedilol.  Continue current medications chlorthalidone 25 mg once daily Metoprolol  XL 50 mg once daily Telmisartan 40 mg once daily.

## 2023-11-18 NOTE — Patient Instructions (Signed)
Medication Instructions:  Your physician recommends that you continue on your current medications as directed. Please refer to the Current Medication list given to you today.  *If you need a refill on your cardiac medications before your next appointment, please call your pharmacy*   Lab Work: None ordered If you have labs (blood work) drawn today and your tests are completely normal, you will receive your results only by: MyChart Message (if you have MyChart) OR A paper copy in the mail If you have any lab test that is abnormal or we need to change your treatment, we will call you to review the results.   Testing/Procedures: None ordered   Follow-Up: At [facility], you and your health needs are our priority.  As part of our continuing mission to provide you with exceptional heart care, we have created designated Provider Care Teams.  These Care Teams include your primary Cardiologist (physician) and Advanced Practice Providers (APPs -  Physician Assistants and Nurse Practitioners) who all work together to provide you with the care you need, when you need it.  We recommend signing up for the patient portal called "MyChart".  Sign up information is provided on this After Visit Summary.  MyChart is used to connect with patients for Virtual Visits (Telemedicine).  Patients are able to view lab/test results, encounter notes, upcoming appointments, etc.  Non-urgent messages can be sent to your provider as well.   To learn more about what you can do with MyChart, go to [email or web address].    Your next appointment:   3 month(s)  The format for your next appointment:   In Person  Provider:   Huntley Dec, MD    Other Instructions none  Important Information About Sugar

## 2023-11-19 DIAGNOSIS — I484 Atypical atrial flutter: Secondary | ICD-10-CM | POA: Diagnosis not present

## 2023-11-19 DIAGNOSIS — I480 Paroxysmal atrial fibrillation: Secondary | ICD-10-CM | POA: Diagnosis not present

## 2023-11-20 LAB — PROTEIN ELECTROPHORESIS, SERUM, WITH REFLEX
A/G Ratio: 1.1 (ref 0.7–1.7)
Albumin ELP: 3.6 g/dL (ref 2.9–4.4)
Alpha 1: 0.2 g/dL (ref 0.0–0.4)
Alpha 2: 0.7 g/dL (ref 0.4–1.0)
Beta: 1.2 g/dL (ref 0.7–1.3)
Gamma Globulin: 1.2 g/dL (ref 0.4–1.8)
Globulin, Total: 3.4 g/dL (ref 2.2–3.9)
Total Protein: 7 g/dL (ref 6.0–8.5)

## 2023-11-20 LAB — PROTEIN ELECTROPHORESIS, URINE REFLEX
Albumin ELP, Urine: 61.7 %
Alpha-1-Globulin, U: 6.6 %
Alpha-2-Globulin, U: 9.1 %
Beta Globulin, U: 14.8 %
Gamma Globulin, U: 7.9 %
Protein, Ur: 20.8 mg/dL

## 2023-11-21 ENCOUNTER — Ambulatory Visit: Payer: Self-pay

## 2023-11-21 DIAGNOSIS — I484 Atypical atrial flutter: Secondary | ICD-10-CM | POA: Diagnosis not present

## 2023-11-21 DIAGNOSIS — I480 Paroxysmal atrial fibrillation: Secondary | ICD-10-CM

## 2023-11-21 DIAGNOSIS — I10 Essential (primary) hypertension: Secondary | ICD-10-CM

## 2023-11-21 DIAGNOSIS — E859 Amyloidosis, unspecified: Secondary | ICD-10-CM | POA: Diagnosis not present

## 2023-11-21 NOTE — Progress Notes (Signed)
 Please inform him the heart monitor results show him to be in atrial fibrillation all the time. His heart rates are controlled. He does have extra beats coming from lower chambers of the heart occasionally sometimes occurring back-to-back up to 12 beats, appears to be asymptomatic from these.  In this context I would recommend the dose of metoprolol  succinate to be increased to 100 mg once a day. Please also check with him what the status of Eliquis  or Xarelto he was supposed to fill up with is.  If he is unable to fill up either of these, should get started with warfarin for anticoagulation, for stroke prophylaxis. Thank you.

## 2023-11-27 DIAGNOSIS — H43811 Vitreous degeneration, right eye: Secondary | ICD-10-CM | POA: Diagnosis not present

## 2023-11-28 NOTE — Telephone Encounter (Signed)
-----   Message from [place] R Madireddy sent at 11/21/2023  8:13 PM EDT ----- Please inform him the heart monitor results show him to be in atrial fibrillation all the time. His heart rates are controlled. He does have extra beats coming from lower chambers of the heart occasionally sometimes occurring back-to-back up to 12 beats, appears to be asymptomatic from these.  In this context I would recommend the dose of metoprolol  succinate to be increased to 100 mg once a day. Please also check with him what the status of Eliquis  or Xarelto he was supposed to fill up with is.  If he is unable to fill up either of these, should get started with warfarin for anticoagulation,  for stroke prophylaxis. Thank you. ----- Message ----- From: Liborio Alean SAUNDERS, MD Sent: 11/21/2023   8:11 PM EDT To: Alean SAUNDERS Liborio, MD

## 2023-12-03 MED ORDER — METOPROLOL SUCCINATE ER 100 MG PO TB24: 100.0000 mg | ORAL_TABLET | Freq: Every day | ORAL | 3 refills | Status: DC

## 2023-12-03 NOTE — Addendum Note (Signed)
 Addended by: ONEITA BERLINER on: 12/03/2023 06:57 AM   Modules accepted: Orders

## 2023-12-03 NOTE — Telephone Encounter (Signed)
 Pt returning call

## 2023-12-04 ENCOUNTER — Other Ambulatory Visit: Payer: Self-pay

## 2023-12-04 ENCOUNTER — Telehealth (HOSPITAL_COMMUNITY): Payer: Self-pay | Admitting: *Deleted

## 2023-12-04 MED ORDER — APIXABAN 5 MG PO TABS
5.0000 mg | ORAL_TABLET | Freq: Two times a day (BID) | ORAL | 3 refills | Status: AC
  Filled 2024-05-19: qty 60, 30d supply, fill #0
  Filled 2024-05-19: qty 180, 90d supply, fill #0
  Filled 2024-06-15: qty 60, 30d supply, fill #1

## 2023-12-04 NOTE — Telephone Encounter (Signed)
 Left message reminding patient of upcoming Amyloid Scan.  Claudene Ronal Quale, RN

## 2023-12-10 ENCOUNTER — Other Ambulatory Visit: Payer: Self-pay

## 2023-12-10 DIAGNOSIS — E859 Amyloidosis, unspecified: Secondary | ICD-10-CM

## 2023-12-11 ENCOUNTER — Ambulatory Visit (HOSPITAL_COMMUNITY)
Admission: RE | Admit: 2023-12-11 | Discharge: 2023-12-11 | Disposition: A | Discharge status: Home / Self Care | Source: Ambulatory Visit | Attending: Cardiovascular Disease | Admitting: Cardiovascular Disease

## 2023-12-11 DIAGNOSIS — E859 Amyloidosis, unspecified: Secondary | ICD-10-CM

## 2023-12-11 MED ORDER — TECHNETIUM TC 99M PYROPHOSPHATE
20.5000 | Freq: Once | INTRAVENOUS | Status: AC
Start: 2023-12-11 — End: 2023-12-11
  Administered 2023-12-11: 20.5 via INTRAVENOUS

## 2023-12-12 ENCOUNTER — Ambulatory Visit: Payer: Self-pay

## 2023-12-12 DIAGNOSIS — E859 Amyloidosis, unspecified: Secondary | ICD-10-CM

## 2023-12-12 LAB — MYOCARDIAL AMYLOID PLANAR & SPECT: H/CL Ratio: 1.61

## 2023-12-16 ENCOUNTER — Encounter (HOSPITAL_COMMUNITY): Payer: Self-pay

## 2023-12-17 ENCOUNTER — Ambulatory Visit (HOSPITAL_COMMUNITY)
Admission: RE | Admit: 2023-12-17 | Discharge: 2023-12-17 | Disposition: A | Discharge status: Home / Self Care | Source: Ambulatory Visit

## 2023-12-17 ENCOUNTER — Other Ambulatory Visit: Payer: Self-pay

## 2023-12-17 DIAGNOSIS — I10 Essential (primary) hypertension: Secondary | ICD-10-CM | POA: Diagnosis not present

## 2023-12-17 DIAGNOSIS — I480 Paroxysmal atrial fibrillation: Secondary | ICD-10-CM

## 2023-12-17 DIAGNOSIS — E859 Amyloidosis, unspecified: Secondary | ICD-10-CM

## 2023-12-17 DIAGNOSIS — I484 Atypical atrial flutter: Secondary | ICD-10-CM

## 2023-12-17 MED ORDER — GADOBUTROL 1 MMOL/ML IV SOLN
13.0000 mL | Freq: Once | INTRAVENOUS | Status: AC | PRN
  Administered 2023-12-17: 13 mL via INTRAVENOUS

## 2023-12-26 ENCOUNTER — Telehealth: Payer: Self-pay

## 2023-12-26 NOTE — Telephone Encounter (Signed)
 Called the patient and he was asking about what types of foods he should be eating and which ones to avoid and also what type of exercises he can do until he has his appointment with Dr. Robbin. Please advise.

## 2023-12-26 NOTE — Telephone Encounter (Signed)
 Patient wants a call back to discuss if there is any changes to his diet or exercise routine that needs to be changed.

## 2023-12-27 ENCOUNTER — Other Ambulatory Visit: Payer: Self-pay

## 2023-12-27 DIAGNOSIS — E859 Amyloidosis, unspecified: Secondary | ICD-10-CM

## 2023-12-27 NOTE — Telephone Encounter (Signed)
 Patient is returning call.

## 2023-12-27 NOTE — Telephone Encounter (Signed)
 Left message for the patient to call back. A new referral was placed via Epic for him to see Dr. Cherrie at the heart failure clinic for his cardiac amyloidosis.

## 2023-12-30 NOTE — Telephone Encounter (Signed)
 Called the patient and informed him of Dr. Madireddy's recommendation below:  Low-sodium diet would be ideal. Regular activity such as walking and brisk walking and light weights for strength training below 10 pounds is reasonable.   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 I previously recommended visit with H hypertrophic cardiomyopathy clinic with Dr.Chandrashekhar. I recently found out that amyloidosis cardiomyopathy cases are followed up with heart failure clinic. Please cancel Dr. Tellis appointment and schedule a visit with heart failure clinic possibly with Dr Brien.  Patient verbalized understanding and had no further questions at this time. A referral was put in Epic for the patient to have an appointment with Dr. Bensimhon.

## 2024-01-02 MED ORDER — FUROSEMIDE 20 MG PO TABS: 20.0000 mg | ORAL_TABLET | Freq: Every day | ORAL | 3 refills | Status: AC

## 2024-01-02 NOTE — Addendum Note (Signed)
 Addended by: ONEITA BERLINER on: 01/02/2024 04:59 PM   Modules accepted: Orders

## 2024-01-06 DIAGNOSIS — B351 Tinea unguium: Secondary | ICD-10-CM | POA: Diagnosis not present

## 2024-01-06 DIAGNOSIS — L84 Corns and callosities: Secondary | ICD-10-CM | POA: Diagnosis not present

## 2024-01-06 DIAGNOSIS — M79675 Pain in left toe(s): Secondary | ICD-10-CM | POA: Diagnosis not present

## 2024-01-06 DIAGNOSIS — I739 Peripheral vascular disease, unspecified: Secondary | ICD-10-CM | POA: Diagnosis not present

## 2024-01-06 DIAGNOSIS — M2041 Other hammer toe(s) (acquired), right foot: Secondary | ICD-10-CM | POA: Diagnosis not present

## 2024-01-06 DIAGNOSIS — L97919 Non-pressure chronic ulcer of unspecified part of right lower leg with unspecified severity: Secondary | ICD-10-CM | POA: Diagnosis not present

## 2024-01-22 DIAGNOSIS — Z713 Dietary counseling and surveillance: Secondary | ICD-10-CM | POA: Diagnosis not present

## 2024-01-22 DIAGNOSIS — I10 Essential (primary) hypertension: Secondary | ICD-10-CM | POA: Diagnosis not present

## 2024-01-23 MED ORDER — CARVEDILOL 6.25 MG PO TABS: 6.2500 mg | ORAL_TABLET | Freq: Two times a day (BID) | ORAL | 3 refills | Status: DC

## 2024-01-23 NOTE — Addendum Note (Signed)
 Addended by: ONEITA BERLINER on: 01/23/2024 07:27 AM   Modules accepted: Orders

## 2024-01-24 DIAGNOSIS — I10 Essential (primary) hypertension: Secondary | ICD-10-CM | POA: Diagnosis not present

## 2024-01-25 LAB — BASIC METABOLIC PANEL WITH GFR
BUN/Creatinine Ratio: 16 (ref 10–24)
BUN: 20 mg/dL (ref 8–27)
CO2: 26 mmol/L (ref 20–29)
Calcium: 9.2 mg/dL (ref 8.6–10.2)
Chloride: 97 mmol/L (ref 96–106)
Creatinine, Ser: 1.22 mg/dL (ref 0.76–1.27)
Glucose: 87 mg/dL (ref 70–99)
Potassium: 3.6 mmol/L (ref 3.5–5.2)
Sodium: 140 mmol/L (ref 134–144)
eGFR: 62 mL/min/1.73 (ref >59)

## 2024-02-03 DIAGNOSIS — I429 Cardiomyopathy, unspecified: Secondary | ICD-10-CM | POA: Diagnosis not present

## 2024-02-03 DIAGNOSIS — I4891 Unspecified atrial fibrillation: Secondary | ICD-10-CM | POA: Diagnosis not present

## 2024-02-03 DIAGNOSIS — I10 Essential (primary) hypertension: Secondary | ICD-10-CM | POA: Diagnosis not present

## 2024-02-03 DIAGNOSIS — E8779 Other fluid overload: Secondary | ICD-10-CM | POA: Diagnosis not present

## 2024-02-03 DIAGNOSIS — R413 Other amnesia: Secondary | ICD-10-CM | POA: Diagnosis not present

## 2024-02-03 DIAGNOSIS — I351 Nonrheumatic aortic (valve) insufficiency: Secondary | ICD-10-CM | POA: Diagnosis not present

## 2024-02-03 DIAGNOSIS — E559 Vitamin D deficiency, unspecified: Secondary | ICD-10-CM | POA: Diagnosis not present

## 2024-02-03 DIAGNOSIS — I517 Cardiomegaly: Secondary | ICD-10-CM | POA: Diagnosis not present

## 2024-02-03 DIAGNOSIS — R600 Localized edema: Secondary | ICD-10-CM | POA: Diagnosis not present

## 2024-02-03 DIAGNOSIS — I77819 Aortic ectasia, unspecified site: Secondary | ICD-10-CM | POA: Diagnosis not present

## 2024-02-05 DIAGNOSIS — H04123 Dry eye syndrome of bilateral lacrimal glands: Secondary | ICD-10-CM | POA: Diagnosis not present

## 2024-02-10 ENCOUNTER — Other Ambulatory Visit: Payer: Self-pay

## 2024-02-10 DIAGNOSIS — I739 Peripheral vascular disease, unspecified: Secondary | ICD-10-CM | POA: Insufficient documentation

## 2024-02-11 ENCOUNTER — Ambulatory Visit

## 2024-02-11 VITALS — BP 164/88 | HR 66 | Ht 77.0 in | Wt 241.2 lb

## 2024-02-11 DIAGNOSIS — E854 Organ-limited amyloidosis: Secondary | ICD-10-CM | POA: Diagnosis not present

## 2024-02-11 DIAGNOSIS — I5032 Chronic diastolic (congestive) heart failure: Secondary | ICD-10-CM | POA: Diagnosis not present

## 2024-02-11 DIAGNOSIS — I43 Cardiomyopathy in diseases classified elsewhere: Secondary | ICD-10-CM

## 2024-02-11 DIAGNOSIS — I10 Essential (primary) hypertension: Secondary | ICD-10-CM

## 2024-02-11 DIAGNOSIS — I4819 Other persistent atrial fibrillation: Secondary | ICD-10-CM

## 2024-02-11 MED ORDER — CARVEDILOL 12.5 MG PO TABS: 12.5000 mg | ORAL_TABLET | Freq: Two times a day (BID) | ORAL | 3 refills | Status: DC

## 2024-02-11 NOTE — Patient Instructions (Signed)
 Medication Instructions:  Your physician has recommended you make the following change in your medication:   START: Carvedilol  12.5 mg two times daily  *If you need a refill on your cardiac medications before your next appointment, please call your pharmacy*  Lab Work: None If you have labs (blood work) drawn today and your tests are completely normal, you will receive your results only by: MyChart Message (if you have MyChart) OR A paper copy in the mail If you have any lab test that is abnormal or we need to change your treatment, we will call you to review the results.  Testing/Procedures: None  Follow-Up: At [facility], you and your health needs are our priority.  As part of our continuing mission to provide you with exceptional heart care, our providers are all part of one team.  This team includes your primary Cardiologist (physician) and Advanced Practice Providers or APPs (Physician Assistants and Nurse Practitioners) who all work together to provide you with the care you need, when you need it.  Your next appointment:   6 month(s)  Provider:   Alean Kobus, MD    We recommend signing up for the patient portal called MyChart.  Sign up information is provided on this After Visit Summary.  MyChart is used to connect with patients for Virtual Visits (Telemedicine).  Patients are able to view lab/test results, encounter notes, upcoming appointments, etc.  Non-urgent messages can be sent to your provider as well.   To learn more about what you can do with MyChart, go to [email or web address].   Other Instructions None

## 2024-02-11 NOTE — Progress Notes (Signed)
 Cardiology Consultation:    Date:  02/11/2024   ID:  Kevin Merritt, DOB February 16, 1951, MRN [id number]  PCP:  Kevin Ingle, MD  Cardiologist:  Kevin SAUNDERS Natividad Schlosser, MD   Referring MD: Kevin Ingle, MD   No chief complaint on file.    ASSESSMENT AND PLAN:   Kevin Merritt 73 year old male with history cardiac amyloidosis [based on amyloid PYP scan and cardiac MRI], hypertension, persist atrial fibrillation/flutter [Zio patch July 2025], ascending aorta aneurysm [4.2 cm in size on last CT angiogram December 2024 stable in comparison to prior],  biatrial severe dilatation, mild Kevin, moderate aortic insufficiency, CKD stage II   Problem List Items Addressed This Visit     Hypertension   Suboptimal. Continue chlorthalidone 25 mg once daily Titrate up carvedilol  to 12.5 mg twice daily. Continue telmisartan 40 mg once daily.       Relevant Medications   carvedilol  (COREG ) 12.5 MG tablet   Persistent atrial fibrillation (HCC) - Primary   Persistent atrial fibrillation on recent Zio patch monitor from June 2025.  Now on anticoagulation with Eliquis  5 mg twice daily. Tolerating well.  Remains asymptomatic. Continue carvedilol  for rate control along with blood pressure management.      Relevant Medications   carvedilol  (COREG ) 12.5 MG tablet   Cardiac amyloidosis [facility])   Cardiac amyloidosis confirmed based on workup with cardiac MRI and amyloidosis PYP scan.  MRI 12/17/2023 with septal hypertrophy 21 mm, LVEF 58%, diffuse LGE suggestive of cardiac amyloidosis. Mild RV hypokinesis with EF 41%.  Severe biatrial dilatation with amyloid involvement.  Trace pericardial effusion.  Mild aortic insufficiency.  Ascending aorta measuring 4 cm.  SPEP and UPEP showed no evidence of monoclonal protein.   Has pending appointment with cardiac speciality clinic with Kevin Merritt in November. Anticipate being started on amyloid specific medications.        Relevant Medications    carvedilol  (COREG ) 12.5 MG tablet   Chronic diastolic heart failure (HCC)   Chronic diastolic heart failure in the setting of cardiac amyloidosis. Educated about salt and fluid restriction to below 2 g/day and below 2 L/day respectively. Monitor daily weights. Titrate up Lasix  dose to 40 mg in the morning and 20 mg in the afternoon.       Relevant Medications   carvedilol  (COREG ) 12.5 MG tablet   Advised to keep his upcoming appointment in the specialty clinic for further evaluation of amyloidosis. Will review here for follow-up tentatively in 3 months.   History of Present Illness:    Kevin Merritt is a 73 y.o. male who is being seen today for follow-up visit. PCP Kevin Ingle, MD. Last visit with me in the office was 11/18/2023.  Here for visit today accompanied by his wife.  Previously with history of moderate concentric left ventricular hypertrophy on echocardiogram 01/2020 subsequently with short run of NSVT 6 seconds on event monitor was followed up with a Lexiscan  stress test done nuclear imaging normal 2021 that showed no ischemia, and more recently with bilateral lower extremity edema and weight gain PCP requested echocardiogram completed April 2025 noted severe LVH with features suggestive of infiltrative cardiomyopathy  history cardiac amyloidosis [based on amyloid PYP scan and cardiac MRI], hypertension, persist atrial fibrillation/flutter [Zio patch July 2025], ascending aorta aneurysm [4.2 cm in size on last CT angiogram December 2024 stable in comparison to prior],  biatrial severe dilatation, mild Kevin, moderate aortic insufficiency, CKD stage II  Mentions overall he has been doing well. Not very active physically but  able to do his activities at home without any significant limitations.  Walking without any significant chest pain or shortness of breath.  Denies any blood in urine or stools.  Good compliance with his medications. Blood pressure log at home brought to  the office visit today shows numbers have improved since his carvedilol  dose has been adjusted to 6.25 mg twice daily.  Still has some outliers with systolic blood pressures up to 140s and 160s.  Tends to eat outside food with more sodium. Has been gaining weight.  Tells me he has been taking Lasix  20 mg 1 tablet in the morning and 1 in the afternoon.  Blood work from PCPs office 02/03/2024 notes hemoglobin 12.1, hematocrit 35.7, platelets 164 and WBC 3.5. Past Medical History:  Diagnosis Date   AKI (acute kidney injury) 02/03/2020   Amyloidosis, high suspicion, diagnosis not confirmed 10/18/2023   Ascending aorta dilatation 11/18/2023   Atrial fibrillation with RVR (HCC) 02/03/2020   Atrial flutter (HCC) 10/18/2023   Cardiac arrhythmia 11/18/2023   Chest pain with low risk for cardiac etiology 10/04/2013   CKD (chronic kidney disease), stage III (HCC)    COVID-19    DOE (dyspnea on exertion) 10/04/2013   High cholesterol    Hypertension    Leg swelling 10/05/2020   Memory changes 11/13/2023   NSVT (nonsustained ventricular tachycardia) (HCC) 03/17/2020   Orthostatic hypotension 03/17/2020   PAF (paroxysmal atrial fibrillation) (HCC) 03/17/2020   Prostate cancer (HCC)    PVD (peripheral vascular disease)    Stage 2 chronic kidney disease 11/18/2023   Syncope 02/03/2020   Thoracic aortic aneurysm without rupture 03/17/2020    Past Surgical History:  Procedure Laterality Date   COLONOSCOPY W/ POLYPECTOMY  2012   HERNIA REPAIR Right    Age 22   NM MYOVIEW  LTD  10/15/2013   7 min, 8.5 METs, 57%, ASx ST depressions - No ischemia / infarction.   THYROIDECTOMY     Age 42   TONSILLECTOMY     Age 2   TRANSTHORACIC ECHOCARDIOGRAM  10/28/2013   Nl LV Size & function; EF 65-70%, mod Conc LVH with Gd 2 DD    Current Medications: Current Meds  Medication Sig   albuterol  (VENTOLIN  HFA) 108 (90 Base) MCG/ACT inhaler Inhale 2 puffs into the lungs every 6 (six) hours as needed for  wheezing or shortness of breath.   apixaban  (ELIQUIS ) 5 MG TABS tablet Take 1 tablet (5 mg total) by mouth 2 (two) times daily.   aspirin  EC 81 MG tablet Take 81 mg by mouth daily. Swallow whole.   carvedilol  (COREG ) 12.5 MG tablet Take 1 tablet (12.5 mg total) by mouth 2 (two) times daily.   chlorthalidone (HYGROTON) 25 MG tablet Take 25 mg by mouth daily.   fluorometholone (FML) 0.1 % ophthalmic suspension Place 1 drop into both eyes 2 (two) times daily.   furosemide  (LASIX ) 20 MG tablet Take 1 tablet (20 mg total) by mouth daily. 2 tabs am and 1 tab pm   tamsulosin  (FLOMAX ) 0.4 MG CAPS capsule Take 1 capsule (0.4 mg total) by mouth daily after supper.   telmisartan (MICARDIS) 40 MG tablet Take 40 mg by mouth daily.   Vitamin D, Ergocalciferol, 50000 units CAPS Take 1 capsule by mouth once a week.   [DISCONTINUED] carvedilol  (COREG ) 6.25 MG tablet Take 1 tablet (6.25 mg total) by mouth 2 (two) times daily.     Allergies:   Rosuvastatin  and Shellfish allergy   Social History  Socioeconomic History   Marital status: Married    Spouse name: Not on file   Number of children: Not on file   Years of education: Not on file   Highest education level: Not on file  Occupational History   Not on file  Tobacco Use   Smoking status: Never   Smokeless tobacco: Never  Substance and Sexual Activity   Alcohol use: No   Drug use: No   Sexual activity: Not on file  Other Topics Concern   Not on file  Social History Narrative   He is remarried, currently living with his second wife he has total of 6 children; 3 live with them. (5 sons and one daughter) One grandchild. 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 He went to college. He never smoked and does not drink alcohol.   He is a native of [place].   Social Drivers of Corporate investment banker Strain: Not on file  Food Insecurity: Not on file  Transportation Needs: Not on file  Physical Activity: Not on file   Stress: Not on file  Social Connections: Unknown (09/26/2021)   [facility]   Social Network    Social Network: Not on file     Family History: The patient's family history includes Diabetes (age of onset: 65) in his sister; Heart failure (age of onset: 23) in his mother; Hypertension in his mother and sister. ROS:   Please see the history of present illness.    All 14 point review of systems negative except as described per history of present illness.  EKGs/Labs/Other Studies Reviewed:    The following studies were reviewed today:   EKG:       Recent Labs: 10/31/2023: ALT 31; Hemoglobin 12.2; Magnesium  1.9; NT-Pro BNP 2,958; Platelets 227 01/24/2024: BUN 20; Creatinine, Ser 1.22; Potassium 3.6; Sodium 140  Recent Lipid Panel No results found for: CHOL, TRIG, HDL, CHOLHDL, VLDL, LDLCALC, LDLDIRECT  Physical Exam:    VS:  BP (!) 164/88   Pulse 66   Ht 6' 5 (1.956 m)   Wt 241 lb 3.2 oz (109.4 kg)   SpO2 99%   BMI 28.60 kg/m     Wt Readings from Last 3 Encounters:  02/11/24 241 lb 3.2 oz (109.4 kg)  11/18/23 242 lb (109.8 kg)  10/18/23 251 lb 6.4 oz (114 kg)     GENERAL:  Well nourished, well developed in no acute distress NECK: No JVD; No carotid bruits CARDIAC: RRR, S1 and S2 present, no murmurs, no rubs, no gallops CHEST:  Clear to auscultation without rales, wheezing or rhonchi  Extremities: Trace bilateral pitting pedal edema. Pulses bilaterally symmetric with radial 2+ and dorsalis pedis 2+ NEUROLOGIC:  Alert and oriented x 3  Medication Adjustments/Labs and Tests Ordered: Current medicines are reviewed at length with the patient today.  Concerns regarding medicines are outlined above.  No orders of the defined types were placed in this encounter.  Meds ordered this encounter  Medications   carvedilol  (COREG ) 12.5 MG tablet    Sig: Take 1 tablet (12.5 mg total) by mouth 2 (two) times daily.    Dispense:  180 tablet    Refill:   3    Signed, Xyla Leisner reddy Nataki Mccrumb, MD, MPH, [facility]. 02/11/2024 11:18 AM    [facility]

## 2024-02-11 NOTE — Assessment & Plan Note (Signed)
 Suboptimal. Continue chlorthalidone 25 mg once daily Titrate up carvedilol  to 12.5 mg twice daily. Continue telmisartan 40 mg once daily.

## 2024-02-11 NOTE — Assessment & Plan Note (Signed)
 Chronic diastolic heart failure in the setting of cardiac amyloidosis. Educated about salt and fluid restriction to below 2 g/day and below 2 L/day respectively. Monitor daily weights. Titrate up Lasix  dose to 40 mg in the morning and 20 mg in the afternoon.

## 2024-02-11 NOTE — Assessment & Plan Note (Signed)
 Persistent atrial fibrillation on recent Zio patch monitor from June 2025.  Now on anticoagulation with Eliquis  5 mg twice daily. Tolerating well.  Remains asymptomatic. Continue carvedilol  for rate control along with blood pressure management.

## 2024-02-11 NOTE — Assessment & Plan Note (Signed)
 Cardiac amyloidosis confirmed based on workup with cardiac MRI and amyloidosis PYP scan.  MRI 12/17/2023 with septal hypertrophy 21 mm, LVEF 58%, diffuse LGE suggestive of cardiac amyloidosis. Mild RV hypokinesis with EF 41%.  Severe biatrial dilatation with amyloid involvement.  Trace pericardial effusion.  Mild aortic insufficiency.  Ascending aorta measuring 4 cm.  SPEP and UPEP showed no evidence of monoclonal protein.   Has pending appointment with cardiac speciality clinic with Dr. Arnetha in November. Anticipate being started on amyloid specific medications.

## 2024-02-18 DIAGNOSIS — I10 Essential (primary) hypertension: Secondary | ICD-10-CM | POA: Diagnosis not present

## 2024-02-18 DIAGNOSIS — Z713 Dietary counseling and surveillance: Secondary | ICD-10-CM | POA: Diagnosis not present

## 2024-03-16 ENCOUNTER — Encounter: Admitting: Internal Medicine

## 2024-03-16 ENCOUNTER — Ambulatory Visit: Attending: Genetic Counselor | Admitting: Genetic Counselor

## 2024-03-16 DIAGNOSIS — E854 Organ-limited amyloidosis: Secondary | ICD-10-CM

## 2024-03-17 NOTE — Progress Notes (Signed)
 Genetic Consult   Referral Reason  Kevin Merritt is referred for genetic testing of transthyretin gene to confirm Familial transthyretin amyloid.    Personal Medical Information Kevin Merritt (II.1 on pedigree) is a 73 y.o. retired African American gentleman, originally from [place] who used to work in office manager. He was diagnosed with prostate cancer after he retired and upon treatment underwent a cardiac evaluation including imaging studies that detected cardiac wall thickness suggestive of HCM.  A recent cardiac MRI (12/17/23) demonstrated septal hypertrophy with wall thickness of 2.1 cm, LVEF 585% and diffuse scar burden indicative of amyloidosis that was subsequently confirmed with PyP scan.   He reports having no functional limitations. Tells me of having carpal tunnel syndrome in his 27s. He also underwent bilateral cataract surgery at 7. Reports having tingling and numbness that starts in his head and his feet.  Family history  Relation to Proband Pedigree # Current age Heart condition/age of onset Notes  Sons, 5 III.1- III.5 45, 40, 35, 25, 21 None   Daughter III.6 25 None         Sisters, 3 II.2- II.4 73, 65, 61 None         Father I.1 Deceased None Died @ 60 - old age  Mother I.2 Deceased None Died @ 4s- pulmonary edema   Genetic Consultation Notes  I explained to the patient that amyloidosis can be caused by deposition of the amyloid protein in different tissues. There are different types of amyloidosis and includes the AL amyloidosis where the immunoglobulin light chain deposits in the heart, kidneys, liver and nerves and is usually associated with multiple myeloma or other vascular conditions.   The second type is called wild type ATTR amyloidosis or senile systemic amyloidosis where the native transthyretin protein deposits in the tissues, usually in the heart in individuals above the age of 70 and can also cause carpal tunnel syndrome.   The third type is the familial or hereditary  TTR amyloidosis (FTA) that is due to a mutation in TTR gene that destabilizes the protein causing it to misfold resulting in amyloid fibrils. These fibrils deposit in different tissues, such as the peripheral nervous system, brain and heart. Amyloid deposits in these tissues can lead to numbness in the lower limbs, feet and hands, stroke or left ventricular hypertrophy. He verbalized understanding of this. Treatment options are available to stabilize the TTR tetramer and were also briefly reviewed.   I explained to the patient that the familial transthyretin amyloidosis is an autosomal dominant condition with incomplete penetrance i.e. not all individuals harboring a mutation will present clinically in their early- mid adulthood, but will manifest as they age. Disease penetrance and variability in clinical expression varies widely between families and the prevalence of FTA differs greatly among the family members.  Since FTA is an autosomal dominant condition, first degree-relatives are at a 50% risk of inheriting this condition and should seek regular surveillance.  First-degree relatives include his son and sister. At this time his grandchildren do not need to undergo screening- they can be screened if they are symptomatic or if their parent is found to have the mutation in TTR gene.  Clinical screening of first-degree relatives is recommended based on current guidelines. Patient verbalized understanding of this. I discussed the outcomes of genetic testing. If his condition is due to a misfolded wild type transthyretin protein then, we can expect him to have a negative genetic result. If a mutation is not identified, then his first-degree relatives need  not undergo screening as he does not have the familial condition.  There is also the likelihood of identifying a "Variant of unknown significance." This result means that the variant has not been detected in a statistically significant number of patients  and/or functional studies have not been performed to verify its pathogenicity. This VUS can be tested in the family to see if it segregates with disease. If a VUS is found, first-degree relatives should undergo regular clinical screening for FTA. If a pathogenic variant is reported, then first-degree family members can get tested for this variant. If they test positive, it is likely they will develop FTA. In light of variable expression and incomplete penetrance associated with this condition, it is not possible to predict when they will manifest clinically. It is recommended that family members that test positive for the familial pathogenic variant pursue clinical screening for FTA. Family members that test negative for the familial mutation are deferred from further screening.  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 Genetic testing for the TTR gene implicated in FTA is highly recommended to confirm the genetic etiology of his condition. This genetic test will confirm his diagnosis to facilitate appropriate management strategies.  Importantly, identifying the causative pathogenic variant will enable cascade testing in the family so that appropriate cardiology surveillance and lifestyle management can be directed to the genotype-positive family members.   In addition, we discussed the protections afforded by the Genetic Information Non-Discrimination Act (GINA). I explained to the patient that GINA protects them from losing their employment or health insurance based on their genotype. However, these protections do not cover life insurance and disability. Explained to the patient that family members that are found to have the familial genetic mutation will be denied life insurance even if they are asymptomatic and do not exhibit clinical signs. He verbalized understanding.    Please note that the patient has not been counseled in this  visit on other personal, cultural, or ethical issues that they may face due to their heart condition.    Plan After a thorough discussion of the risk and benefits of genetic testing for FTA, Kevin Merritt defers genetic testing for now as he would like to discuss this with his wife. He will reach out to us  when he is ready to pursue testing.   Danford Pac, Ph.D, [facility] Clinical Molecular Geneticist

## 2024-03-25 DIAGNOSIS — C61 Malignant neoplasm of prostate: Secondary | ICD-10-CM | POA: Diagnosis not present

## 2024-03-30 DIAGNOSIS — E854 Organ-limited amyloidosis: Secondary | ICD-10-CM

## 2024-04-01 DIAGNOSIS — N401 Enlarged prostate with lower urinary tract symptoms: Secondary | ICD-10-CM | POA: Diagnosis not present

## 2024-04-01 DIAGNOSIS — R3912 Poor urinary stream: Secondary | ICD-10-CM | POA: Diagnosis not present

## 2024-04-01 DIAGNOSIS — C61 Malignant neoplasm of prostate: Secondary | ICD-10-CM | POA: Diagnosis not present

## 2024-04-01 DIAGNOSIS — N5201 Erectile dysfunction due to arterial insufficiency: Secondary | ICD-10-CM | POA: Diagnosis not present

## 2024-04-13 ENCOUNTER — Ambulatory Visit
Admission: RE | Admit: 2024-04-13 | Discharge: 2024-04-13 | Disposition: A | Source: Ambulatory Visit | Attending: Cardiology | Admitting: Cardiology

## 2024-04-13 DIAGNOSIS — I712 Thoracic aortic aneurysm, without rupture, unspecified: Secondary | ICD-10-CM

## 2024-04-13 DIAGNOSIS — I7121 Aneurysm of the ascending aorta, without rupture: Secondary | ICD-10-CM | POA: Diagnosis not present

## 2024-04-13 MED ORDER — IOPAMIDOL (ISOVUE-370) INJECTION 76%
75.0000 mL | Freq: Once | INTRAVENOUS | Status: AC | PRN
  Administered 2024-04-13: 75 mL via INTRAVENOUS

## 2024-04-16 ENCOUNTER — Other Ambulatory Visit: Payer: Self-pay

## 2024-04-16 DIAGNOSIS — I43 Cardiomyopathy in diseases classified elsewhere: Secondary | ICD-10-CM

## 2024-04-17 ENCOUNTER — Ambulatory Visit: Payer: Self-pay | Admitting: Cardiology

## 2024-04-17 DIAGNOSIS — E041 Nontoxic single thyroid nodule: Secondary | ICD-10-CM

## 2024-04-17 DIAGNOSIS — I77810 Thoracic aortic ectasia: Secondary | ICD-10-CM

## 2024-04-20 DIAGNOSIS — I10 Essential (primary) hypertension: Secondary | ICD-10-CM | POA: Diagnosis not present

## 2024-04-20 DIAGNOSIS — Z713 Dietary counseling and surveillance: Secondary | ICD-10-CM | POA: Diagnosis not present

## 2024-04-21 ENCOUNTER — Other Ambulatory Visit (HOSPITAL_BASED_OUTPATIENT_CLINIC_OR_DEPARTMENT_OTHER): Payer: Self-pay

## 2024-04-21 MED ORDER — BUTALBITAL-APAP-CAFFEINE 50-325-40 MG PO TABS
1.0000 | ORAL_TABLET | Freq: Three times a day (TID) | ORAL | 3 refills | Status: AC | PRN
  Filled 2024-04-21: qty 42, 14d supply, fill #0

## 2024-04-23 DIAGNOSIS — I5189 Other ill-defined heart diseases: Secondary | ICD-10-CM | POA: Diagnosis not present

## 2024-04-23 DIAGNOSIS — I480 Paroxysmal atrial fibrillation: Secondary | ICD-10-CM | POA: Diagnosis not present

## 2024-04-23 DIAGNOSIS — I10 Essential (primary) hypertension: Secondary | ICD-10-CM | POA: Diagnosis not present

## 2024-05-05 ENCOUNTER — Telehealth: Payer: Self-pay | Admitting: Cardiology

## 2024-05-05 NOTE — Telephone Encounter (Signed)
 Pt wife calling to get in contact with Dr. Fairy. Please advise.

## 2024-05-06 NOTE — Telephone Encounter (Signed)
 Called pt spouse expresses has not had any f/u from Dr. Fairy and would like to know results and the next steps.  Advised spouse message has been sent to Dr. Fairy to f/u.

## 2024-05-18 ENCOUNTER — Other Ambulatory Visit: Payer: Self-pay

## 2024-05-18 ENCOUNTER — Other Ambulatory Visit (HOSPITAL_COMMUNITY): Payer: Self-pay

## 2024-05-19 ENCOUNTER — Other Ambulatory Visit (HOSPITAL_BASED_OUTPATIENT_CLINIC_OR_DEPARTMENT_OTHER): Payer: Self-pay

## 2024-05-19 ENCOUNTER — Other Ambulatory Visit (HOSPITAL_COMMUNITY): Payer: Self-pay

## 2024-05-20 ENCOUNTER — Other Ambulatory Visit (HOSPITAL_COMMUNITY): Payer: Self-pay

## 2024-05-20 ENCOUNTER — Other Ambulatory Visit (HOSPITAL_BASED_OUTPATIENT_CLINIC_OR_DEPARTMENT_OTHER): Payer: Self-pay

## 2024-05-20 NOTE — Telephone Encounter (Signed)
 Left a message advising spouse of Dr. Venia response:   He did not pursue genetic testing as he wanted to discuss with his wife first. Advised spouse to call back with questions or concerns.

## 2024-05-21 ENCOUNTER — Other Ambulatory Visit (HOSPITAL_COMMUNITY): Payer: Self-pay

## 2024-05-21 ENCOUNTER — Telehealth: Payer: Self-pay | Admitting: Cardiology

## 2024-05-21 MED ORDER — CARVEDILOL 12.5 MG PO TABS
12.5000 mg | ORAL_TABLET | Freq: Two times a day (BID) | ORAL | 2 refills | Status: AC
  Filled 2024-05-21: qty 180, 90d supply, fill #0

## 2024-05-21 NOTE — Telephone Encounter (Signed)
" °*  STAT* If patient is at the pharmacy, call can be transferred to refill team.   1. Which medications need to be refilled? (please list name of each medication and dose if known) carvedilol  (COREG ) 12.5 MG tablet  2. Which pharmacy/location (including street and city if local pharmacy) is medication to be sent to? [facility]   3. Do they need a 30 day or 90 day supply?  90 day supply  Patient is requesting to have active prescription transferred to [facility] if possible. "

## 2024-05-21 NOTE — Telephone Encounter (Signed)
 Pt's medication was sent to pt's pharmacy as requested. Confirmation received.

## 2024-06-08 ENCOUNTER — Encounter (HOSPITAL_COMMUNITY): Payer: Self-pay

## 2024-06-08 ENCOUNTER — Ambulatory Visit (HOSPITAL_COMMUNITY): Admission: RE | Admit: 2024-06-08 | Source: Ambulatory Visit

## 2024-06-12 ENCOUNTER — Ambulatory Visit (HOSPITAL_COMMUNITY)
Admission: RE | Admit: 2024-06-12 | Discharge: 2024-06-12 | Disposition: A | Source: Ambulatory Visit | Attending: Cardiology | Admitting: Cardiology

## 2024-06-12 DIAGNOSIS — E041 Nontoxic single thyroid nodule: Secondary | ICD-10-CM | POA: Diagnosis present

## 2024-06-15 ENCOUNTER — Ambulatory Visit: Payer: Self-pay | Admitting: Cardiology

## 2024-06-17 ENCOUNTER — Other Ambulatory Visit (HOSPITAL_COMMUNITY): Payer: Self-pay

## 2024-06-18 ENCOUNTER — Ambulatory Visit: Admitting: Medical Genetics

## 2024-06-18 ENCOUNTER — Other Ambulatory Visit (HOSPITAL_COMMUNITY): Payer: Self-pay

## 2024-06-18 DIAGNOSIS — R55 Syncope and collapse: Secondary | ICD-10-CM

## 2024-06-18 DIAGNOSIS — N183 Chronic kidney disease, stage 3 unspecified: Secondary | ICD-10-CM

## 2024-06-18 DIAGNOSIS — E854 Organ-limited amyloidosis: Secondary | ICD-10-CM

## 2024-06-18 DIAGNOSIS — M7989 Other specified soft tissue disorders: Secondary | ICD-10-CM

## 2024-06-18 NOTE — Progress Notes (Signed)
 "   MEDICAL GENETICS NEW PATIENT EVALUATION  Patient name: Kevin Merritt DOB: Aug 29, 1950 Age: 74 y.o. MRN: [id number]  Referring Provider/Specialty: Alean Kobus, MD  Date of Evaluation: 06/18/2024 Chief Complaint/Reason for Referral: Hypertrophic cardiomyopathy  Assessment: We discussed with Kevin Merritt that his cardiac findings could be consistent with a diagnosis of TTR-related cardiac amyloidosis. This diagnosis could be confirmed through genetic testing of TTR. Kevin Merritt was interested in this being performed, and consent and samples were obtained for a sponsored TTR testing program through [facility]. Results are expected within 1 month, and we will contact him when the results are available. Additional recommendations will be made following the results of the testing.  Kevin Merritt also had concerns from a neurological perspective related to the right side of his body. He was interested in a referral to neurology for further evaluation, and a referral will be placed today.  Recommendations: TTR testing through [facility] (sponsored program) - results expected within 1 month. Neurology referral for mild findings on physical exam in the setting of amyloidosis. Continue follow up with current medical providers per their recommendations. Activities per cardiology recommendations.  Follow up will be based on the results of the testing.    HPI: Kevin Merritt is a 74 y.o. assigned male at birth who presents today for an initial genetics evaluation for hypertrophic cardiomyopathy. He is accompanied by his spouse, who provided the history. This information, along with a review of pertinent records, labs, and radiology studies, is summarized below.  Kevin Merritt has multiple medical concerns as noted below. He was initially seen by cardiology in 2015 for chest pain as a follow up to an ED visit that found him to have hypertension. A stress nuclear study  demonstrated asymptomatic ST depression with normal scintigraphic images and a hypertensive response to exercise. An echocardiogram showed moderate LVH. 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 In 2021, Kevin Merritt had a syncopal event with orthostasis likely due to active Covid infection). He was admitted and had a CT angiogram of the chest due to elevated D-dimer. This noted severe LVH, PAH, and ascending aortic aneurysm. An echo showed moderate concentric LVH with diastolic dysfunction. Kevin Merritt continued to be followed by cardiology and in 2025 an echo showed severe LVH. An amyloid study in 11/2023 showed findings suggestive of cardiac ATTR amyloidosis. A cardiac MRI was also performed and showed similar findings. He was then referred to genetics for further evaluation.  Kevin Merritt had a BMP in 01/2024 that was normal. He has not had any recent brain imaging or other neurological evaluation (e.g., EMG). He feels that the right side of his body 'doesn't work the same' as the left side of his body.  Past Medical History: Patient Active Problem List   Diagnosis Date Noted   Chronic diastolic heart failure (HCC) 02/11/2024   PVD (peripheral vascular disease)    Ascending aorta dilatation 11/18/2023   Cardiac arrhythmia 11/18/2023   Stage 2 chronic kidney disease 11/18/2023   Memory changes 11/13/2023   Atrial flutter (HCC) 10/18/2023   Cardiac amyloidosis (HCC) 10/18/2023   Leg swelling 10/05/2020   CKD (chronic kidney disease), stage III (HCC) 03/17/2020   Thoracic aortic aneurysm without rupture 03/17/2020   Orthostatic hypotension 03/17/2020   NSVT (nonsustained ventricular tachycardia) (HCC) 03/17/2020   Persistent atrial fibrillation (HCC) 03/17/2020   COVID-19    Syncope 02/03/2020   Atrial fibrillation with  RVR (HCC) 02/03/2020   AKI (acute kidney injury) 02/03/2020   Prostate cancer  (HCC) 03/10/2019   DOE (dyspnea on exertion) 10/04/2013   Hypertension 10/04/2013   Chest pain with low risk for cardiac etiology 10/04/2013   High cholesterol    Medications: Medications Ordered Prior to Encounter[1]  Allergies:  Allergies[2]  Review of Systems: Negative except as noted in the HPI  Family History: The family history was notable for the following:  - 5 sons and 1 daughter, alive and well.  - 3 sisters, alive and well.  - Father, deceased at 43 yo, no cardiac symptoms.  - Mother, deceased in her 19s, with pulmonary edema.  No other cardiac symptoms.   Mother's ethnicity: Mixed European, African American Father's ethnicity: African American Consangunity: Denies Please see the dentist note for additional information  Social History: Lives with spouse in [place]  Vitals: Weight: 233.3 lb Height: 6'5   Genetics Physical Exam:  Constitution: The patient is active and alert  Cardiac: No abnormalities detected in: cardiovascular system    Abnormal distal perfusion: normal distal perfusion    Irregular rate: heart rate regular    Irregular rhythm: regular rhythm    Murmur: no murmur  Lungs: No abnormalities detected in: pulmonary system, bilateral auscultation or effort  Neurological: No abnormalities detected in: antigravity movement of extremities, strength or facial movement (comments: Slightly increased reflexes in right lower extremity, increased sensation in right arm and leg compared to left (? if increased in right vs decreased in left))  Genitourinary: not assessed  Hair, Nails, and Skin: (comments: Edema in lower legs)  Extremities: No abnormalities detected in: extremities    Asymmetric girth: symmetric girth    Contractures: no joint contractures    Limited range of motion: non-limited ROM   Florita Nitsch Haldeman-Englert, MD [facility] Date: 06/18/2024 Time: 1000   Total time spent: 70 minutes Time spent  includes face to face and non-face to face care for the patient on the date of this encounter (history and physical, genetic counseling, coordination of care, data gathering and/or documentation as outlined).  Genetic counselor: Lum Molt, MS, CGC      [1]  Current Outpatient Medications on File Prior to Visit  Medication Sig Dispense Refill   albuterol  (VENTOLIN  HFA) 108 (90 Base) MCG/ACT inhaler Inhale 2 puffs into the lungs every 6 (six) hours as needed for wheezing or shortness of breath.     apixaban  (ELIQUIS ) 5 MG TABS tablet Take 1 tablet (5 mg total) by mouth 2 (two) times daily. 180 tablet 3   aspirin  EC 81 MG tablet Take 81 mg by mouth daily. Swallow whole.     butalbital -acetaminophen -caffeine  (FIORICET ) 50-325-40 MG tablet Take 1 tablet by mouth every 8 (eight) hours as needed for headache or eye pain. 42 tablet 3   carvedilol  (COREG ) 12.5 MG tablet Take 1 tablet (12.5 mg total) by mouth 2 (two) times daily. 180 tablet 2   chlorthalidone  (HYGROTON ) 25 MG tablet Take 25 mg by mouth daily.     fluorometholone  (FML) 0.1 % ophthalmic suspension Place 1 drop into both eyes 2 (two) times daily.     furosemide  (LASIX ) 20 MG tablet Take 1 tablet (20 mg total) by mouth daily. 2 tabs am and 1 tab pm 90 tablet 3   tamsulosin  (FLOMAX ) 0.4 MG CAPS capsule Take 1 capsule (0.4 mg total) by mouth daily after supper. 30 capsule 0   telmisartan  (MICARDIS ) 40 MG tablet Take 40 mg by mouth daily.  No current facility-administered medications on file prior to visit.  [2]  Allergies Allergen Reactions   Rosuvastatin      Other Reaction(s): memory changes   Shellfish Allergy Nausea And Vomiting   "

## 2024-06-18 NOTE — Progress Notes (Signed)
" °  °  GENETIC COUNSELING NEW PATIENT EVALUATION Patient name: Kevin Merritt DOB: [birth year] Age: 74 y.o. MRN: [id number]  Referring Provider/Specialty: Alean Kobus, MD  Date of Evaluation: 06/18/2024 Chief Complaint/Reason for Referral: Hypertrophic cardiomyopathy   Brief Summary: Kevin Merritt is a 74 y.o. 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 He is accompanied by his spouse at today's visit.  Prior genetic testing has not been performed.  Family History: See pedigree obtained during today's visit under History->Family->Pedigree.  The family history was notable for the following:  5 sons and 1 daughter, alive and well.  3 sisters, alive and well.  Father, deceased at 71 yo, no cardiac symptoms.  Mother, deceased in her 27s, with pulmonary edema.  No other cardiac symptoms.  Mother's ethnicity: Mixed European, African American Father's ethnicity: African American Consangunity: Denies  Prior Genetic testing: None  Genetic Counseling: Humbert Morozov is a 74 y.o. male with a personal history of hypertrophic cardiomyopathy with cardiac amyloid deposits.  For detailed HPI, please see accompanying note from Dr. Chad Haldeman Englert.  Kevin Merritt does not have a family history of any individuals with cardiac amyloidosis, hypertrophic cardiomyopathy, carpal tunnel syndrome, neuropathy, or other relevant symptoms.  We discussed that some individuals with hypertrophic cardiomyopathy and/or cardiac amyloidosis will have a genetic explanation for their symptoms.  If a genetic cause is identified, this may have important effects on medical management of their condition due to the availability of targeted therapies.  We reviewed that cardiac amyloidosis is commonly caused by changes to the TTR gene, whereas hypertrophic cardiomyopathy may have many different causes.  Kevin Merritt and his wife were interested in testing only  for TTR.  The results of this testing will have important impacts on his medical management, prognosis, and recurrence risk.  Verbal consent was provided after discussion of the types of results, expected cost and timeline, risks, benefits, and limitations.  A buccal sample was collection from Kevin Merritt to be sent to [facility] for the sponsored TTR gene sequencing.  Results are expected in 3-4 weeks, at which point we will reach out with further information.  Information about the [facility] was provided to the family for consideration.  Recommendations: [facility] TTR Gene Sequencing Continue follow-up with other healthcare providers  Date: 06/18/2024 Total time spent: 75 minutes Genetic Counselor-only time: 30 minutes  Time spent includes face to face and non-face to face care for the patient on the date of this encounter (history, genetic counseling, coordination of care, data gathering and/or documentation as outlined).   Genetic Counseling student present: Rosina Joesph Lum Joshua MS [facility] Certified Genetic Counselor [facility]  "

## 2024-06-19 ENCOUNTER — Other Ambulatory Visit (HOSPITAL_COMMUNITY): Payer: Self-pay

## 2024-06-19 MED ORDER — APIXABAN 5 MG PO TABS: 5.0000 mg | ORAL_TABLET | Freq: Two times a day (BID) | ORAL | 3 refills | Status: AC

## 2024-06-19 MED ORDER — FLUOROMETHOLONE 0.1 % OP SUSP
1.0000 [drp] | Freq: Two times a day (BID) | OPHTHALMIC | 12 refills | Status: AC
  Filled 2024-06-19: qty 10, 100d supply, fill #0

## 2024-06-19 MED ORDER — TELMISARTAN 40 MG PO TABS
40.0000 mg | ORAL_TABLET | Freq: Every day | ORAL | 3 refills | Status: AC
  Filled 2024-06-19: qty 90, 90d supply, fill #0

## 2024-06-19 MED ORDER — CARVEDILOL 12.5 MG PO TABS
12.5000 mg | ORAL_TABLET | Freq: Two times a day (BID) | ORAL | 3 refills | Status: AC
  Filled 2024-06-19: qty 180, 90d supply, fill #0

## 2024-06-19 MED ORDER — TAMSULOSIN HCL 0.4 MG PO CAPS
0.4000 mg | ORAL_CAPSULE | Freq: Two times a day (BID) | ORAL | 3 refills | Status: AC
  Filled 2024-06-19: qty 180, 90d supply, fill #0

## 2024-06-19 MED ORDER — FUROSEMIDE 40 MG PO TABS
ORAL_TABLET | ORAL | 3 refills | Status: AC
  Filled 2024-06-19: qty 270, 90d supply, fill #0

## 2024-06-19 MED ORDER — KETOROLAC TROMETHAMINE 0.5 % OP SOLN
1.0000 [drp] | Freq: Every day | OPHTHALMIC | 2 refills | Status: AC
  Filled 2024-06-19: qty 5, 100d supply, fill #0

## 2024-06-19 MED ORDER — CHLORTHALIDONE 25 MG PO TABS
25.0000 mg | ORAL_TABLET | Freq: Every morning | ORAL | 3 refills | Status: AC
  Filled 2024-06-19: qty 90, 90d supply, fill #0

## 2025-05-26 ENCOUNTER — Other Ambulatory Visit (HOSPITAL_COMMUNITY)
